# Patient Record
Sex: Male | Born: 1990 | Race: White | Hispanic: No | Marital: Married | Smoking: Former smoker
Health system: Southern US, Community
[De-identification: ages and names within clinical notes are randomized; demographics above are authoritative.]

## PROBLEM LIST (undated history)

## (undated) DIAGNOSIS — K4090 Unilateral inguinal hernia, without obstruction or gangrene, not specified as recurrent: Secondary | ICD-10-CM

## (undated) DIAGNOSIS — N400 Enlarged prostate without lower urinary tract symptoms: Secondary | ICD-10-CM

## (undated) DIAGNOSIS — T8859XA Other complications of anesthesia, initial encounter: Secondary | ICD-10-CM

## (undated) DIAGNOSIS — S43439A Superior glenoid labrum lesion of unspecified shoulder, initial encounter: Secondary | ICD-10-CM

## (undated) HISTORY — PX: HERNIA REPAIR: SHX51

---

## 2016-02-23 ENCOUNTER — Ambulatory Visit (HOSPITAL_COMMUNITY)
Admission: EM | Admit: 2016-02-23 | Discharge: 2016-02-23 | Disposition: A | Discharge status: Home / Self Care | Payer: BLUE CROSS/BLUE SHIELD | Attending: Emergency Medicine | Admitting: Emergency Medicine

## 2016-02-23 ENCOUNTER — Encounter (HOSPITAL_COMMUNITY): Payer: Self-pay | Admitting: Emergency Medicine

## 2016-02-23 DIAGNOSIS — S0181XA Laceration without foreign body of other part of head, initial encounter: Secondary | ICD-10-CM

## 2016-02-23 NOTE — Discharge Instructions (Signed)
Your wound has been closed with dermabond and steri strips. Monitor for signs and symptoms of infection. Keep the wound clean and dry, recommending leaving the bandage in place for at least the next 24 hours. The dermabond should peel off on its on in 5-7 days. Should you develop a fever, or swelling to the area, with notable redness follow up with your primary care provider or return to clinic.

## 2016-02-23 NOTE — ED Triage Notes (Signed)
Pt here for lac below the right eyebrow onset 45 min ago while playing basketball  Reports he was elbowed   Denies LOC  Bleeding controlled  A&O x4... NAD

## 2016-02-23 NOTE — ED Provider Notes (Signed)
CSN: [id number]     Arrival date & time 02/23/16  1402 History   First MD Initiated Contact with Patient 02/23/16 1515     No chief complaint on file.  (Consider location/radiation/quality/duration/timing/severity/associated sxs/prior Treatment) 26 year old male presents with chief complaint of a wound above his right eye. He reports he was playing basket ball and was struck by the elbow of another player. He had no LOC, no dizziness, no amnesia, no headache. Bleeding was controlled with direct pressure.   The history is provided by the patient.    History reviewed. No pertinent past medical history. History reviewed. No pertinent surgical history. History reviewed. No pertinent family history. Social History  Substance Use Topics  . Smoking status: Never Smoker  . Smokeless tobacco: Never Used  . Alcohol use Yes    Review of Systems  Reason unable to perform ROS: As covered in HPI.  All other systems reviewed and are negative.   Allergies  Patient has no known allergies.  Home Medications   Prior to Admission medications   Not on File   Meds Ordered and Administered this Visit  Medications - No data to display  BP 114/69 (BP Location: Left Arm)   Pulse 60   Temp 98.7 F (37.1 C) (Oral)   Resp 16   SpO2 100%  No data found.   Physical Exam  Constitutional: He is oriented to person, place, and time. He appears well-developed and well-nourished. No distress.  Neurological: He is alert and oriented to person, place, and time.  Skin: Skin is warm and dry. Capillary refill takes less than 2 seconds. Laceration noted. He is not diaphoretic.     Psychiatric: He has a normal mood and affect.  Nursing note and vitals reviewed.   Urgent Care Course     .Marland KitchenLaceration Repair Date/Time: 02/23/2016 3:44 PM Performed by: Dorena Bodo Authorized by: Charm Rings   Consent:    Consent obtained:  Verbal   Consent given by:  Patient   Risks discussed:  Infection,  need for additional repair, pain, poor cosmetic result, poor wound healing and vascular damage   Alternatives discussed:  No treatment, delayed treatment and observation Anesthesia (see MAR for exact dosages):    Anesthesia method:  None Laceration details:    Location:  Face   Face location:  R eyebrow   Length (cm):  0.8   Depth (mm):  0.2 Repair type:    Repair type:  Simple Exploration:    Hemostasis achieved with:  Direct pressure   Wound exploration: entire depth of wound probed and visualized     Wound extent: no areolar tissue violation noted, no fascia violation noted, no muscle damage noted and no vascular damage noted     Contaminated: no   Treatment:    Area cleansed with:  Saline   Amount of cleaning:  Standard   Irrigation solution:  Sterile saline   Irrigation volume:  10 ccs   Irrigation method:  Syringe   Visualized foreign bodies/material removed: no   Skin repair:    Repair method:  Tissue adhesive and Steri-Strips   Number of Steri-Strips:  2 Approximation:    Approximation:  Close   Vermilion border: well-aligned   Post-procedure details:    Dressing:  Non-adherent dressing   Patient tolerance of procedure:  Tolerated well, no immediate complications   (including critical care time)  Labs Review Labs Reviewed - No data to display  Imaging Review No results found.  Visual Acuity Review  Right Eye Distance:   Left Eye Distance:   Bilateral Distance:    Right Eye Near:   Left Eye Near:    Bilateral Near:         MDM   1. Laceration of periorbital area, initial encounter   Your wound has been closed with dermabond and steri strips. Monitor for signs and symptoms of infection. Keep the wound clean and dry, recommending leaving the bandage in place for at least the next 24 hours. The dermabond should peel off on its on in 5-7 days. Should you develop a fever, or swelling to the area, with notable redness follow up with your primary care  provider or return to clinic.     Dorena BodoLawrence Malaysia Crance, NP 02/23/16 1546

## 2017-09-21 DIAGNOSIS — M419 Scoliosis, unspecified: Secondary | ICD-10-CM | POA: Insufficient documentation

## 2018-05-29 DIAGNOSIS — N50819 Testicular pain, unspecified: Secondary | ICD-10-CM | POA: Diagnosis not present

## 2018-05-29 DIAGNOSIS — N451 Epididymitis: Secondary | ICD-10-CM | POA: Diagnosis not present

## 2018-06-06 ENCOUNTER — Ambulatory Visit (HOSPITAL_COMMUNITY)
Admission: EM | Admit: 2018-06-06 | Discharge: 2018-06-06 | Disposition: A | Discharge status: Home / Self Care | Payer: BLUE CROSS/BLUE SHIELD | Attending: Family Medicine | Admitting: Family Medicine

## 2018-06-06 ENCOUNTER — Encounter (HOSPITAL_COMMUNITY): Payer: Self-pay | Admitting: Emergency Medicine

## 2018-06-06 ENCOUNTER — Other Ambulatory Visit: Payer: Self-pay

## 2018-06-06 DIAGNOSIS — N50819 Testicular pain, unspecified: Secondary | ICD-10-CM | POA: Diagnosis not present

## 2018-06-06 DIAGNOSIS — N451 Epididymitis: Secondary | ICD-10-CM

## 2018-06-06 MED ORDER — LEVOFLOXACIN 500 MG PO TABS: 500.0000 mg | ORAL_TABLET | Freq: Every day | ORAL | 0 refills | Status: DC

## 2018-06-06 NOTE — ED Triage Notes (Signed)
Pt here for left testicle pain x 4 weeks; pt sts had urine tests that came back normal

## 2018-06-06 NOTE — Discharge Instructions (Addendum)
Stop the doxycycline Start Levaquin daily If not improved by early next week call urology If unable to see Urology, call here

## 2018-06-06 NOTE — ED Provider Notes (Signed)
[facility]    CSN: [id number] Arrival date & time: 06/06/18  1631     History   Chief Complaint Chief Complaint  Patient presents with  . Testicle Pain    HPI Seth Shields is a 28 y.o. male.   HPI  Pleasant 28 year old male.  Here for testicular pain.  He is an Pensions consultantattorney.  Married.  He states that his left testicle is been painful for about 4 weeks.  It hurts with any touching, or rubbing.  No dysuria or frequency.  No rash.  No penile discharge.  No question of STD.  He states he saw another physician 5 days ago who told him he had epididymitis and started him on doxycycline.  After 5 days he states his pain is worse.  No fever chills.  History reviewed. No pertinent past medical history.  There are no active problems to display for this patient.   History reviewed. No pertinent surgical history.     Home Medications    Prior to Admission medications   Medication Sig Start Date End Date Taking? Authorizing Provider  levofloxacin (LEVAQUIN) 500 MG tablet Take 1 tablet (500 mg total) by mouth daily. 06/06/18   Eustace MooreNelson, Marieanne Marxen Sue, MD    Family History History reviewed. No pertinent family history.  Social History Social History   Tobacco Use  . Smoking status: Never Smoker  . Smokeless tobacco: Never Used  Substance Use Topics  . Alcohol use: Yes  . Drug use: Not on file     Allergies   Patient has no known allergies.   Review of Systems Review of Systems  Constitutional: Negative for chills and fever.  HENT: Negative for ear pain and sore throat.   Eyes: Negative for pain and visual disturbance.  Respiratory: Negative for cough and shortness of breath.   Cardiovascular: Negative for chest pain and palpitations.  Gastrointestinal: Negative for abdominal pain and vomiting.  Genitourinary: Positive for testicular pain. Negative for dysuria, frequency and hematuria.  Musculoskeletal: Negative for arthralgias and back pain.  Skin: Negative for  color change and rash.  Neurological: Negative for seizures and syncope.  All other systems reviewed and are negative.    Physical Exam Triage Vital Signs ED Triage Vitals [06/06/18 1706]  Enc Vitals Group     BP 131/76     Pulse Rate 68     Resp 18     Temp 98.4 F (36.9 C)     Temp Source Oral     SpO2 99 %     Weight      Height      Head Circumference      Peak Flow      Pain Score 8     Pain Loc      Pain Edu?      Excl. in GC?    No data found.  Updated Vital Signs BP 131/76 (BP Location: Right Arm)   Pulse 68   Temp 98.4 F (36.9 C) (Oral)   Resp 18   SpO2 99%       Physical Exam Constitutional:      General: He is not in acute distress.    Appearance: He is well-developed.  HENT:     Head: Normocephalic and atraumatic.  Eyes:     Conjunctiva/sclera: Conjunctivae normal.     Pupils: Pupils are equal, round, and reactive to light.  Neck:     Musculoskeletal: Normal range of motion.  Cardiovascular:  Rate and Rhythm: Normal rate.  Pulmonary:     Effort: Pulmonary effort is normal. No respiratory distress.  Abdominal:     General: There is no distension.     Palpations: Abdomen is soft.  Genitourinary:    Penis: Normal.      Scrotum/Testes: Normal.     Comments: Very mild fullness in the upper pole of the left testicle and collecting tubules with no palpable swollen epididymis Musculoskeletal: Normal range of motion.  Skin:    General: Skin is warm and dry.  Neurological:     Mental Status: He is alert.      UC Treatments / Results  Labs (all labs ordered are listed, but only abnormal results are displayed) Labs Reviewed - No data to display  EKG None  Radiology No results found.  Procedures Procedures (including critical care time)  Medications Ordered in UC Medications - No data to display  Initial Impression / Assessment and Plan / UC Course  I have reviewed the triage vital signs and the nursing notes.  Pertinent labs  & imaging results that were available during my care of the patient were reviewed by me and considered in my medical decision making (see chart for details).     Testicle exam is without mass.  No concern for testicular cancer although this is discussed.  No hydrocele palpated.  Minimal epididymal tenderness.  Not responding to doxycycline.. Final Clinical Impressions(s) / UC Diagnoses   Final diagnoses:  Testicular pain  Epididymitis     Discharge Instructions     Stop the doxycycline Start Levaquin daily If not improved by early next week call urology If unable to see Urology, call here   ED Prescriptions    Medication Sig Dispense Auth. Provider   levofloxacin (LEVAQUIN) 500 MG tablet Take 1 tablet (500 mg total) by mouth daily. 10 tablet Eustace Moore, MD     Controlled Substance Prescriptions [place] Controlled Substance Registry consulted? No   Eustace Moore, MD 06/06/18 2042

## 2018-09-08 ENCOUNTER — Encounter: Payer: Self-pay | Admitting: Physician Assistant

## 2018-09-08 ENCOUNTER — Other Ambulatory Visit: Payer: Self-pay

## 2018-09-08 ENCOUNTER — Ambulatory Visit (INDEPENDENT_AMBULATORY_CARE_PROVIDER_SITE_OTHER): Payer: Managed Care, Other (non HMO) | Admitting: Physician Assistant

## 2018-09-08 ENCOUNTER — Other Ambulatory Visit: Payer: Managed Care, Other (non HMO)

## 2018-09-08 ENCOUNTER — Ambulatory Visit (INDEPENDENT_AMBULATORY_CARE_PROVIDER_SITE_OTHER): Payer: Managed Care, Other (non HMO)

## 2018-09-08 VITALS — BP 118/80 | HR 54 | Temp 98.1°F | Ht 72.0 in | Wt 188.0 lb

## 2018-09-08 DIAGNOSIS — R0789 Other chest pain: Secondary | ICD-10-CM | POA: Diagnosis not present

## 2018-09-08 DIAGNOSIS — R9389 Abnormal findings on diagnostic imaging of other specified body structures: Secondary | ICD-10-CM

## 2018-09-08 LAB — COMPREHENSIVE METABOLIC PANEL
ALT: 18 U/L (ref 0–53)
AST: 19 U/L (ref 0–37)
Albumin: 4.9 g/dL (ref 3.5–5.2)
Alkaline Phosphatase: 59 U/L (ref 39–117)
BUN: 17 mg/dL (ref 6–23)
CO2: 29 mEq/L (ref 19–32)
Calcium: 9.9 mg/dL (ref 8.4–10.5)
Chloride: 102 mEq/L (ref 96–112)
Creatinine, Ser: 1.15 mg/dL (ref 0.40–1.50)
GFR: 76.43 mL/min (ref >60.00)
Glucose, Bld: 95 mg/dL (ref 70–99)
Potassium: 5.1 mEq/L (ref 3.5–5.1)
Sodium: 139 mEq/L (ref 135–145)
Total Bilirubin: 0.5 mg/dL (ref 0.2–1.2)
Total Protein: 7.1 g/dL (ref 6.0–8.3)

## 2018-09-08 LAB — CBC WITH DIFFERENTIAL/PLATELET
Basophils Absolute: 0 10*3/uL (ref 0.0–0.1)
Basophils Relative: 0.9 % (ref 0.0–3.0)
Eosinophils Absolute: 0.1 10*3/uL (ref 0.0–0.7)
Eosinophils Relative: 2.4 % (ref 0.0–5.0)
HCT: 45 % (ref 39.0–52.0)
Hemoglobin: 15 g/dL (ref 13.0–17.0)
Lymphocytes Relative: 40 % (ref 12.0–46.0)
Lymphs Abs: 2 10*3/uL (ref 0.7–4.0)
MCHC: 33.3 g/dL (ref 30.0–36.0)
MCV: 89.6 fl (ref 78.0–100.0)
Monocytes Absolute: 0.5 10*3/uL (ref 0.1–1.0)
Monocytes Relative: 9 % (ref 3.0–12.0)
Neutro Abs: 2.4 10*3/uL (ref 1.4–7.7)
Neutrophils Relative %: 47.7 % (ref 43.0–77.0)
Platelets: 234 10*3/uL (ref 150.0–400.0)
RBC: 5.02 Mil/uL (ref 4.22–5.81)
RDW: 12.9 % (ref 11.5–15.5)
WBC: 5 10*3/uL (ref 4.0–10.5)

## 2018-09-08 NOTE — Patient Instructions (Signed)
It was great to meet you!  We will call you as soon as we can with when/where to get the CT scan.  We will also be in touch with your lab results.  Take care,  [name] PA-C

## 2018-09-08 NOTE — Progress Notes (Signed)
Seth BurdockRichard Sherrine Shields is a [age] y.o. male here to Establish Care.  I acted as a Neurosurgeonscribe for Energy East CorporationSamantha Cyntia Staley, PA-C [name], LPN  History of Present Illness:   Chief Complaint  Patient presents with  . Establish Care  . Discuss abnormal lung xrays    Abnormal xray -- patient was seen in the past two weeks by his chiropractor for back pain. They took some xrays and told him that he may have some nodules or other potential abnormalities in his lungs. He reports that he has some issues with feeling like some burning/discomfort when he is active, in his L lung. Denies chest pain or severe SOB. 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 He has since stopped smoking in Feb 2020. Does get night sweats x 1 year. Denies unintentional weight loss, but does feel like he's been more sedentary recently and because of that he has expected to gain weight but he hasn't.  Does have family history of liver cancer. He currently drinks around 6-8 drinks per week.  Health Maintenance: Immunizations -- UTD Colonoscopy -- N/A Mammogram -- N/A Bone Density -- N/A PSA -- N/A Weight -- Weight: 188 lb (85.3 kg)   Depression screen PHQ 2/9 09/08/2018  Decreased Interest 0  Down, Depressed, Hopeless 0  PHQ - 2 Score 0    No flowsheet data found.   Other providers/specialists: Patient Care Team: Seth Shields, [name], [place] as PCP - General (Physician Assistant)   History reviewed. No pertinent past medical history.   Social History   Socioeconomic History  . Marital status: Married    Spouse name: Not on file  . Number of children: Not on file  . Years of education: Not on file  . Highest education level: Not on file  Occupational History  . Not on file  Social Needs  . Financial resource strain: Not on file  . Food insecurity    Worry: Not on file    Inability: Not on file  . Transportation needs    Medical: Not on file    Non-medical: Not on file  Tobacco Use  .  Smoking status: Former Smoker    Packs/day: 0.25    Quit date: 03/31/2018    Years since quitting: 0.4  . Smokeless tobacco: Never Used  . Tobacco comment: x 10 months  Substance and Sexual Activity  . Alcohol use: Yes    Comment: 6/wk Beer and wine  . Drug use: Never  . Sexual activity: Yes  Lifestyle  . Physical activity    Days per week: Not on file    Minutes per session: Not on file  . Stress: Not on file  Relationships  . Social Musicianconnections    Talks on phone: Not on file    Gets together: Not on file    Attends religious service: Not on file    Active member of club or organization: Not on file    Attends meetings of clubs or organizations: Not on file    Relationship status: Not on file  . Intimate partner violence    Fear of current or ex partner: Not on file    Emotionally abused: Not on file    Physically abused: Not on file    Forced sexual activity: Not on file  Other Topics Concern  . Not on file  Social History Narrative  . Not on file    History reviewed. No pertinent surgical history.  Family History  Problem Relation Age of Onset  . Liver cancer Paternal Grandmother   . Liver cancer Paternal Grandfather   . Depression Sister        Manic Depression  . Heart disease Brother     No Known Allergies   Current Medications:   Current Outpatient Medications:  Marland Kitchen  Multiple Vitamin (MULTIVITAMIN) tablet, Take 1 tablet by mouth daily., Disp: , Rfl:    Review of Systems:   ROS  Negative unless otherwise specified per HPI.  Vitals:   Vitals:   09/08/18 0956  BP: 118/80  Pulse: (!) 54  Temp: 98.1 F (36.7 C)  TempSrc: Temporal  SpO2: 99%  Weight: 188 lb (85.3 kg)  Height: 6' (1.829 m)      Body mass index is 25.5 kg/m.  Physical Exam:   Physical Exam Vitals signs and nursing note reviewed.  Constitutional:      General: He is not in acute distress.    Appearance: He is well-developed. He is not ill-appearing or toxic-appearing.   Cardiovascular:     Rate and Rhythm: Regular rhythm. Bradycardia present.     Pulses: Normal pulses.     Heart sounds: Normal heart sounds, S1 normal and S2 normal.     Comments: No LE edema Pulmonary:     Effort: Pulmonary effort is normal.     Breath sounds: Normal breath sounds.  Skin:    General: Skin is warm and dry.  Neurological:     Mental Status: He is alert.     GCS: GCS eye subscore is 4. GCS verbal subscore is 5. GCS motor subscore is 6.  Psychiatric:        Speech: Speech normal.        Behavior: Behavior normal. Behavior is cooperative.     No results found for this or any previous visit.  Assessment and Plan:   [name] was seen today for establish care and discuss abnormal lung xrays.  Diagnoses and all orders for this visit:  Chest discomfort/Abnormal chest xray Unfortunately I am unable to see the formal CXR report from his chiropractor -- record request completed today. We did attempt CT scan of chest, but even when I attempted provider peer-to-peer, I was told by patient's insurance company that there was high call volume and return the call at a later, unspecified date. Will repeat CXR in the office, check CBC and CMP, and determine best plan after these results have returned.  . Reviewed expectations re: course of current medical issues. . Discussed self-management of symptoms. . Outlined signs and symptoms indicating need for more acute intervention. . Patient verbalized understanding and all questions were answered. . See orders for this visit as documented in the electronic medical record. . Patient received an After-Visit Summary.  CMA or LPN served as scribe during this visit. History, Physical, and Plan performed by medical provider. The above documentation has been reviewed and is accurate and complete.   [name], PA-C

## 2018-09-09 ENCOUNTER — Other Ambulatory Visit: Payer: Self-pay | Admitting: *Deleted

## 2018-09-09 ENCOUNTER — Telehealth: Payer: Self-pay | Admitting: Physician Assistant

## 2018-09-09 DIAGNOSIS — R9389 Abnormal findings on diagnostic imaging of other specified body structures: Secondary | ICD-10-CM

## 2018-09-09 DIAGNOSIS — R911 Solitary pulmonary nodule: Secondary | ICD-10-CM

## 2018-09-09 NOTE — Telephone Encounter (Signed)
JoEllen, please contact the patient to advise as you cancelled his appointment.   Copied from [place] [id number]. Topic: General - Other >> Sep 09, 2018 12:30 PM Leward Quan A wrote: Reason for CRM: Patient called to inquire as to why his appointment scheduled for 09/10/2018 was cancelled. He received a My Chart notification with no explanation. Please call patient at Ph# [phone]

## 2018-09-09 NOTE — Telephone Encounter (Signed)
See result notes. 

## 2018-09-09 NOTE — Progress Notes (Signed)
Referral put in Epic.   

## 2018-09-10 ENCOUNTER — Other Ambulatory Visit: Payer: Managed Care, Other (non HMO)

## 2018-09-10 NOTE — Telephone Encounter (Signed)
Called patient was aware spoke with Seth Shields yesterday

## 2018-10-02 ENCOUNTER — Encounter: Payer: Self-pay | Admitting: Emergency Medicine

## 2018-10-02 ENCOUNTER — Ambulatory Visit (INDEPENDENT_AMBULATORY_CARE_PROVIDER_SITE_OTHER): Payer: Managed Care, Other (non HMO) | Admitting: Emergency Medicine

## 2018-10-02 ENCOUNTER — Other Ambulatory Visit: Payer: Self-pay

## 2018-10-02 VITALS — BP 110/80 | HR 66 | Ht 72.0 in | Wt 187.0 lb

## 2018-10-02 DIAGNOSIS — R0602 Shortness of breath: Secondary | ICD-10-CM

## 2018-10-02 DIAGNOSIS — R911 Solitary pulmonary nodule: Secondary | ICD-10-CM | POA: Diagnosis not present

## 2018-10-02 DIAGNOSIS — R0600 Dyspnea, unspecified: Secondary | ICD-10-CM | POA: Insufficient documentation

## 2018-10-02 DIAGNOSIS — R918 Other nonspecific abnormal finding of lung field: Secondary | ICD-10-CM

## 2018-10-02 NOTE — Progress Notes (Signed)
Subjective:    Patient ID: Seth Shields, male    DOB: 1990/04/16, 28 y.o.   MRN: [id number]  HPI 28 year old man, minimal smoking history (<1 pk/yr), little other past medical history.  Referred today for evaluation of an abnormal CXR. 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 No wheeze, no trauma reported.  He has a hx L sided pectus deformity, also had some L sided rib fx's. He is able to exert. No cough or wheeze.   Review of Systems  Constitutional: Negative for fever and unexpected weight change.  HENT: Negative for congestion, dental problem, ear pain, nosebleeds, postnasal drip, rhinorrhea, sinus pressure, sneezing, sore throat and trouble swallowing.   Eyes: Negative for redness and itching.  Respiratory: Positive for shortness of breath. Negative for cough, chest tightness and wheezing.   Cardiovascular: Negative for palpitations and leg swelling.  Gastrointestinal: Negative for nausea and vomiting.  Genitourinary: Negative for dysuria.  Musculoskeletal: Negative for joint swelling.  Skin: Negative for rash.  Neurological: Negative for headaches.  Hematological: Does not bruise/bleed easily.  Psychiatric/Behavioral: Negative for dysphoric mood. The patient is not nervous/anxious.    No past medical history on file.   Family History  Problem Relation Age of Onset  . Liver cancer Paternal Grandmother   . Liver cancer Paternal Grandfather   . Depression Sister        Manic Depression  . Heart disease Brother      Social History   Socioeconomic History  . Marital status: Married    Spouse name: Not on file  . Number of children: Not on file  . Years of education: Not on file  . Highest education level: Not on file  Occupational History  . Not on file  Social Needs  . Financial resource strain: Not on file  . Food insecurity    Worry: Not on file    Inability: Not on file  . Transportation needs   Medical: Not on file    Non-medical: Not on file  Tobacco Use  . Smoking status: Former Smoker    Packs/day: 0.50    Years: 0.75    Pack years: 0.37    Quit date: 03/31/2018    Years since quitting: 0.5  . Smokeless tobacco: Never Used  Substance and Sexual Activity  . Alcohol use: Yes    Comment: 6/wk Beer and wine  . Drug use: Never  . Sexual activity: Yes  Lifestyle  . Physical activity    Days per week: Not on file    Minutes per session: Not on file  . Stress: Not on file  Relationships  . Social Herbalist on phone: Not on file    Gets together: Not on file    Attends religious service: Not on file    Active member of club or organization: Not on file    Attends meetings of clubs or organizations: Not on file    Relationship status: Not on file  . Intimate partner violence    Fear of current or ex partner: Not on file    Emotionally abused: Not on file    Physically abused: Not on file    Forced sexual activity: Not on file  Other Topics Concern  . Not on file  Social History Narrative  . Not on file   From [place], then to [facility].  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 No known TB exposure.   No Known Allergies   Outpatient Medications Prior to Visit  Medication Sig Dispense Refill  . Multiple Vitamin (MULTIVITAMIN) tablet Take 1 tablet by mouth daily.     No facility-administered medications prior to visit.         Objective:   Physical Exam Vitals:   10/02/18 1523  BP: 110/80  Pulse: 66  SpO2: 99%  Weight: 187 lb (84.8 kg)  Height: 6' (1.829 m)   Gen: Pleasant, thin man, in no distress,  normal affect  ENT: No lesions,  mouth clear,  oropharynx clear, no postnasal drip  Neck: No JVD, no stridor  Lungs: No use of accessory muscles, no crackles or wheezing on normal respiration, no wheeze on forced expiration  Cardiovascular: RRR, heart sounds normal, no murmur or gallops, no peripheral edema  Musculoskeletal: No  deformities, no cyanosis or clubbing  Neuro: alert, awake, non focal  Skin: Warm, no lesions or rash       Assessment & Plan:  Solitary pulmonary nodule 6 mm rounded left lower lobe nodule noted on chest x-ray with some associated hilar nodular calcification.  Suspect this is granulomatous disease.  Given its size I think it does need to be better characterized by CT.  He is a low risk patient, minimal tobacco history although he does have second and third-degree relatives who had lung cancer.  We may decide to follow with serial films if there is no clear evidence for calcification in the nodule itself.  Dyspnea He gives a good description of some left-sided restrictive physiology.  He does have a focal pectus deformity and had rib fractures on that side before.  He has never had dyspnea due to these findings.  I think he needs pulmonary function testing to better characterize, assess for possible occult obstruction.  Levy Pupaobert Dakai Braithwaite, MD, PhD 10/02/2018, 4:06 PM [facility] [phone] or if no answer [phone]

## 2018-10-02 NOTE — Assessment & Plan Note (Signed)
6 mm rounded left lower lobe nodule noted on chest x-ray with some associated hilar nodular calcification.  Suspect this is granulomatous disease.  Given its size I think it does need to be better characterized by CT.  He is a low risk patient, minimal tobacco history although he does have second and third-degree relatives who had lung cancer.  We may decide to follow with serial films if there is no clear evidence for calcification in the nodule itself.

## 2018-10-02 NOTE — Patient Instructions (Signed)
We will arrange for a CT scan of the chest with contrast to better evaluate your left lower lobe pulmonary nodule We will arrange for pulmonary function testing to further evaluate shortness of breath. Follow with Dr. Lamonte Sakai on the same day as your PFT to review the results together.

## 2018-10-02 NOTE — Assessment & Plan Note (Signed)
He gives a good description of some left-sided restrictive physiology.  He does have a focal pectus deformity and had rib fractures on that side before.  He has never had dyspnea due to these findings.  I think he needs pulmonary function testing to better characterize, assess for possible occult obstruction.

## 2018-10-24 ENCOUNTER — Other Ambulatory Visit: Payer: Managed Care, Other (non HMO)

## 2018-10-24 ENCOUNTER — Other Ambulatory Visit: Payer: Self-pay

## 2018-10-24 ENCOUNTER — Ambulatory Visit (INDEPENDENT_AMBULATORY_CARE_PROVIDER_SITE_OTHER)
Admission: RE | Admit: 2018-10-24 | Discharge: 2018-10-24 | Disposition: A | Discharge status: Home / Self Care | Payer: Managed Care, Other (non HMO) | Source: Ambulatory Visit | Attending: Emergency Medicine | Admitting: Emergency Medicine

## 2018-10-24 DIAGNOSIS — R918 Other nonspecific abnormal finding of lung field: Secondary | ICD-10-CM | POA: Diagnosis not present

## 2018-10-24 MED ORDER — IOHEXOL 300 MG/ML  SOLN
80.0000 mL | Freq: Once | INTRAMUSCULAR | Status: AC | PRN
  Administered 2018-10-24: 80 mL via INTRAVENOUS

## 2018-10-29 ENCOUNTER — Telehealth: Payer: Self-pay | Admitting: Emergency Medicine

## 2018-10-29 NOTE — Telephone Encounter (Signed)
Please let him know that his scan confirms some small scattered calcified granulomas which are benign. There are no worrisome pulmonary nodules or other findings - good news.

## 2018-10-29 NOTE — Telephone Encounter (Signed)
RB can you review CT results.

## 2018-10-29 NOTE — Telephone Encounter (Signed)
ATC pt regarding these results, line went to voicemail. I have left a detailed message per pt's DPR with these results and let pt know to give our office a call back if he has any additional questions or concerns. Nothing further needed at this time.

## 2018-11-13 ENCOUNTER — Other Ambulatory Visit: Payer: Self-pay | Admitting: *Deleted

## 2018-12-02 ENCOUNTER — Other Ambulatory Visit (HOSPITAL_COMMUNITY)
Admission: RE | Admit: 2018-12-02 | Discharge: 2018-12-02 | Disposition: A | Discharge status: Home / Self Care | Payer: Managed Care, Other (non HMO) | Source: Ambulatory Visit | Attending: Emergency Medicine | Admitting: Emergency Medicine

## 2018-12-02 DIAGNOSIS — Z01812 Encounter for preprocedural laboratory examination: Secondary | ICD-10-CM | POA: Insufficient documentation

## 2018-12-02 DIAGNOSIS — Z20828 Contact with and (suspected) exposure to other viral communicable diseases: Secondary | ICD-10-CM | POA: Insufficient documentation

## 2018-12-02 LAB — SARS CORONAVIRUS 2 (TAT 6-24 HRS): SARS Coronavirus 2: NEGATIVE

## 2018-12-03 ENCOUNTER — Telehealth: Payer: Self-pay | Admitting: Emergency Medicine

## 2018-12-03 ENCOUNTER — Other Ambulatory Visit: Payer: Self-pay

## 2018-12-03 ENCOUNTER — Ambulatory Visit (INDEPENDENT_AMBULATORY_CARE_PROVIDER_SITE_OTHER): Payer: Managed Care, Other (non HMO) | Admitting: Emergency Medicine

## 2018-12-03 DIAGNOSIS — R0600 Dyspnea, unspecified: Secondary | ICD-10-CM

## 2018-12-03 LAB — PULMONARY FUNCTION TEST
DL/VA % pred: 102 %
DL/VA: 5.11 ml/min/mmHg/L
DLCO unc % pred: 103 %
DLCO unc: 37.52 ml/min/mmHg
FEF 25-75 Post: 5.03 L/sec
FEF 25-75 Pre: 4.45 L/sec
FEF2575-%Change-Post: 12 %
FEF2575-%Pred-Post: 101 %
FEF2575-%Pred-Pre: 89 %
FEV1-%Change-Post: 5 %
FEV1-%Pred-Post: 101 %
FEV1-%Pred-Pre: 96 %
FEV1-Post: 5 L
FEV1-Pre: 4.76 L
FEV1FVC-%Change-Post: 2 %
FEV1FVC-%Pred-Pre: 97 %
FEV6-%Change-Post: 2 %
FEV6-%Pred-Post: 101 %
FEV6-%Pred-Pre: 98 %
FEV6-Post: 6.09 L
FEV6-Pre: 5.93 L
FEV6FVC-%Pred-Post: 100 %
FEV6FVC-%Pred-Pre: 100 %
FVC-%Change-Post: 2 %
FVC-%Pred-Post: 100 %
FVC-%Pred-Pre: 97 %
FVC-Post: 6.09 L
FVC-Pre: 5.93 L
Post FEV1/FVC ratio: 82 %
Post FEV6/FVC ratio: 100 %
Pre FEV1/FVC ratio: 80 %
Pre FEV6/FVC Ratio: 100 %
RV % pred: 69 %
RV: 1.18 L
TLC % pred: 89 %
TLC: 6.73 L

## 2018-12-03 NOTE — Telephone Encounter (Signed)
Spoke with patient. He stated that he has an appt for a PFT this afternoon at 4pm and wanted to know if his COVID test results came back. Advised him that his results were negative. Patient verbalized understanding and stated that he will be here for the PFT.   Nothing further needed at time of call.

## 2018-12-03 NOTE — Progress Notes (Signed)
Full PFT performed today. °

## 2019-01-12 ENCOUNTER — Encounter: Payer: Self-pay | Admitting: Physician Assistant

## 2019-01-13 ENCOUNTER — Encounter: Payer: Self-pay | Admitting: Physician Assistant

## 2019-01-13 ENCOUNTER — Ambulatory Visit (INDEPENDENT_AMBULATORY_CARE_PROVIDER_SITE_OTHER): Payer: Managed Care, Other (non HMO) | Admitting: Physician Assistant

## 2019-01-13 VITALS — Ht 72.0 in | Wt 185.0 lb

## 2019-01-13 DIAGNOSIS — R41840 Attention and concentration deficit: Secondary | ICD-10-CM

## 2019-01-13 NOTE — Progress Notes (Signed)
Virtual Visit via Video   I connected with Seth Shields on 01/13/19 at  7:40 AM EST by a video enabled telemedicine application and verified that I am speaking with the correct person using two identifiers. Location patient: Home Location provider: [facility], Office Persons participating in the virtual visit: Lin Glazier, [name] PA-C, Anselmo Pickler, LPN   I discussed the limitations of evaluation and management by telemedicine and the availability of in person appointments. The patient expressed understanding and agreed to proceed.  I acted as a Education administrator for [employer], [employer], LPN  Subjective:   HPI:  Discuss ADHD Pt would like to discuss the steps to get tested for ADHD and/or autism spectrum disorder? His wife is currently being screened for ADHD and was given a questionnaire as a part of that process. Looking at that questionnaire, he saw most of the prompts related to issues/symptoms that he is dealing with.   He feels as though he has some anxiety and inattentiveness as well as hyper focus.   ROS: See pertinent positives and negatives per HPI.  Patient Active Problem List   Diagnosis Date Noted  . Solitary pulmonary nodule 10/02/2018  . Dyspnea 10/02/2018    Social History   Tobacco Use  . Smoking status: Former Smoker    Packs/day: 0.50    Years: 0.75    Pack years: 0.37    Quit date: 03/31/2018    Years since quitting: 0.7  . Smokeless tobacco: Never Used  Substance Use Topics  . Alcohol use: Yes    Comment: 6/wk Beer and wine    Current Outpatient Medications:  Marland Kitchen  Multiple Vitamin (MULTIVITAMIN) tablet, Take 1 tablet by mouth daily., Disp: , Rfl:   No Known Allergies  Objective:   VITALS: Per patient if applicable, see vitals. GENERAL: Alert, appears well and in no acute distress. HEENT: Atraumatic, conjunctiva clear, no obvious abnormalities on inspection of external nose and ears. NECK: Normal movements of the head  and neck. CARDIOPULMONARY: No increased WOB. Speaking in clear sentences. I:E ratio WNL.  MS: Moves all visible extremities without noticeable abnormality. PSYCH: Pleasant and cooperative, well-groomed. Speech normal rate and rhythm. Affect is appropriate. Insight and judgement are appropriate. Attention is focused, linear, and appropriate.  NEURO: CN grossly intact. Oriented as arrived to appointment on time with no prompting. Moves both UE equally.  SKIN: No obvious lesions, wounds, erythema, or cyanosis noted on face or hands.  Assessment and Plan:   Udell was seen today for discuss adhd.  Diagnoses and all orders for this visit:  Attention or concentration deficit   Referral to Dr. Rachel Moulds at [facility] for further evaluation.  . Reviewed expectations re: course of current medical issues. . Discussed self-management of symptoms. . Outlined signs and symptoms indicating need for more acute intervention. . Patient verbalized understanding and all questions were answered. Marland Kitchen Health Maintenance issues including appropriate healthy diet, exercise, and smoking avoidance were discussed with patient. . See orders for this visit as documented in the electronic medical record.  I discussed the assessment and treatment plan with the patient. The patient was provided an opportunity to ask questions and all were answered. The patient agreed with the plan and demonstrated an understanding of the instructions.   The patient was advised to call back or seek an in-person evaluation if the symptoms worsen or if the condition fails to improve as anticipated.   CMA or LPN served as Education administrator during this  visit. History, Physical, and Plan performed by medical provider. The above documentation has been reviewed and is accurate and complete.  [place], [place] 01/13/2019

## 2019-01-16 ENCOUNTER — Ambulatory Visit: Payer: Managed Care, Other (non HMO) | Admitting: Physician Assistant

## 2019-01-23 ENCOUNTER — Telehealth: Payer: Managed Care, Other (non HMO) | Admitting: Physician Assistant

## 2019-01-23 DIAGNOSIS — Z20822 Contact with and (suspected) exposure to covid-19: Secondary | ICD-10-CM

## 2019-01-23 MED ORDER — ALBUTEROL SULFATE HFA 108 (90 BASE) MCG/ACT IN AERS: 2.0000 | INHALATION_SPRAY | RESPIRATORY_TRACT | 0 refills | Status: DC | PRN

## 2019-01-23 MED ORDER — BENZONATATE 100 MG PO CAPS
100.0000 mg | ORAL_CAPSULE | Freq: Three times a day (TID) | ORAL | 0 refills | Status: DC | PRN
Start: 2019-01-23 — End: 2019-06-05

## 2019-01-23 NOTE — Progress Notes (Signed)
Records reviewed.  Patient is under the care of pulmonology for dyspnea.  Does not appear that he has exertional dyspnea.  Questionnaire today does not indicate shortness of breath.  Will test for Covid and give strict return precautions.  E-Visit for Corona Virus Screening   Your current symptoms could be consistent with the coronavirus.  We will treat your symptoms.  It is imperative that you seek a face-to-face evaluation if your symptoms worsen including shortness of breath.  Many health care providers can now test patients at their office but not all are.  [place] has multiple testing sites. For information on our COVID testing locations and hours go to [email or web address]  We are enrolling you in our MyChart Home Monitoring for COVID19 . Daily you will receive a questionnaire within the MyChart website. Our COVID 19 response team will be monitoring your responses daily.  Testing Information: The COVID-19 Community Testing sites will begin testing BY APPOINTMENT ONLY.  You can schedule online at [email or web address]  If you do not have access to a smart phone or computer you may call [phone] for an appointment.  Testing Locations: Appointment schedule is 8 am to 3:30 pm at all sites  [facility] indoors at [address], [place] [address] [facility]  indoors at [facility] [address] [place], [place] [address] [place] indoors at [address] [place] [address]  Additional testing sites in the Community:  . For [facility] Testing sites in [facility]  FarmerBuys.com.au  . For Pop-up testing sites in [place]  [email or web address]-[email or web address]  . For Testing sites with regular hours [email or web address]  . For [facility] [email or web address]  . For [facility]  [email or web address].dk  . For [facility] testing in [place] and [employer] [email or web address].dk  . For [facility] testing in [facility]   [email or web address]  For  more information about community testing call [phone]   We are enrolling you in our MyChart Home Monitoring for COVID19 . Daily you will receive a questionnaire within the MyChart website. Our COVID 19 response team will be monitoring your responses daily.  Please quarantine yourself while awaiting your test results. If you develop fever/cough/breathlessness, please stay home for 10 days with improving symptoms and until you have had 24 hours of no fever (without taking a fever reducer).  You should wear a mask or cloth face covering over your nose and mouth if you must be around other people or animals, including pets (even at home). Try to stay at least 6 feet away from other people. This will protect the people around you.  Please continue good preventive care measures, including:  frequent hand-washing, avoid touching your face, cover coughs/sneezes, stay out of crowds and keep a 6 foot distance from others.  COVID-19 is a respiratory illness with symptoms that are similar to the flu. Symptoms are typically mild to moderate, but there have been cases of severe illness and death due to the virus.   The following symptoms may appear 2-14 days after exposure: . Fever . Cough . Shortness of breath or difficulty breathing . Chills . Repeated shaking with chills . Muscle pain . Headache . Sore throat . New loss of taste or smell . Fatigue . Congestion or runny nose . Nausea or vomiting . Diarrhea  Go to the nearest hospital ED for assessment if fever/cough/breathlessness are severe or illness seems like a threat to  life.  It is vitally important that if you feel  that you have an infection such as this virus or any other virus that you stay home and away from places where you may spread it to others.  You should avoid contact with people age 27 and older.   You can use medication such as A prescription cough medication called Tessalon Perles 100 mg. You may take 1-2 capsules every 8 hours as needed for cough and A prescription inhaler called Albuterol MDI 90 mcg /actuation 2 puffs every 4 hours as needed for shortness of breath, wheezing, cough  You may also take acetaminophen (Tylenol) as needed for fever.  Reduce your risk of any infection by using the same precautions used for avoiding the common cold or flu:  Marland Kitchen. Wash your hands often with soap and warm water for at least 20 seconds.  If soap and water are not readily available, use an alcohol-based hand sanitizer with at least 60% alcohol.  . If coughing or sneezing, cover your mouth and nose by coughing or sneezing into the elbow areas of your shirt or coat, into a tissue or into your sleeve (not your hands). . Avoid shaking hands with others and consider head nods or verbal greetings only. . Avoid touching your eyes, nose, or mouth with unwashed hands.  . Avoid close contact with people who are sick. . Avoid places or events with large numbers of people in one location, like concerts or sporting events. . Carefully consider travel plans you have or are making. . If you are planning any travel outside or inside the [place], visit the CDC's Travelers' Health webpage for the latest health notices. . If you have some symptoms but not all symptoms, continue to monitor at home and seek medical attention if your symptoms worsen. . If you are having a medical emergency, call 911.  HOME CARE . Only take medications as instructed by your medical team. . Drink plenty of fluids and get plenty of rest. . A steam or ultrasonic humidifier can help if you have congestion.    GET HELP RIGHT AWAY IF YOU HAVE EMERGENCY WARNING SIGNS** FOR COVID-19. If you or someone is showing any of these signs seek emergency medical care immediately. Call 911 or proceed to your closest emergency facility if: . You develop worsening high fever. . Trouble breathing . Bluish lips or face . Persistent pain or pressure in the chest . New confusion . Inability to wake or stay awake . You cough up blood. . Your symptoms become more severe  **This list is not all possible symptoms. Contact your medical provider for any symptoms that are sever or concerning to you.  MAKE SURE YOU   Understand these instructions.  Will watch your condition.  Will get help right away if you are not doing well or get worse.  Your e-visit answers were reviewed by a board certified advanced clinical practitioner to complete your personal care plan.  Depending on the condition, your plan could have included both over the counter or prescription medications.  If there is a problem please reply once you have received a response from your provider.  Your safety is important to us.  If you have drug allergies check your prescription carefully.    You can use MyChart to ask questions about today's visit, request a non-urgent call back, or ask for a work or school excuse for 24 hours related to this e-Visit. If it has been greater than 24 hours you will need to follow up with your provider, or  enter a new e-Visit to address those concerns. You will get an e-mail in the next two days asking about your experience.  I hope that your e-visit has been valuable and will speed your recovery. Thank you for using e-visits.   Greater than 5 minutes, yet less than 10 minutes of time have been spent researching, coordinating, and implementing care for this patient today

## 2019-04-23 ENCOUNTER — Ambulatory Visit: Payer: Managed Care, Other (non HMO) | Attending: Internal Medicine

## 2019-04-23 DIAGNOSIS — Z23 Encounter for immunization: Secondary | ICD-10-CM

## 2019-04-23 NOTE — Progress Notes (Signed)
   Covid-19 Vaccination Clinic  Name:  Seth Shields    MRN: [id number] DOB: Aug 03, 1990  04/23/2019  Mr. Bierlein was observed post Covid-19 immunization for 15 minutes without incident. He was provided with Vaccine Information Sheet and instruction to access the V-Safe system.   Mr. Holtmeyer was instructed to call 911 with any severe reactions post vaccine: Marland Kitchen Difficulty breathing  . Swelling of face and throat  . A fast heartbeat  . A bad rash all over body  . Dizziness and weakness   Immunizations Administered    Name Date Dose VIS Date Route   Pfizer COVID-19 Vaccine 04/23/2019 10:51 AM 0.3 mL 01/16/2019 Intramuscular   Manufacturer: [employer], [employer]   Lot: [id number]   NDC: [address]-3

## 2019-05-18 ENCOUNTER — Ambulatory Visit: Payer: Managed Care, Other (non HMO) | Attending: Internal Medicine

## 2019-05-18 DIAGNOSIS — Z23 Encounter for immunization: Secondary | ICD-10-CM

## 2019-05-18 NOTE — Progress Notes (Signed)
   Covid-19 Vaccination Clinic  Name:  Seth Shields    MRN: [id number] DOB: 20-Jun-1990  05/18/2019  Mr. [name] was observed post Covid-19 immunization for 15 minutes without incident. He was provided with Vaccine Information Sheet and instruction to access the V-Safe system.   Mr. [name] was instructed to call 911 with any severe reactions post vaccine: Marland Kitchen Difficulty breathing  . Swelling of face and throat  . A fast heartbeat  . A bad rash all over body  . Dizziness and weakness   Immunizations Administered    Name Date Dose VIS Date Route   Pfizer COVID-19 Vaccine 05/18/2019 10:38 AM 0.3 mL 01/16/2019 Intramuscular   Manufacturer: [employer], [employer]   Lot: [id number]   NDC: [address]-8

## 2019-05-27 ENCOUNTER — Encounter: Payer: Self-pay | Admitting: Physician Assistant

## 2019-06-05 ENCOUNTER — Encounter: Payer: Self-pay | Admitting: Physician Assistant

## 2019-06-05 ENCOUNTER — Ambulatory Visit (INDEPENDENT_AMBULATORY_CARE_PROVIDER_SITE_OTHER): Payer: Managed Care, Other (non HMO) | Admitting: Physician Assistant

## 2019-06-05 ENCOUNTER — Other Ambulatory Visit: Payer: Self-pay

## 2019-06-05 VITALS — BP 122/78 | HR 70 | Temp 98.7°F | Ht 72.0 in | Wt 189.0 lb

## 2019-06-05 DIAGNOSIS — R3589 Other polyuria: Secondary | ICD-10-CM

## 2019-06-05 DIAGNOSIS — M25551 Pain in right hip: Secondary | ICD-10-CM

## 2019-06-05 DIAGNOSIS — M545 Low back pain: Secondary | ICD-10-CM | POA: Diagnosis not present

## 2019-06-05 DIAGNOSIS — M25562 Pain in left knee: Secondary | ICD-10-CM

## 2019-06-05 DIAGNOSIS — R358 Other polyuria: Secondary | ICD-10-CM | POA: Diagnosis not present

## 2019-06-05 DIAGNOSIS — G8929 Other chronic pain: Secondary | ICD-10-CM

## 2019-06-05 LAB — COMPREHENSIVE METABOLIC PANEL
ALT: 19 U/L (ref 0–53)
AST: 25 U/L (ref 0–37)
Albumin: 4.9 g/dL (ref 3.5–5.2)
Alkaline Phosphatase: 55 U/L (ref 39–117)
BUN: 17 mg/dL (ref 6–23)
CO2: 28 mEq/L (ref 19–32)
Calcium: 9.7 mg/dL (ref 8.4–10.5)
Chloride: 105 mEq/L (ref 96–112)
Creatinine, Ser: 1.31 mg/dL (ref 0.40–1.50)
GFR: 65.4 mL/min (ref >60.00)
Glucose, Bld: 99 mg/dL (ref 70–99)
Potassium: 5.1 mEq/L (ref 3.5–5.1)
Sodium: 140 mEq/L (ref 135–145)
Total Bilirubin: 0.7 mg/dL (ref 0.2–1.2)
Total Protein: 7 g/dL (ref 6.0–8.3)

## 2019-06-05 LAB — CBC WITH DIFFERENTIAL/PLATELET
Basophils Absolute: 0.1 10*3/uL (ref 0.0–0.1)
Basophils Relative: 0.9 % (ref 0.0–3.0)
Eosinophils Absolute: 0 10*3/uL (ref 0.0–0.7)
Eosinophils Relative: 0.6 % (ref 0.0–5.0)
HCT: 41.5 % (ref 39.0–52.0)
Hemoglobin: 14 g/dL (ref 13.0–17.0)
Lymphocytes Relative: 32.2 % (ref 12.0–46.0)
Lymphs Abs: 2.4 10*3/uL (ref 0.7–4.0)
MCHC: 33.7 g/dL (ref 30.0–36.0)
MCV: 88.8 fl (ref 78.0–100.0)
Monocytes Absolute: 0.6 10*3/uL (ref 0.1–1.0)
Monocytes Relative: 8.5 % (ref 3.0–12.0)
Neutro Abs: 4.2 10*3/uL (ref 1.4–7.7)
Neutrophils Relative %: 57.8 % (ref 43.0–77.0)
Platelets: 289 10*3/uL (ref 150.0–400.0)
RBC: 4.67 Mil/uL (ref 4.22–5.81)
RDW: 12.8 % (ref 11.5–15.5)
WBC: 7.3 10*3/uL (ref 4.0–10.5)

## 2019-06-05 LAB — URINALYSIS, ROUTINE W REFLEX MICROSCOPIC
Bilirubin Urine: NEGATIVE
Hgb urine dipstick: NEGATIVE
Ketones, ur: NEGATIVE
Leukocytes,Ua: NEGATIVE
Nitrite: NEGATIVE
RBC / HPF: NONE SEEN (ref 0–?)
Specific Gravity, Urine: 1.02 (ref 1.000–1.030)
Total Protein, Urine: NEGATIVE
Urine Glucose: NEGATIVE
Urobilinogen, UA: 0.2 (ref 0.0–1.0)
WBC, UA: NONE SEEN (ref 0–?)
pH: 8 (ref 5.0–8.0)

## 2019-06-05 LAB — TSH: TSH: 2.53 u[IU]/mL (ref 0.35–4.50)

## 2019-06-05 NOTE — Patient Instructions (Signed)
It was great to see you!  A referral has been placed for you to see Dr. [name] or Dr. Terrilee Files with [facility]. Someone from there office will be in touch soon regarding your appointment with him. His location: Psychiatrist Medicine at [facility] [address] on the 1st floor.   Phone number [phone], Fax [phone].  This location is across the street from the entrance to [employer] and in the same complex as the [facility] and [facility] [facility]  I will be in touch via MyChart regarding your labs and urine results.  Due to recent changes in healthcare laws, you may see the results of your imaging and laboratory studies on MyChart before your provider has had a chance to review them.  We understand that in some cases there may be results that are confusing or concerning to you. Not all laboratory results come back in the same time frame and the provider may be waiting for multiple results in order to interpret others.  Please give [place] 48 hours in order for your provider to thoroughly review all the results before contacting the office for clarification of your results.   Take care,  Jarold Motto PA-C

## 2019-06-05 NOTE — Progress Notes (Signed)
Seth Shields is a 29 y.o. male here for a new problem.  I acted as a Neurosurgeon for [employer], PA-C [employer], [place]  History of Present Illness:   Chief Complaint  Patient presents with  . Back Pain  . 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 Hx of hypermobile joints. All below MSK issues improve with activity and worsen with lack of activity.  Back pain Pt c/o of mid back pain on the right side with tightness that goes up into shoulder and neck. Seen chiropractor in the past a few times for adjustments, but symptoms will return.   Knee Pain Pt has to pop left knee to relief pain -- pops 7-10 times day. No prior L knee injury. Will feel tightness/tension in his L knee if it hasn't popped or needs to be popped. Denies L lower leg numbness/tingling.  Hip pain R hip pain. Loud popping sensation. Feels like his R hip higher than L hip. Has tried chiropractor treatment for this without improvement. When squatting feels like his R hip may give out. R glute/hamstring also feel very tight. States that his R hip and knee are more flexible than the L but they also feel more unstable. Does get numbness/tingling of his R lower leg if it falls asleep (which L lower leg does not experience.)   Motrin prn.  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 Drinks a lot of water throughout the day, drank 150 oz of fluids yesterday. Has tried to increase his fluids due to stimulant rx. Feels really thirsty throughout the day despite adequate hydration. Denies family hx of DM.   Urinary frequency has been worsening in the past 3 months. Urinates in the middle of the night only because his dog wakes him up regularly around 3a. After he urinates he feels like he has to soon urinate again, as if he didn't empty his bladder all the way. Denies blood in urination. Prior hx of kidney stones -- at age 38 and then 2.  On average  drinks 1 ETOH serving per day, denies any excessive intake. Denies incontinence or unintentional weight changes.   History reviewed. No pertinent past medical history.   Social History   Socioeconomic History  . Marital status: Married    Spouse name: Not on file  . Number of children: Not on file  . Years of education: Not on file  . Highest education level: Not on file  Occupational History  . Not on file  Tobacco Use  . Smoking status: Former Smoker    Packs/day: 0.50    Years: 0.75    Pack years: 0.37    Quit date: 03/31/2018    Years since quitting: 1.1  . Smokeless tobacco: Never Used  Substance and Sexual Activity  . Alcohol use: Yes    Comment: 6/wk Beer and wine  . Drug use: Never  . Sexual activity: Yes  Other Topics Concern  . Not on file  Social History Narrative  . Not on file   Social Determinants of Health   Financial Resource Strain:   . Difficulty of Paying Living Expenses:   Food Insecurity:   . Worried About Programme researcher, broadcasting/film/video in the Last Year:   . Barista in the Last Year:   Transportation Needs:   . Freight forwarder (Medical):   Marland Kitchen Lack of Transportation (Non-Medical):  Physical Activity:   . Days of Exercise per Week:   . Minutes of Exercise per Session:   Stress:   . Feeling of Stress :   Social Connections:   . Frequency of Communication with Friends and Family:   . Frequency of Social Gatherings with Friends and Family:   . Attends Religious Services:   . Active Member of Clubs or Organizations:   . Attends Banker Meetings:   Marland Kitchen Marital Status:   Intimate Partner Violence:   . Fear of Current or Ex-Partner:   . Emotionally Abused:   Marland Kitchen Physically Abused:   . Sexually Abused:     History reviewed. No pertinent surgical history.  Family History  Problem Relation Age of Onset  . Liver cancer Paternal Grandmother   . Liver cancer Paternal Grandfather   . Depression Sister        Manic Depression  .  Heart disease Brother     No Known Allergies  Current Medications:   Current Outpatient Medications:  .  Amphetamine ER (ADZENYS XR-ODT) 18.8 MG TBED, Take by mouth daily., Disp: , Rfl:  .  Biotin 1 MG CAPS, Take by mouth., Disp: , Rfl:  .  Ferrous Gluconate (IRON 27 PO), Take by mouth., Disp: , Rfl:  .  Multiple Vitamin (MULTIVITAMIN) tablet, Take 1 tablet by mouth daily., Disp: , Rfl:    Review of Systems:   ROS  Negative unless otherwise specified per HPI.  Vitals:   Vitals:   06/05/19 0910  BP: 122/78  Pulse: 70  Temp: 98.7 F (37.1 C)  TempSrc: Temporal  SpO2: 99%  Weight: 189 lb (85.7 kg)  Height: 6' (1.829 m)     Body mass index is 25.63 kg/m.  Physical Exam:   Physical Exam Vitals and nursing note reviewed.  Constitutional:      General: He is not in acute distress.    Appearance: He is well-developed. He is not ill-appearing or toxic-appearing.  Cardiovascular:     Rate and Rhythm: Normal rate and regular rhythm.     Pulses: Normal pulses.     Heart sounds: Normal heart sounds, S1 normal and S2 normal.     Comments: No LE edema Pulmonary:     Effort: Pulmonary effort is normal.     Breath sounds: Normal breath sounds.  Musculoskeletal:     Comments: Back exam: No decreased ROM 2/2 pain with flexion/extension, lateral side bends, or rotation. Reproducible tenderness with deep palpation to bilateral paraspinal muscles in upper lumbar/lower thoracic area.   L knee exam: FROM; no obvious swelling or deformity  R hip exam: Decreased FROM with extension of R leg (compared to L)   Skin:    General: Skin is warm and dry.  Neurological:     Mental Status: He is alert.     GCS: GCS eye subscore is 4. GCS verbal subscore is 5. GCS motor subscore is 6.  Psychiatric:        Speech: Speech normal.        Behavior: Behavior normal. Behavior is cooperative.       Assessment and Plan:   Jkai was seen today for back pain and knee pain.  Diagnoses  and all orders for this visit:  Polyuria Unclear etiology. He is drinking a significant amount of water. Will check labs and urine for organic etiology of symptoms. Follow-up based upon lab results. Consider urology referral. -     CBC with Differential/Platelet -  Comprehensive metabolic panel -     TSH -     Urinalysis, Routine w reflex microscopic -     Urine Culture  Chronic bilateral low back pain without sciatica; Right hip pain; Left knee pain He has not had great success with chiropractic treatment. Will refer to sports medicine for further evaluation and management. No red flags on exam. Will defer imaging to specialist. -     Ambulatory referral to Sports Medicine  . Reviewed expectations re: course of current medical issues. . Discussed self-management of symptoms. . Outlined signs and symptoms indicating need for more acute intervention. . Patient verbalized understanding and all questions were answered. . See orders for this visit as documented in the electronic medical record. . Patient received an After-Visit Summary.  CMA or LPN served as scribe during this visit. History, Physical, and Plan performed by medical provider. The above documentation has been reviewed and is accurate and complete.  [name], PA-C

## 2019-06-06 LAB — URINE CULTURE
MICRO NUMBER:: 10425944
Result:: NO GROWTH
SPECIMEN QUALITY:: ADEQUATE

## 2019-06-11 ENCOUNTER — Encounter: Payer: Self-pay | Admitting: Family Medicine

## 2019-06-11 ENCOUNTER — Ambulatory Visit (INDEPENDENT_AMBULATORY_CARE_PROVIDER_SITE_OTHER): Payer: Managed Care, Other (non HMO) | Admitting: Family Medicine

## 2019-06-11 ENCOUNTER — Other Ambulatory Visit: Payer: Self-pay

## 2019-06-11 VITALS — BP 132/70 | HR 87 | Ht 72.0 in | Wt 189.0 lb

## 2019-06-11 DIAGNOSIS — M25562 Pain in left knee: Secondary | ICD-10-CM

## 2019-06-11 DIAGNOSIS — M7061 Trochanteric bursitis, right hip: Secondary | ICD-10-CM | POA: Diagnosis not present

## 2019-06-11 DIAGNOSIS — M546 Pain in thoracic spine: Secondary | ICD-10-CM

## 2019-06-11 DIAGNOSIS — G8929 Other chronic pain: Secondary | ICD-10-CM

## 2019-06-11 MED ORDER — PENNSAID 2 % EX SOLN: 2.0000 g | Freq: Two times a day (BID) | CUTANEOUS | 3 refills | Status: DC

## 2019-06-11 NOTE — Progress Notes (Signed)
Subjective:    I'm seeing this patient as a consultation for:  Rinaldo Cloud, [place]. Note will be routed back to referring provider/PCP.  CC: R hip, mid-back and L knee pain  I, Kana Thompson, LAT, ATC, am serving as scribe for Dr. [name].  HPI: Pt is a 29 y/o male presenting w/ multiple c/o including R hip,, right shoulder mid-back and L knee pain. Patient states these issues are chronic. States he has had no success with the chiropractor. Back and shoulder is most painful today. States he does get numbness in the right arm if the shoulder is tight. Sometimes he has numbness in the right leg as well.   R hip: Hamstring, lateral, glut  -Mechanical symptoms: yes -Aggravating factors: squats that cause feeling of instability, stretching (glut).  -Treatments tried: chiropractor  Mid-back: Pain radiating to neck and R shoulder -Aggravating factors: no stretching  -Treatments tried: chiropractor   L knee: Pt reports feeling pressure/tension in his L knee and feeling that his L knee needs to pop on consistent basis and when it pops he feels relief in terms of L knee pain.  No MOI. No pain just a lot of popping.  -Swelling: no swelling. -Mechanical symptoms: Yes and knee pain relieved when L knee pops -Aggravating factors:  -Treatments tried:  Past medical history, Surgical history, Family history, Social history, Allergies, and medications have been entered into the medical record, reviewed.   Review of Systems: No new headache, visual changes, nausea, vomiting, diarrhea, constipation, dizziness, abdominal pain, skin rash, fevers, chills, night sweats, weight loss, swollen lymph nodes, body aches, joint swelling, muscle aches, chest pain, shortness of breath, mood changes, visual or auditory hallucinations.   Objective:    Vitals:   06/11/19 0830  BP: 132/70  Pulse: 87  SpO2: 98%   General: Well Developed, well nourished, and in no acute distress.   MSK:  C-spine  normal-appearing nontender normal cervical motion. Upper extremity strength reflexes and sensation are intact. C-spine normal-appearing not particular tender palpation midline. Tender palpation right thoracic paraspinal musculature inferior to rhomboid. Normal scapular motion. L-spine nontender midline.  Right hip normal motion. Tender palpation greater trochanter. Nontender otherwise. Hip abduction strength diminished 4/5 with mild pain. Hip external rotation strength diminished 4/5 without significant pain. Internal rotation and adduction strength intact.  Left hip nontender normal motion. Hip abduction strength and external rotation strength slightly diminished 4/5-4+/5. Internal rotation adduction strength intact.  Left knee no effusion. Range of motion 0-120 degrees with minimal crepitation. Not particular tender to palpation. Stable ligamentous exam. Negative McMurray's test.  Right knee no effusion normal range of motion with tiny crepitation. Nontender. Stable ligamentous exam. Negative Murray's test.  Lab and Radiology Results X-ray images left knee ordered today and will be done at a later time and date.  Impression and Recommendations:    Assessment and Plan: 29 y.o. male with  Thoracic back pain ongoing for months or longer.  Pain focused at thoracic paraspinal musculature.  Patient reports that he had x-rays of his T-spine about a year ago at the chiropractor.  Doubtful that he has any acute findings that we will see on x-ray today.  Pain due to paraspinal muscle spasm and dysfunction.  Plan for physical therapy heating pad and TENS unit.    Recheck 6 weeks.  Right hip pain: Tender at greater trochanter with weak hip abductors external rotators.  Hip abductor tendinopathy versus trochanteric bursitis.  Plan for home exercise program today and physical  therapy referral.  Recheck 6 weeks.  Left knee pain: Less clear etiology.  X-ray ordered but not obtained by  patient.  Trial of Pennsaid and a bit of physical therapy.  Recheck 6 weeks.   PDMP not reviewed this encounter. Orders Placed This Encounter  Procedures  . DG Knee AP/LAT W/Sunrise Left    Standing Status:   Future    Standing Expiration Date:   08/10/2020    Order Specific Question:   Reason for Exam (SYMPTOM  OR DIAGNOSIS REQUIRED)    Answer:   eval left knee pain    Order Specific Question:   Preferred imaging location?    Answer:   [facility]    Order Specific Question:   Radiology Contrast Protocol - do NOT remove file path    Answer:   [email or web address]  . Ambulatory referral to Physical Therapy    Referral Priority:   Routine    Referral Type:   Physical Medicine    Referral Reason:   Specialty Services Required    Requested Specialty:   Physical Therapy   Meds ordered this encounter  Medications  . DISCONTD: Diclofenac Sodium (PENNSAID) 2 % SOLN    Sig: Place 2 g onto the skin 2 (two) times daily.    Dispense:  112 g    Refill:  3  . Diclofenac Sodium (PENNSAID) 2 % SOLN    Sig: Place 2 g onto the skin 2 (two) times daily.    Dispense:  112 g    Refill:  3    [phone]    Discussed warning signs or symptoms. Please see discharge instructions. Patient expresses understanding.   The above documentation has been reviewed and is accurate and complete Lynne Leader

## 2019-06-11 NOTE — Patient Instructions (Signed)
Thank you for coming in today. Attend PT.  Use heat and TENS unit.  Pennsaid for the knee.  Recheck 6 weeks.   TENS UNIT: This is helpful for muscle pain and spasm.   Search and Purchase a TENS 7000 2nd edition at  www.tenspros.com or www.Amazon.com It should be less than $30.     TENS unit instructions: Do not shower or bathe with the unit on Turn the unit off before removing electrodes or batteries If the electrodes lose stickiness add a drop of water to the electrodes after they are disconnected from the unit and place on plastic sheet. If you continued to have difficulty, call the TENS unit company to purchase more electrodes. Do not apply lotion on the skin area prior to use. Make sure the skin is clean and dry as this will help prolong the life of the electrodes. After use, always check skin for unusual red areas, rash or other skin difficulties. If there are any skin problems, does not apply electrodes to the same area. Never remove the electrodes from the unit by pulling the wires. Do not use the TENS unit or electrodes other than as directed. Do not change electrode placement without consultating your therapist or physician. Keep 2 fingers with between each electrode. Wear time ratio is 2:1, on to off times.    For example on for 30 minutes off for 15 minutes and then on for 30 minutes off for 15 minutes

## 2019-06-29 ENCOUNTER — Ambulatory Visit: Payer: Managed Care, Other (non HMO) | Attending: Family Medicine

## 2019-06-29 ENCOUNTER — Other Ambulatory Visit: Payer: Self-pay

## 2019-06-29 DIAGNOSIS — M25551 Pain in right hip: Secondary | ICD-10-CM | POA: Diagnosis present

## 2019-06-29 DIAGNOSIS — M545 Low back pain: Secondary | ICD-10-CM | POA: Diagnosis present

## 2019-06-29 DIAGNOSIS — M25562 Pain in left knee: Secondary | ICD-10-CM

## 2019-06-29 DIAGNOSIS — M62830 Muscle spasm of back: Secondary | ICD-10-CM | POA: Diagnosis present

## 2019-06-29 DIAGNOSIS — R293 Abnormal posture: Secondary | ICD-10-CM | POA: Diagnosis present

## 2019-06-29 DIAGNOSIS — G8929 Other chronic pain: Secondary | ICD-10-CM | POA: Diagnosis present

## 2019-06-29 NOTE — Patient Instructions (Signed)
PEC supine exercise with PPT hamstring recruitment and  LT leg lift  With hold 5 breaths 5 reps 2x/day

## 2019-06-29 NOTE — Addendum Note (Signed)
Addended by: Caprice Red on: 06/29/2019 01:26 PM   Modules accepted: Orders

## 2019-06-29 NOTE — Therapy (Signed)
[place], [place], [address] Phone: [phone]   Fax:  [phone]  Physical Therapy Evaluation  Patient Details  Name: Seth Shields MRN: [id number] Date of Birth: Oct 03, 1990 Referring Provider (PT): Lynne Leader, MD   Encounter Date: 06/29/2019  PT End of Session - 06/29/19 1230    Visit Number  1    Number of Visits  12    Date for PT Re-Evaluation  08/07/19    PT Start Time  1220    PT Stop Time  1300    PT Time Calculation (min)  40 min    Activity Tolerance  Patient tolerated treatment well    Behavior During Therapy  [facility] for tasks assessed/performed       History reviewed. No pertinent past medical history.  History reviewed. No pertinent surgical history.  There were no vitals filed for this visit.   Subjective Assessment - 06/29/19 1223    Subjective  He reports multiple areas of pain. No injury. He does band exercises issued from chiropractor    Limitations  --   working out   How long can you sit comfortably?  < 2 hours    How long can you stand comfortably?  2 hours    Patient Stated Goals  He wants to decr pain and improvem flexibility    Currently in Pain?  Yes    Pain Location  Back    Pain Orientation  Right;Mid    Pain Descriptors / Indicators  Aching;Tightness    Pain Type  Chronic pain    Pain Radiating Towards  to neck    Pain Onset  More than a month ago    Pain Frequency  Intermittent   stretching lose  but returns quickly.in hour   Aggravating Factors   static positons    Pain Relieving Factors  stretching ,  activity    Multiple Pain Sites  No    Pain Score  0    Pain Location  Hip         [facility] PT Assessment - 06/29/19 0001      Assessment   Medical Diagnosis  knee back and hip pain    Referring Provider (PT)  Lynne Leader, MD    Onset Date/Surgical Date  --   moe than a year   Next MD Visit  a couple of weeks    Prior Therapy  chiropractic care       Precautions   Precautions  None      Restrictions   Weight Bearing Restrictions  No      Balance Screen   Has the patient fallen in the past 6 months  No      Prior Function   Level of Independence  Independent      Cognition   Overall Cognitive Status  Within Functional Limits for tasks assessed      Posture/Postural Control   Posture Comments  Lt sholder slight elevated n  more posterior  sway back       ROM / Strength   AROM / PROM / Strength  AROM;PROM;Strength      AROM   AROM Assessment Site  Lumbar    Lumbar Flexion  80    Lumbar Extension  full     Lumbar - Right Side Bend  20    Lumbar - Left Side Bend  25      PROM   Overall PROM Comments  RT hip  IR 20 degrees   LT 30   LT hip ER   40 degrees   RT 80      Strength   Overall Strength Comments  4/5 RTR scAPULA RETRACTION.     Strength Assessment Site  Knee;Hip    Right/Left Hip  Right;Left    Right/Left Knee  Right;Left      Flexibility   Soft Tissue Assessment /Muscle Length  yes    Hamstrings  LT 55   RT 50 degrees  and bilateral hip ext decr    Quadriceps  Lt quad 15 degrees less in prone       ITB  LT -  RT ? +      Palpation   Spinal mobility  good mobility  with PA glides    Palpation comment  RT ribs falred                   Objective measurements completed on examination: See above findings.              PT Education - 06/29/19 1229    Education Details  POC, HEP    Person(s) Educated  Patient    Methods  Explanation;Tactile cues;Verbal cues;Handout    Comprehension  Verbalized understanding;Returned demonstration       PT Short Term Goals - 06/29/19 1318      PT SHORT TERM GOAL #1   Title  He will be independent with inital HEP    Time  3    Period  Weeks    Status  New      PT SHORT TERM GOAL #2   Title  He will report pain in back decr  25% or more    Time  3    Period  Weeks    Status  Revised        PT Long Term Goals - 06/29/19 1318      PT LONG  TERM GOAL #1   Title  He will be independent with all HEP issued    Time  6    Period  Weeks    Status  New      PT LONG TERM GOAL #2   Title  He will report pain in back as intermittant    Time  6    Period  Weeks    Status  New      PT LONG TERM GOAL #3   Title  He will report pain  in local area with out radiation to other areas of spine/neck    Time  6    Period  Weeks    Status  New      PT LONG TERM GOAL #4   Title  He will report able to stand and sit without pain    Time  6    Period  Weeks    Status  New             Plan - 06/29/19 1311    Clinical Impression Statement  Mr Seth Shields presents with chronic RT sided back pain and intermittant RT hip pain . He did not report any knee pain.    He demo some asyymetry with flared RT rib cage   Lt hip ER tightness , LT quad tightness , swayback posture , LT scapula more posterior RT sidebending decr.   He was only mildly tender today on RT  paraspinals.    He should benefit from skilled PT and a  consistent HEP.    Personal Factors and Comorbidities  Time since onset of injury/illness/exacerbation    Examination-Activity Limitations  Sit;Stand    Examination-Participation Restrictions  Community Activity    Stability/Clinical Decision Making  Stable/Uncomplicated    Clinical Decision Making  Low    Rehab Potential  Good    PT Treatment/Interventions  Dry needling;Passive range of motion;Patient/family education;Manual techniques;Therapeutic exercise;Therapeutic activities;Electrical Stimulation;Moist Heat    PT Next Visit Plan  check squat, review HEP .,  initiate stretching for HEP, modalities if needed    PT Home Exercise Plan  PEC supine exercise with LT leg lift    Consulted and Agree with Plan of Care  Patient       Patient will benefit from skilled therapeutic intervention in order to improve the following deficits and impairments:  Pain, Postural dysfunction, Increased muscle spasms, Decreased range of  motion  Visit Diagnosis: Left knee pain, unspecified chronicity  Chronic right-sided low back pain without sciatica  Pain in right hip  Abnormal posture  Muscle spasm of back     Problem List Patient Active Problem List   Diagnosis Date Noted  . Solitary pulmonary nodule 10/02/2018  . Dyspnea 10/02/2018  . Scoliosis deformity of spine 09/21/2017    Caprice Red  PT 06/29/2019, 1:21 PM  [facility] [address] [place], [place], [address] Phone: [phone]   Fax:  [phone]  Name: Seth Shields MRN: [id number] Date of Birth: [birth year]

## 2019-07-14 ENCOUNTER — Ambulatory Visit: Payer: Managed Care, Other (non HMO) | Attending: Family Medicine

## 2019-07-14 ENCOUNTER — Other Ambulatory Visit: Payer: Self-pay

## 2019-07-14 DIAGNOSIS — M25551 Pain in right hip: Secondary | ICD-10-CM | POA: Diagnosis present

## 2019-07-14 DIAGNOSIS — G8929 Other chronic pain: Secondary | ICD-10-CM | POA: Insufficient documentation

## 2019-07-14 DIAGNOSIS — M62830 Muscle spasm of back: Secondary | ICD-10-CM

## 2019-07-14 DIAGNOSIS — M545 Low back pain, unspecified: Secondary | ICD-10-CM

## 2019-07-14 DIAGNOSIS — R293 Abnormal posture: Secondary | ICD-10-CM | POA: Diagnosis present

## 2019-07-14 NOTE — Patient Instructions (Signed)
Full squat with reach and deep breathing 1-2x/day  10 sec or 5 breaths 5 reps

## 2019-07-14 NOTE — Therapy (Addendum)
[place], [place], [address] Phone: [phone]   Fax:  [phone]  Physical Therapy Treatment/Dishcharge  Patient Details  Name: Seth Shields MRN: [id number] Date of Birth: July 10, 1990 Referring Provider (PT): Lynne Leader, MD   Encounter Date: 07/14/2019  PT End of Session - 07/14/19 1219    Visit Number  2    Number of Visits  12    Date for PT Re-Evaluation  08/07/19    PT Start Time  1219    PT Stop Time  1300    PT Time Calculation (min)  41 min    Activity Tolerance  Patient tolerated treatment well    Behavior During Therapy  [facility] for tasks assessed/performed       History reviewed. No pertinent past medical history.  History reviewed. No pertinent surgical history.  There were no vitals filed for this visit.  Subjective Assessment - 07/14/19 1219    Subjective  mid back tightness no pain. Exercises done without problem.    Currently in Pain?  No/denies                        [facility] Adult PT Treatment/Exercise - 07/14/19 0001      Exercises   Exercises  Lumbar      Lumbar Exercises: Stretches   Quadruped Mid Back Stretch  2 reps;20 seconds    Quadruped Mid Back Stretch Limitations  flexed the side reach RT and LT.       Lumbar Exercises: Aerobic   Nustep  L5 5 min UE and LE      Lumbar Exercises: Standing   Other Standing Lumbar Exercises  flexion stretch with sidebend at freemotion standing  stretching with deep breathing   with full squat and reach      Lumbar Exercises: Prone   Other Prone Lumbar Exercises  scap retract with arm extensioln and over head lifts x 12 reps      Manual Therapy   Manual Therapy  Soft tissue mobilization;Joint mobilization    Joint Mobilization  Gr 3-4  PA glides thoracic and  lumbar and rotational mobs to lower thoracic spine             PT Education - 07/14/19 1306    Education Details  HEP    Person(s) Educated  Patient     Methods  Explanation;Demonstration;Tactile cues;Verbal cues;Handout    Comprehension  Returned demonstration;Verbalized understanding       PT Short Term Goals - 07/14/19 1309      PT SHORT TERM GOAL #1   Title  He will be independent with inital HEP    Status  Achieved        PT Long Term Goals - 06/29/19 1318      PT LONG TERM GOAL #1   Title  He will be independent with all HEP issued    Time  6    Period  Weeks    Status  New      PT LONG TERM GOAL #2   Title  He will report pain in back as intermittant    Time  6    Period  Weeks    Status  New      PT LONG TERM GOAL #3   Title  He will report pain  in local area with out radiation to other areas of spine/neck    Time  6    Period  Weeks    Status  New      PT LONG TERM GOAL #4   Title  He will report able to stand and sit without pain    Time  6    Period  Weeks    Status  New            Plan - 07/14/19 1307    Clinical Impression Statement  He is able to do all HEP and he reports frequently doing foam roller and  quadraped stretching .Marland Kitchen  No change so far.   Will prgress core strength maybe some bands    PT Treatment/Interventions  Dry needling;Passive range of motion;Patient/family education;Manual techniques;Therapeutic exercise;Therapeutic activities;Electrical Stimulation;Moist Heat    PT Next Visit Plan  check squat/ review HEP ., , modalities if needed, core strength    PT Home Exercise Plan  PEC supine exercise with LT leg lift, full squat with reaching    Consulted and Agree with Plan of Care  Patient       Patient will benefit from skilled therapeutic intervention in order to improve the following deficits and impairments:  Pain, Postural dysfunction, Increased muscle spasms, Decreased range of motion  Visit Diagnosis: Chronic right-sided low back pain without sciatica  Pain in right hip  Abnormal posture  Muscle spasm of back     Problem List Patient Active Problem List    Diagnosis Date Noted  . Solitary pulmonary nodule 10/02/2018  . Dyspnea 10/02/2018  . Scoliosis deformity of spine 09/21/2017    Seth Shields  PT 07/14/2019, 1:24 PM  [facility] [address] [place], [place], [address] Phone: [phone]   Fax:  [phone]  Name: Seth Shields MRN: [id number] Date of Birth: 30-Sep-1990  PHYSICAL THERAPY DISCHARGE SUMMARY  Visits from Start of Care: 2  Current functional level related to goals / functional outcomes: Mr Hymas called to cancel all appointments as work schedule limits ability to come in and he stated his back was feeling better.    Remaining deficits: Cointnined with LBP but improved   Education / Equipment: EP Plan: Patient agrees to discharge.  Patient goals were not met. Patient is being discharged due to the patient's request.  ?????    Seth Shields PT  07/20/19

## 2019-07-16 ENCOUNTER — Ambulatory Visit: Payer: Managed Care, Other (non HMO) | Admitting: Physical Therapy

## 2019-07-21 ENCOUNTER — Ambulatory Visit: Payer: Managed Care, Other (non HMO) | Admitting: Physical Therapy

## 2019-07-23 ENCOUNTER — Ambulatory Visit: Payer: Managed Care, Other (non HMO) | Admitting: Family Medicine

## 2019-07-23 ENCOUNTER — Ambulatory Visit: Payer: Managed Care, Other (non HMO)

## 2019-08-04 ENCOUNTER — Encounter: Payer: Managed Care, Other (non HMO) | Admitting: Physical Therapy

## 2019-08-06 ENCOUNTER — Encounter: Payer: Managed Care, Other (non HMO) | Admitting: Physical Therapy

## 2019-10-06 IMAGING — CT CT CHEST W/ CM
2 of 3 series · 15 of 36 positions shown, 18 images · IV contrast (omnipaque)
Comparison: Chest radiograph 09/08/2018.

CLINICAL DATA: Lung nodule.

EXAM:
CT CHEST WITH CONTRAST
TECHNIQUE: Multidetector CT imaging of the chest was performed during
intravenous contrast administration.
CONTRAST:  80mL OMNIPAQUE IOHEXOL 300 MG/ML  SOLN

[Series 2: thorax · axial · 0.76mm/px · z∈[-377,-63]mm · 12 of 185 slices shown, 15 images]
[im 14/185  mediastinal]
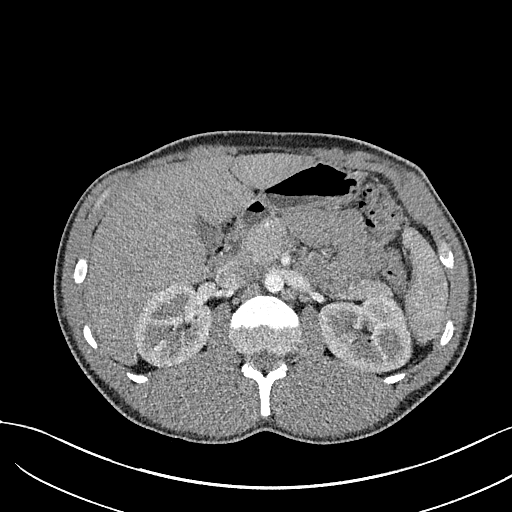
[im 14/185  lung]
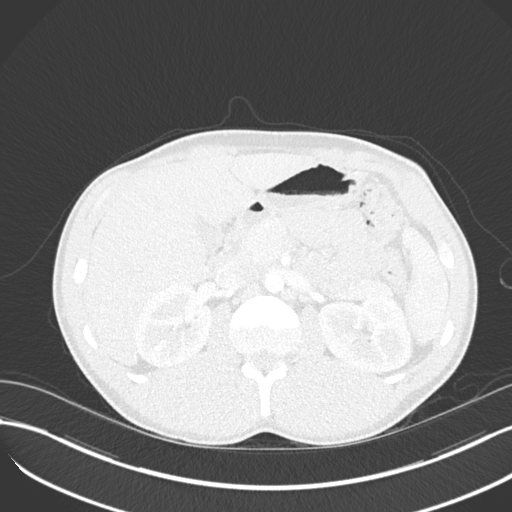
[im 28/185  lung]
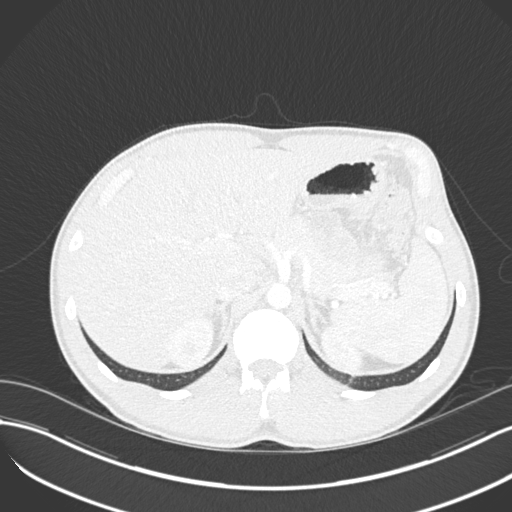
[im 41/185  lung]
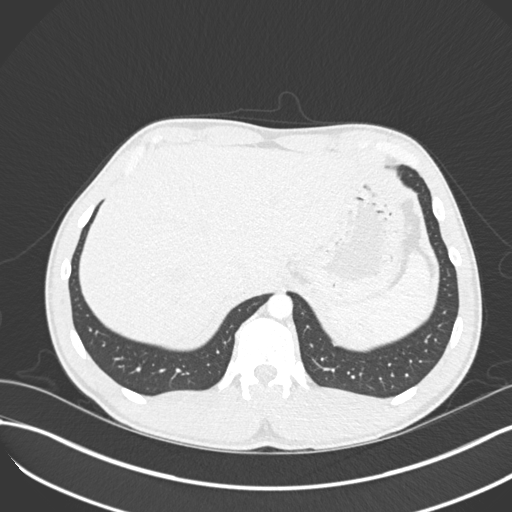
[im 55/185  lung]
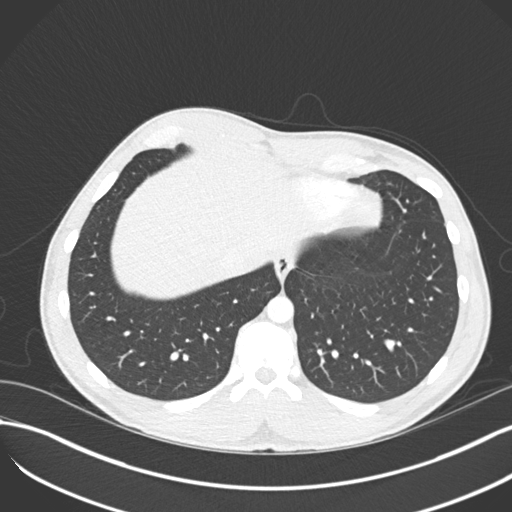
[im 69/185  mediastinal]
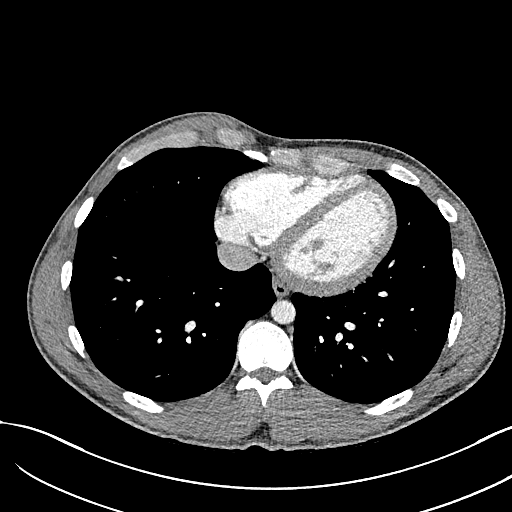
[im 69/185  lung]
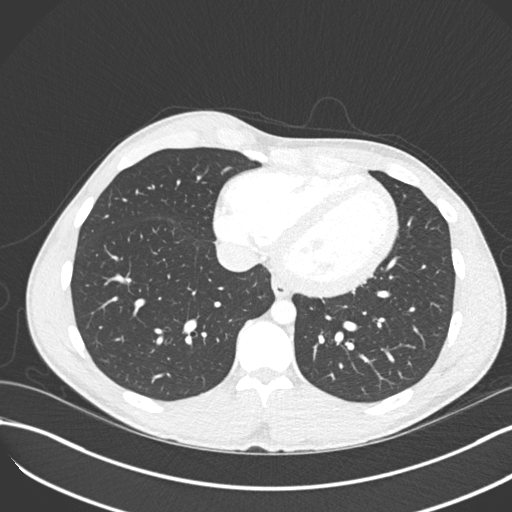
[im 82/185  lung]
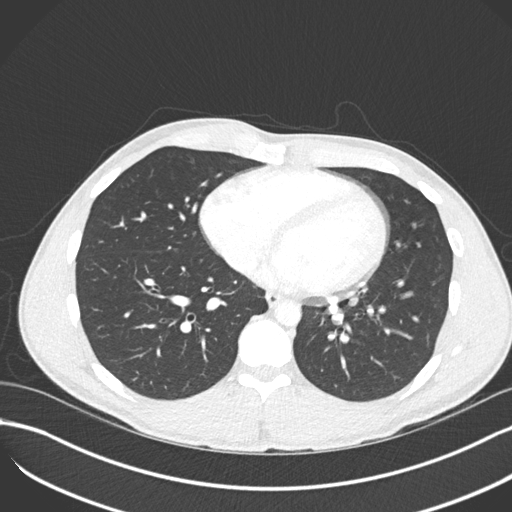
[im 103/185  lung]
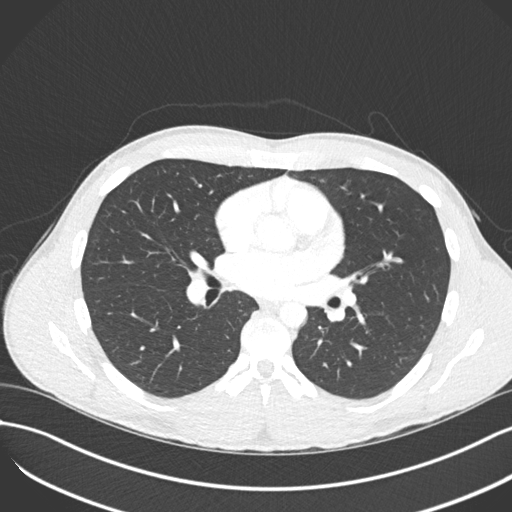
[im 116/185  lung]
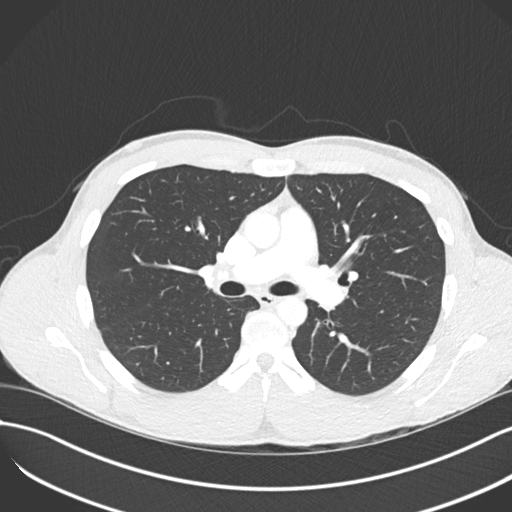
[im 130/185  mediastinal]
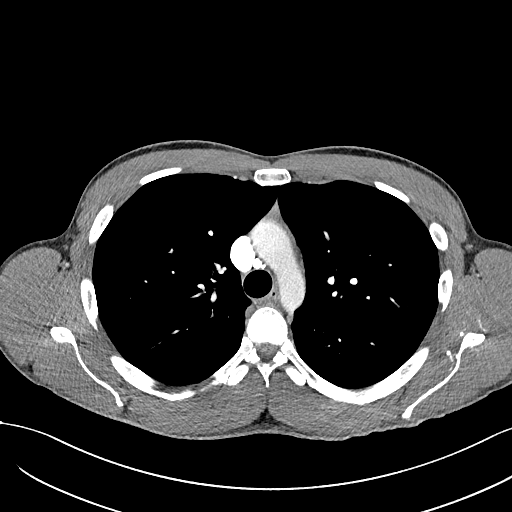
[im 130/185  lung]
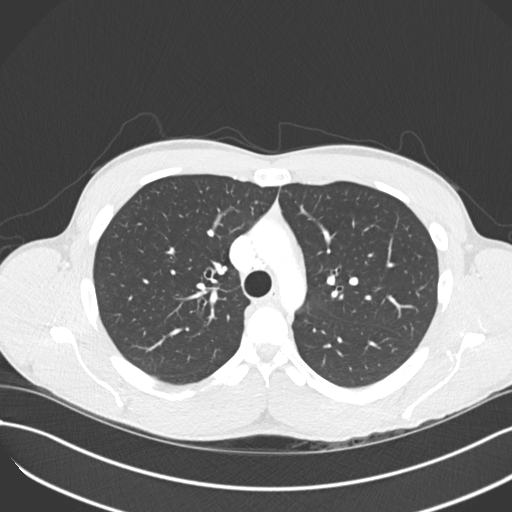
[im 144/185  lung]
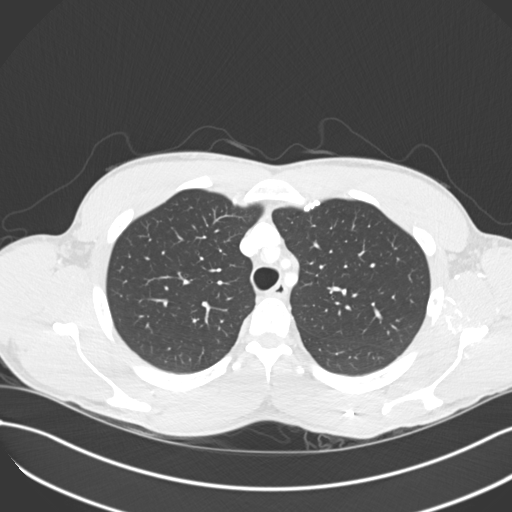
[im 157/185  lung]
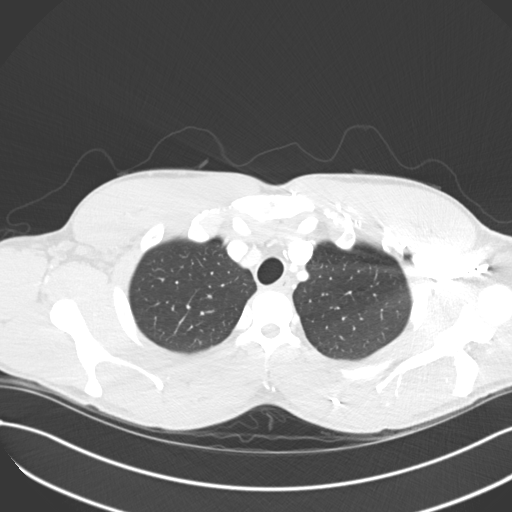
[im 171/185  lung]
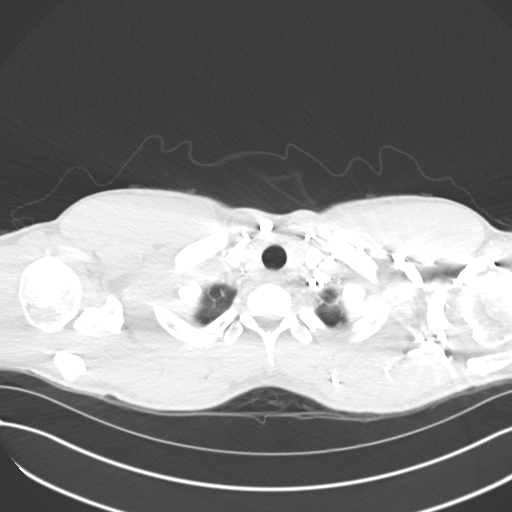

[Series 5: coronal · coronal · 0.72mm/px · 3 of 122 slices shown]
[im 25/122  lung]
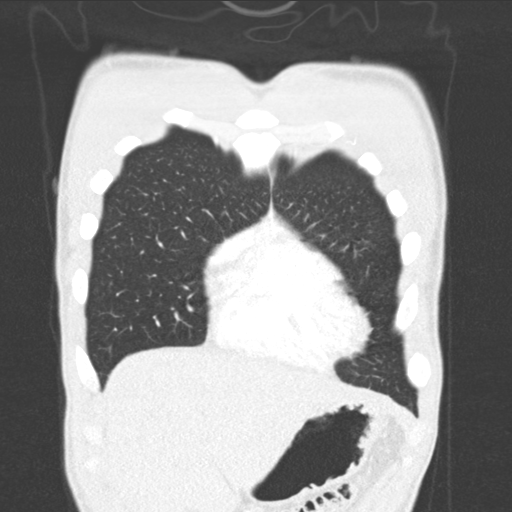
[im 49/122  lung]
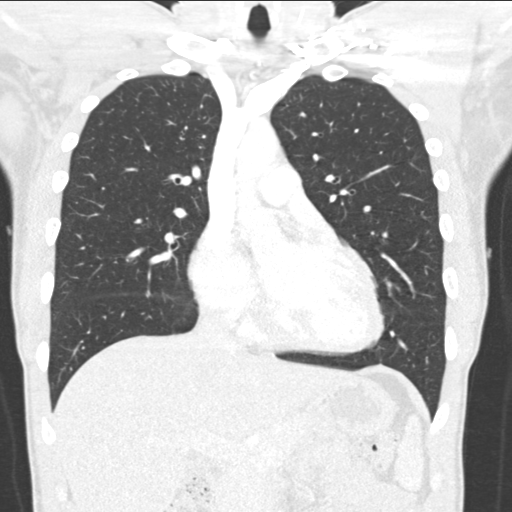
[im 73/122  lung]
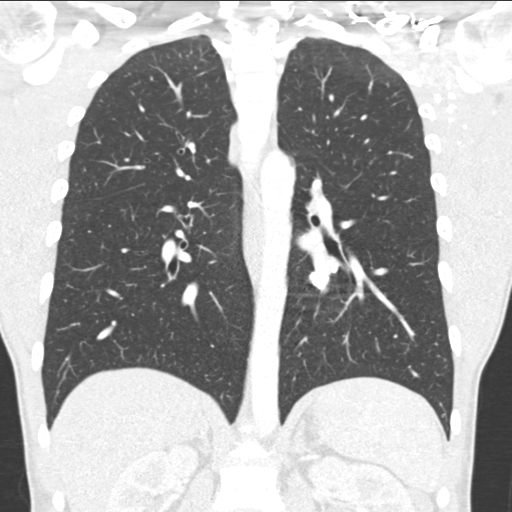

[15 of 36 positions shown; findings below may reference images not displayed]

FINDINGS: Cardiovascular: Vascular structures are unremarkable. Heart size
normal. No pericardial effusion.

Mediastinum/Nodes: Calcified mediastinal and right hilar lymph
nodes. No pathologically enlarged mediastinal, hilar or axillary
lymph nodes. Esophagus is unremarkable.

Lungs/Pleura: Scattered calcified granulomas. Lungs are otherwise
clear. No pleural fluid. Airway is unremarkable.

Upper Abdomen: Visualized portions of the liver, gallbladder,
adrenal glands, kidneys, spleen, pancreas, stomach and bowel are
grossly unremarkable.

Musculoskeletal: No worrisome lytic or sclerotic lesions.
IMPRESSION: Normal exam.  Calcified granulomas.  No worrisome pulmonary nodules.

## 2020-01-05 DIAGNOSIS — F419 Anxiety disorder, unspecified: Secondary | ICD-10-CM | POA: Diagnosis not present

## 2020-01-05 DIAGNOSIS — F902 Attention-deficit hyperactivity disorder, combined type: Secondary | ICD-10-CM | POA: Diagnosis not present

## 2020-01-05 DIAGNOSIS — Z79899 Other long term (current) drug therapy: Secondary | ICD-10-CM | POA: Diagnosis not present

## 2020-04-04 ENCOUNTER — Encounter: Payer: Self-pay | Admitting: Physician Assistant

## 2020-04-05 DIAGNOSIS — F419 Anxiety disorder, unspecified: Secondary | ICD-10-CM | POA: Diagnosis not present

## 2020-04-05 DIAGNOSIS — Z79899 Other long term (current) drug therapy: Secondary | ICD-10-CM | POA: Diagnosis not present

## 2020-04-05 DIAGNOSIS — F902 Attention-deficit hyperactivity disorder, combined type: Secondary | ICD-10-CM | POA: Diagnosis not present

## 2020-04-06 ENCOUNTER — Ambulatory Visit: Payer: BC Managed Care – PPO | Admitting: Physician Assistant

## 2020-04-06 ENCOUNTER — Encounter: Payer: Self-pay | Admitting: Physician Assistant

## 2020-04-06 ENCOUNTER — Other Ambulatory Visit: Payer: Self-pay

## 2020-04-06 ENCOUNTER — Other Ambulatory Visit (HOSPITAL_COMMUNITY)
Admission: RE | Admit: 2020-04-06 | Discharge: 2020-04-06 | Disposition: A | Discharge status: Home / Self Care | Payer: BC Managed Care – PPO | Source: Ambulatory Visit | Attending: Physician Assistant | Admitting: Physician Assistant

## 2020-04-06 VITALS — BP 112/80 | HR 74 | Temp 97.7°F | Ht 72.0 in | Wt 176.4 lb

## 2020-04-06 DIAGNOSIS — N50812 Left testicular pain: Secondary | ICD-10-CM

## 2020-04-06 DIAGNOSIS — Z113 Encounter for screening for infections with a predominantly sexual mode of transmission: Secondary | ICD-10-CM | POA: Diagnosis not present

## 2020-04-06 LAB — POC URINALSYSI DIPSTICK (AUTOMATED)
Bilirubin, UA: NEGATIVE
Blood, UA: NEGATIVE
Glucose, UA: NEGATIVE
Ketones, UA: NEGATIVE
Leukocytes, UA: NEGATIVE
Nitrite, UA: NEGATIVE
Protein, UA: NEGATIVE
Spec Grav, UA: 1.02 (ref 1.010–1.025)
Urobilinogen, UA: 0.2 E.U./dL
pH, UA: 6 (ref 5.0–8.0)

## 2020-04-06 MED ORDER — LEVOFLOXACIN 500 MG PO TABS: 500.0000 mg | ORAL_TABLET | Freq: Every day | ORAL | 0 refills | Status: DC

## 2020-04-06 NOTE — Progress Notes (Signed)
Seth Shields is a 30 y.o. male here for a new problem.  I acted as a Neurosurgeon for [employer], PA-C Corky Mull, LPN   History of Present Illness:   Chief Complaint  Patient presents with  . Abdominal Pain  . Testicle Pain    HPI   Testicle pain Pt c/o left testicle pain x 1 month. He says pain starts in left testicle and radiates into left lower abdomen. Was sharp at first but now is a dull pain. He has also noticed some clear/white penile discharge that started in September/November.  Had vasectomy in August at [facility]. Denies complications from this procedure.  He also feels like he is having some difficulty urinating where he feels like he is not emptying his bladder all the way. Denies hematuria. Had a fever 2-3 weeks ago and had flu-like symptoms. No recent trauma, does have hx of doing mixed martial arts.  Having some pain with sex this last week due to testicular discomfort. No changes to bowel movements or rectal bleeding.  Was diagnosed with epididymitis in 2020, was treated with abx and did not have imaging. Denies family hx of prostate or testicular cancer.   History reviewed. No pertinent past medical history.   Social History   Tobacco Use  . Smoking status: Former Smoker    Packs/day: 0.50    Years: 0.75    Pack years: 0.37    Quit date: 03/31/2018    Years since quitting: 2.0  . Smokeless tobacco: Never Used  Vaping Use  . Vaping Use: Never used  Substance Use Topics  . Alcohol use: Yes    Comment: 6/wk Beer and wine  . Drug use: Never    History reviewed. No pertinent surgical history.  Family History  Problem Relation Age of Onset  . Liver cancer Paternal Grandmother   . Liver cancer Paternal Grandfather   . Depression Sister        Manic Depression  . Heart disease Brother     No Known Allergies  Current Medications:   Current Outpatient Medications:  .  Amphetamine ER (ADZENYS XR-ODT) 18.8 MG TBED, Take by mouth daily.,  Disp: , Rfl:  .  Biotin 1 MG CAPS, Take by mouth., Disp: , Rfl:  .  Diclofenac Sodium (PENNSAID) 2 % SOLN, Place 2 g onto the skin 2 (two) times daily., Disp: 112 g, Rfl: 3 .  levofloxacin (LEVAQUIN) 500 MG tablet, Take 1 tablet (500 mg total) by mouth daily., Disp: 10 tablet, Rfl: 0 .  Multiple Vitamin (MULTIVITAMIN) tablet, Take 1 tablet by mouth daily., Disp: , Rfl:  .  Ferrous Gluconate (IRON 27 PO), Take by mouth. (Patient not taking: Reported on 04/06/2020), Disp: , Rfl:    Review of Systems:   ROS Negative unless otherwise specified per HPI.  Vitals:   Vitals:   04/06/20 0738  BP: 112/80  Pulse: 74  Temp: 97.7 F (36.5 C)  TempSrc: Temporal  SpO2: 99%  Weight: 176 lb 6.1 oz (80 kg)  Height: 6' (1.829 m)     Body mass index is 23.92 kg/m.  Physical Exam:   Physical Exam Vitals and nursing note reviewed. Exam conducted with a chaperone present Milinda Hirschfeld, CMA).  Constitutional:      Appearance: He is well-developed and well-nourished.  HENT:     Head: Normocephalic.  Eyes:     Extraocular Movements: EOM normal.     Conjunctiva/sclera: Conjunctivae normal.     Pupils: Pupils are equal, round,  and reactive to light.  Pulmonary:     Effort: Pulmonary effort is normal.  Genitourinary:    Penis: Normal.      Comments: Generalized tenderness and fullness to L testicle Musculoskeletal:        General: Normal range of motion.     Cervical back: Normal range of motion.  Skin:    General: Skin is warm and dry.  Neurological:     Mental Status: He is alert and oriented to person, place, and time.  Psychiatric:        Mood and Affect: Mood and affect normal.        Behavior: Behavior normal.        Thought Content: Thought content normal.        Judgment: Judgment normal.     Results for orders placed or performed in visit on 04/06/20  POCT Urinalysis Dipstick (Automated)  Result Value Ref Range   Color, UA yellow    Clarity, UA hazy    Glucose, UA Negative  Negative   Bilirubin, UA negative    Ketones, UA negative    Spec Grav, UA 1.020 1.010 - 1.025   Blood, UA negative    pH, UA 6.0 5.0 - 8.0   Protein, UA Negative Negative   Urobilinogen, UA 0.2 0.2 or 1.0 E.U./dL   Nitrite, UA negative    Leukocytes, UA Negative Negative    Assessment and Plan:   Kannon was seen today for abdominal pain and testicle pain.  Diagnoses and all orders for this visit:  Pain in left testicle UA negative. No evidence of acute testicular torsion. Urine cytology and culture pending. Given chronicity of symptoms, will order u/s for further evaluation and management. Start levaquin 500 mg daily for empiric coverage of epididymitis. Low threshold to follow-up with urology based on clinical symptoms and results of u/s. -     POCT Urinalysis Dipstick (Automated) -     Urine Culture -     US Scrotum; Future -     Cancel: [place] Art/Ven Flow Abd Pelv Doppler; Future -     Urine cytology ancillary only([place]) -     US SCROTUM DOPPLER; Future  Other orders -     levofloxacin (LEVAQUIN) 500 MG tablet; Take 1 tablet (500 mg total) by mouth daily.      CMA or LPN served as scribe during this visit. History, Physical, and Plan performed by medical provider. The above documentation has been reviewed and is accurate and complete.   Jarold Motto, PA-C

## 2020-04-06 NOTE — Patient Instructions (Signed)
It was great to see you!  Let's start oral antibiotic Levaquin, 500 mg daily x 10 days.  Ultrasound will be scheduled soon, you will be contacted about this by phone.  I will be in touch with all urine results.  Let me know if any symptoms change in the meantime.  Take care,  Jarold Motto PA-C

## 2020-04-07 LAB — URINE CYTOLOGY ANCILLARY ONLY
Chlamydia: NEGATIVE
Comment: NEGATIVE
Comment: NEGATIVE
Comment: NORMAL
Neisseria Gonorrhea: NEGATIVE
Trichomonas: NEGATIVE

## 2020-04-07 LAB — URINE CULTURE
MICRO NUMBER:: 11597849
Result:: NO GROWTH
SPECIMEN QUALITY:: ADEQUATE

## 2020-04-08 ENCOUNTER — Ambulatory Visit (HOSPITAL_COMMUNITY)
Admission: RE | Admit: 2020-04-08 | Discharge: 2020-04-08 | Disposition: A | Discharge status: Home / Self Care | Payer: BC Managed Care – PPO | Source: Ambulatory Visit | Attending: Physician Assistant | Admitting: Physician Assistant

## 2020-04-08 ENCOUNTER — Other Ambulatory Visit: Payer: Self-pay

## 2020-04-08 DIAGNOSIS — N50812 Left testicular pain: Secondary | ICD-10-CM | POA: Insufficient documentation

## 2020-05-17 ENCOUNTER — Encounter: Payer: Self-pay | Admitting: Physician Assistant

## 2020-05-17 DIAGNOSIS — N50812 Left testicular pain: Secondary | ICD-10-CM

## 2020-05-17 NOTE — Telephone Encounter (Signed)
Do you want me to send referral to Urology?

## 2020-05-30 DIAGNOSIS — N411 Chronic prostatitis: Secondary | ICD-10-CM | POA: Diagnosis not present

## 2020-05-30 DIAGNOSIS — N453 Epididymo-orchitis: Secondary | ICD-10-CM | POA: Diagnosis not present

## 2020-05-30 DIAGNOSIS — N50812 Left testicular pain: Secondary | ICD-10-CM | POA: Diagnosis not present

## 2020-07-05 DIAGNOSIS — F902 Attention-deficit hyperactivity disorder, combined type: Secondary | ICD-10-CM | POA: Diagnosis not present

## 2020-07-05 DIAGNOSIS — Z79899 Other long term (current) drug therapy: Secondary | ICD-10-CM | POA: Diagnosis not present

## 2020-07-06 DIAGNOSIS — F902 Attention-deficit hyperactivity disorder, combined type: Secondary | ICD-10-CM | POA: Diagnosis not present

## 2020-10-04 DIAGNOSIS — F902 Attention-deficit hyperactivity disorder, combined type: Secondary | ICD-10-CM | POA: Diagnosis not present

## 2020-10-04 DIAGNOSIS — Z79899 Other long term (current) drug therapy: Secondary | ICD-10-CM | POA: Diagnosis not present

## 2020-11-01 DIAGNOSIS — N411 Chronic prostatitis: Secondary | ICD-10-CM | POA: Diagnosis not present

## 2020-11-01 DIAGNOSIS — K4030 Unilateral inguinal hernia, with obstruction, without gangrene, not specified as recurrent: Secondary | ICD-10-CM | POA: Diagnosis not present

## 2020-11-10 DIAGNOSIS — K4030 Unilateral inguinal hernia, with obstruction, without gangrene, not specified as recurrent: Secondary | ICD-10-CM | POA: Diagnosis not present

## 2020-12-01 DIAGNOSIS — F4323 Adjustment disorder with mixed anxiety and depressed mood: Secondary | ICD-10-CM | POA: Diagnosis not present

## 2021-01-05 DIAGNOSIS — K4030 Unilateral inguinal hernia, with obstruction, without gangrene, not specified as recurrent: Secondary | ICD-10-CM | POA: Diagnosis not present

## 2021-02-10 DIAGNOSIS — K4090 Unilateral inguinal hernia, without obstruction or gangrene, not specified as recurrent: Secondary | ICD-10-CM | POA: Diagnosis not present

## 2021-02-10 DIAGNOSIS — N411 Chronic prostatitis: Secondary | ICD-10-CM | POA: Diagnosis not present

## 2021-04-04 DIAGNOSIS — Z79899 Other long term (current) drug therapy: Secondary | ICD-10-CM | POA: Diagnosis not present

## 2021-04-04 DIAGNOSIS — F902 Attention-deficit hyperactivity disorder, combined type: Secondary | ICD-10-CM | POA: Diagnosis not present

## 2021-04-06 DIAGNOSIS — Z79899 Other long term (current) drug therapy: Secondary | ICD-10-CM | POA: Diagnosis not present

## 2021-04-06 DIAGNOSIS — F902 Attention-deficit hyperactivity disorder, combined type: Secondary | ICD-10-CM | POA: Diagnosis not present

## 2021-07-04 DIAGNOSIS — F902 Attention-deficit hyperactivity disorder, combined type: Secondary | ICD-10-CM | POA: Diagnosis not present

## 2021-07-04 DIAGNOSIS — Z79899 Other long term (current) drug therapy: Secondary | ICD-10-CM | POA: Diagnosis not present

## 2021-09-18 DIAGNOSIS — K4090 Unilateral inguinal hernia, without obstruction or gangrene, not specified as recurrent: Secondary | ICD-10-CM | POA: Diagnosis not present

## 2021-10-03 DIAGNOSIS — F902 Attention-deficit hyperactivity disorder, combined type: Secondary | ICD-10-CM | POA: Diagnosis not present

## 2021-10-03 DIAGNOSIS — F419 Anxiety disorder, unspecified: Secondary | ICD-10-CM | POA: Diagnosis not present

## 2021-10-03 DIAGNOSIS — Z79899 Other long term (current) drug therapy: Secondary | ICD-10-CM | POA: Diagnosis not present

## 2021-10-30 ENCOUNTER — Encounter: Payer: Self-pay | Admitting: *Deleted

## 2022-01-01 ENCOUNTER — Encounter: Payer: BC Managed Care – PPO | Admitting: Physician Assistant

## 2022-01-08 ENCOUNTER — Ambulatory Visit (INDEPENDENT_AMBULATORY_CARE_PROVIDER_SITE_OTHER): Payer: BC Managed Care – PPO | Admitting: Physician Assistant

## 2022-01-08 ENCOUNTER — Encounter: Payer: Self-pay | Admitting: Physician Assistant

## 2022-01-08 VITALS — BP 120/68 | HR 67 | Temp 97.5°F | Ht 72.0 in | Wt 183.2 lb

## 2022-01-08 DIAGNOSIS — F909 Attention-deficit hyperactivity disorder, unspecified type: Secondary | ICD-10-CM | POA: Diagnosis not present

## 2022-01-08 DIAGNOSIS — Z0000 Encounter for general adult medical examination without abnormal findings: Secondary | ICD-10-CM | POA: Diagnosis not present

## 2022-01-08 DIAGNOSIS — Z136 Encounter for screening for cardiovascular disorders: Secondary | ICD-10-CM | POA: Diagnosis not present

## 2022-01-08 DIAGNOSIS — Z1159 Encounter for screening for other viral diseases: Secondary | ICD-10-CM | POA: Diagnosis not present

## 2022-01-08 DIAGNOSIS — G8929 Other chronic pain: Secondary | ICD-10-CM

## 2022-01-08 DIAGNOSIS — Z114 Encounter for screening for human immunodeficiency virus [HIV]: Secondary | ICD-10-CM | POA: Diagnosis not present

## 2022-01-08 DIAGNOSIS — Z13220 Encounter for screening for lipoid disorders: Secondary | ICD-10-CM | POA: Diagnosis not present

## 2022-01-08 DIAGNOSIS — M546 Pain in thoracic spine: Secondary | ICD-10-CM

## 2022-01-08 LAB — COMPREHENSIVE METABOLIC PANEL
ALT: 24 U/L (ref 0–53)
AST: 25 U/L (ref 0–37)
Albumin: 4.7 g/dL (ref 3.5–5.2)
Alkaline Phosphatase: 63 U/L (ref 39–117)
BUN: 15 mg/dL (ref 6–23)
CO2: 29 mEq/L (ref 19–32)
Calcium: 9.8 mg/dL (ref 8.4–10.5)
Chloride: 101 mEq/L (ref 96–112)
Creatinine, Ser: 1.43 mg/dL (ref 0.40–1.50)
GFR: 65.95 mL/min (ref >60.00)
Glucose, Bld: 72 mg/dL (ref 70–99)
Potassium: 4.6 mEq/L (ref 3.5–5.1)
Sodium: 137 mEq/L (ref 135–145)
Total Bilirubin: 0.5 mg/dL (ref 0.2–1.2)
Total Protein: 6.8 g/dL (ref 6.0–8.3)

## 2022-01-08 LAB — LIPID PANEL
Cholesterol: 122 mg/dL (ref 0–200)
HDL: 58 mg/dL (ref >39.00)
LDL Cholesterol: 53 mg/dL (ref 0–99)
NonHDL: 63.85
Total CHOL/HDL Ratio: 2
Triglycerides: 54 mg/dL (ref 0.0–149.0)
VLDL: 10.8 mg/dL (ref 0.0–40.0)

## 2022-01-08 LAB — CBC WITH DIFFERENTIAL/PLATELET
Basophils Absolute: 0.1 10*3/uL (ref 0.0–0.1)
Basophils Relative: 1 % (ref 0.0–3.0)
Eosinophils Absolute: 0 10*3/uL (ref 0.0–0.7)
Eosinophils Relative: 0.5 % (ref 0.0–5.0)
HCT: 42.4 % (ref 39.0–52.0)
Hemoglobin: 14.2 g/dL (ref 13.0–17.0)
Lymphocytes Relative: 32 % (ref 12.0–46.0)
Lymphs Abs: 1.9 10*3/uL (ref 0.7–4.0)
MCHC: 33.6 g/dL (ref 30.0–36.0)
MCV: 88.2 fl (ref 78.0–100.0)
Monocytes Absolute: 0.5 10*3/uL (ref 0.1–1.0)
Monocytes Relative: 9.2 % (ref 3.0–12.0)
Neutro Abs: 3.3 10*3/uL (ref 1.4–7.7)
Neutrophils Relative %: 57.3 % (ref 43.0–77.0)
Platelets: 310 10*3/uL (ref 150.0–400.0)
RBC: 4.81 Mil/uL (ref 4.22–5.81)
RDW: 12 % (ref 11.5–15.5)
WBC: 5.8 10*3/uL (ref 4.0–10.5)

## 2022-01-08 NOTE — Progress Notes (Signed)
Subjective:    Seth Shields is a 31 y.o. male and is here for a comprehensive physical exam.  HPI  Health Maintenance Due  Topic Date Due   Hepatitis C Screening  Never done    Acute Concerns: None.  Chronic Issues: Chronic right-sided thoracic back pain Pt c/o of mid back pain on the right side with tightness that goes up into right shoulder and neck. He has seen chiropractor in the past a few times for adjustments, but symptoms does return.  He would like a referral for physical therapy -- has done this in the past and dry needling helped. He saw a PT in [place] for this but wants something closer to his work. Denies any new sx.  ADHD Well managed with medications. Continues to see Dr. Marisue Brooklyn with [facility] every few months  Denies any further complications   Health Maintenance: Immunizations --UTD Colonoscopy -- N/A PSA -- N/A Diet -- Well balanced diet  Sleep habits -- Poor Exercise -- Works out every morning   Weight --  Recent weight history Wt Readings from Last 10 Encounters:  01/08/22 183 lb 4 oz (83.1 kg)  04/06/20 176 lb 6.1 oz (80 kg)  06/11/19 189 lb (85.7 kg)  06/05/19 189 lb (85.7 kg)  01/13/19 185 lb (83.9 kg)  10/02/18 187 lb (84.8 kg)  09/08/18 188 lb (85.3 kg)   Body mass index is 24.85 kg/m.  Mood -- Good Alcohol use --  reports current alcohol use.  Tobacco use --  Tobacco Use: Medium Risk (01/08/2022)   Patient History    Smoking Tobacco Use: Former    Smokeless Tobacco Use: Never    Passive Exposure: Not on file    Eligible for Low Dose CT? no  UTD with eye doctor? No UTD with dentist? No     01/08/2022    1:26 PM  Depression screen PHQ 2/9  Decreased Interest 0  Down, Depressed, Hopeless 0  PHQ - 2 Score 0    Other providers/specialists: Patient Care Team: Jarold Motto, [place] as PCP - General (Physician Assistant)    PMHx, SurgHx, SocialHx, Medications, and Allergies were reviewed in  the Visit Navigator and updated as appropriate.   History reviewed. No pertinent past medical history.  History reviewed. No pertinent surgical history.   Family History  Problem Relation Age of Onset   Depression Sister        Manic Depression   Heart disease Brother    Liver cancer Paternal Grandmother        ?lung cancer   Liver cancer Paternal Grandfather     Social History   Tobacco Use   Smoking status: Former    Packs/day: 0.50    Years: 0.75    Total pack years: 0.38    Types: Cigarettes    Quit date: 03/31/2018    Years since quitting: 3.7   Smokeless tobacco: Never  Vaping Use   Vaping Use: Never used  Substance Use Topics   Alcohol use: Yes    Comment: 6/wk Beer and wine   Drug use: Never    Review of Systems:   Review of Systems  Constitutional:  Negative for chills, fever, malaise/fatigue and weight loss.  HENT:  Negative for hearing loss, sinus pain and sore throat.   Respiratory:  Negative for cough and hemoptysis.   Cardiovascular:  Negative for chest pain, palpitations, leg swelling and PND.  Gastrointestinal:  Negative for abdominal pain, constipation, diarrhea, heartburn, nausea  and vomiting.  Genitourinary:  Negative for dysuria, frequency and urgency.  Musculoskeletal:  Negative for back pain, myalgias and neck pain.  Skin:  Negative for itching and rash.  Neurological:  Negative for dizziness, tingling, seizures and headaches.  Endo/Heme/Allergies:  Negative for polydipsia.  Psychiatric/Behavioral:  Negative for depression. The patient is not nervous/anxious.     Objective:    Vitals:   01/08/22 1327  BP: 120/68  Pulse: 67  Temp: (!) 97.5 F (36.4 C)  SpO2: 99%    Body mass index is 24.85 kg/m.  General  Alert, cooperative, no distress, appears stated age  Head:  Normocephalic, without obvious abnormality, atraumatic  Eyes:  PERRL, conjunctiva/corneas clear, EOM's intact, fundi benign, both eyes       Ears:  Normal TM's and  external ear canals, both ears  Nose: Nares normal, septum midline, mucosa normal, no drainage or sinus tenderness  Throat: Lips, mucosa, and tongue normal; teeth and gums normal  Neck: Supple, symmetrical, trachea midline, no adenopathy;     thyroid:  No enlargement/tenderness/nodules; no carotid bruit or JVD  Back:   Symmetric, no curvature, ROM normal, no CVA tenderness  Lungs:   Clear to auscultation bilaterally, respirations unlabored  Chest wall:  No tenderness or deformity  Heart:  Regular rate and rhythm, S1 and S2 normal, no murmur, rub or gallop  Abdomen:   Soft, non-tender, bowel sounds active all four quadrants, no masses, no organomegaly  Extremities: Extremities normal, atraumatic, no cyanosis or edema  Prostate : Deferred   Skin: Skin color, texture, turgor normal, no rashes or lesions  Lymph nodes: Cervical, supraclavicular, and axillary nodes normal  Neurologic: CNII-XII grossly intact. Normal strength, sensation and reflexes throughout   AssessmentPlan:   Routine physical examination Today patient counseled on age appropriate routine health concerns for screening and prevention, each reviewed and up to date or declined. Immunizations reviewed and up to date or declined. Labs ordered and reviewed. Risk factors for depression reviewed and negative. Hearing function and visual acuity are intact. ADLs screened and addressed as needed. Functional ability and level of safety reviewed and appropriate. Education, counseling and referrals performed based on assessed risks today. Patient provided with a copy of personalized plan for preventive services.  Attention deficit hyperactivity disorder (ADHD), unspecified ADHD type Well controlled Mgmt per specialists  Chronic right-sided thoracic back pain Referral to PT per patient request  Encounter for screening for other viral diseases Update Hep C  Screening for HIV (human immunodeficiency virus) Update HIV  Encounter for  lipid screening for cardiovascular disease Update lipid panel  I,Moesha Myer,acting as a scribe for Jarold Motto, PA.,have documented all relevant documentation on the behalf of Jarold Motto, PA,as directed by  Jarold Motto, PA while in the presence of Jarold Motto, [place].  I, Jarold Motto, [place], have reviewed all documentation for this visit. The documentation on 01/08/22 for the exam, diagnosis, procedures, and orders are all accurate and complete.  Jarold Motto PA-C

## 2022-01-08 NOTE — Patient Instructions (Addendum)
It was great to see you!  Please try to see a dentist and eye doctor. Keep up the good work with exercise and healthy eating.  I will place referral for physical therapy at [facility] on [facility].  Please go to the lab for blood work.   Our office will call you with your results unless you have chosen to receive results via MyChart.  If your blood work is normal we will follow-up each year for physicals and as scheduled for chronic medical problems.  If anything is abnormal we will treat accordingly and get you in for a follow-up.  Take care,  Lelon Mast

## 2022-01-09 LAB — HEPATITIS C ANTIBODY: Hepatitis C Ab: NONREACTIVE

## 2022-01-09 LAB — HIV ANTIBODY (ROUTINE TESTING W REFLEX): HIV 1&2 Ab, 4th Generation: NONREACTIVE

## 2022-02-21 ENCOUNTER — Other Ambulatory Visit: Payer: Self-pay

## 2022-02-21 ENCOUNTER — Encounter (HOSPITAL_BASED_OUTPATIENT_CLINIC_OR_DEPARTMENT_OTHER): Payer: Self-pay | Admitting: Physical Therapy

## 2022-02-21 ENCOUNTER — Ambulatory Visit (HOSPITAL_BASED_OUTPATIENT_CLINIC_OR_DEPARTMENT_OTHER): Payer: BC Managed Care – PPO | Attending: Physician Assistant | Admitting: Physical Therapy

## 2022-02-21 DIAGNOSIS — M546 Pain in thoracic spine: Secondary | ICD-10-CM | POA: Insufficient documentation

## 2022-02-21 DIAGNOSIS — M6281 Muscle weakness (generalized): Secondary | ICD-10-CM | POA: Insufficient documentation

## 2022-02-21 DIAGNOSIS — G8929 Other chronic pain: Secondary | ICD-10-CM | POA: Insufficient documentation

## 2022-02-21 NOTE — Therapy (Signed)
OUTPATIENT PHYSICAL THERAPY THORACOLUMBAR EVALUATION   Patient Name: Seth Shields MRN: [id number] DOB:1990/09/15, 32 y.o., male Today's Date: 02/21/2022  END OF SESSION:  PT End of Session - 02/21/22 1227     Visit Number 1    Number of Visits 16    Date for PT Re-Evaluation 05/22/22    Authorization Type BCBS    PT Start Time [id number]   arrives late   PT Stop Time 0845    PT Time Calculation (min) 35 min    Activity Tolerance Patient tolerated treatment well    Behavior During Therapy [facility] for tasks assessed/performed             History reviewed. No pertinent past medical history. History reviewed. No pertinent surgical history. Patient Active Problem List   Diagnosis Date Noted   Solitary pulmonary nodule 10/02/2018   Dyspnea 10/02/2018   Scoliosis deformity of spine 09/21/2017    PCP: [name], PA   REFERRING PROVIDER: [name], PA   REFERRING DIAG:  Diagnosis  M54.6,G89.29 (ICD-10-CM) - Chronic right-sided thoracic back pain    Rationale for Evaluation and Treatment: Rehabilitation  THERAPY DIAG:  Pain in thoracic spine - Plan: PT plan of care cert/re-cert  Muscle weakness (generalized) - Plan: PT plan of care cert/re-cert  ONSET DATE: 8811  SUBJECTIVE:                                                                                                                                                                                           SUBJECTIVE STATEMENT: Pt states the pain is in the mid-back on the R side in the paraspinal muscle and R side.  Pt has had DN in the past before for it that has helped. The pain will go from his shoulder down his R hip. Pt does a lot of stretching from PT before. 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 Does also do grappling. Starting when he was 77.  Sitting all day and not working out will tight. Pushing/pressing motions also hurt the day after. Pt has decreased sensation down into  the lateral 3 fingers but sensation will come back after he pops his back. Denies cancer red flags.  PERTINENT HISTORY:  L and R clavicle fractures; partially torn RC (unknown laterality)  PAIN:  Are you having pain? Yes: NPRS scale: 2-3/10; 5-6/10 Pain location: R mid back and into shoulder Pain description: dull, numbing, tingling pain Aggravating factors: R cross/jab, pressing/pushing workouts  Relieving factors: meds, stretching, rest  PRECAUTIONS: None  WEIGHT BEARING RESTRICTIONS: No  FALLS:  Has patient fallen in last 6  months? No  LIVING ENVIRONMENT: Lives with: lives with their spouse Lives in: House/apartment Stairs: Yes Has following equipment at home: None  OCCUPATION: attorney, standing desk   PLOF: Independent  PATIENT GOALS: reduce pain reducing R side tightness.   OBJECTIVE:   DIAGNOSTIC FINDINGS:  N/A  PATIENT SURVEYS:  FOTO will give at next, limited by time at eval   COGNITION: Overall cognitive status: Within functional limits for tasks assessed     SENSATION: Light touch: Impaired    POSTURE: No Significant postural limitations  PALPATION: TTP of periscapular region, R T/S and L/S paraspinals, R UT  LUMBAR ROM: WNL at T/S and L/S  UE ROM: WNL without pain; strong scapular winging bilat R>L with OH and WB positions  MMT Right   Left   Shoulder flexion 4+/5 4+/5  Shoulder protraction 4/5 4+/5  Shoulder abduction 4+/5 4+/5  Shoulder adduction    Shoulder internal rotation 4+/5 4+/5  Shoulder external rotation 4/5 4+/5  (Blank rows = not tested)   TODAY'S TREATMENT:                                                                                                                              DATE:   Program Notes Bird dog row 2x12 15lbs   Exercises - Full Plank with Scapular Protraction Retraction AROM  - 1 x daily - 4-5 x weekly - 2 sets - 10 reps - 5 hold - Plank Walk Outs  - 1 x daily - 4-5 x weekly - 2 sets - 10 reps     PATIENT EDUCATION:  Education details: MOI, diagnosis, prognosis, anatomy, exercise progression, DOMS expectations, muscle firing,  envelope of function, HEP, POC  Person educated: Patient Education method: Explanation, Demonstration, Tactile cues, Verbal cues, and Handouts Education comprehension: verbalized understanding, returned demonstration, verbal cues required, tactile cues required, and needs further education  HOME EXERCISE PROGRAM:  Access Code: [email or web address] URL: [email or web address] Date: 02/21/2022 Prepared by: Daleen Bo  ASSESSMENT:  CLINICAL IMPRESSION: Patient is a 32 y.o. male who was seen today for physical therapy evaluation and treatment for c/c of R  thoracic and neck pain. Pt s/s consistent with scapular weakness and lack of motor control of the R UE. Pt has repetitive motion tasks with MMA that require strong protraction and mid back stability likely contributing to pain. Pt has good ROM and mobility with strength and soft tissue hypertonicity likely contributing to pain. Session limited by time but will continue to test for CKC motor control and stability at future sessions. Plan to give FOTO at next. Pt would benefit from continued skilled therapy in order to reach goals and maximize functional thoracic strength and ROM for full return to PLOF.   OBJECTIVE IMPAIRMENTS: decreased knowledge of condition, decreased strength, increased muscle spasms, impaired UE functional use, improper body mechanics, postural dysfunction, and pain.   ACTIVITY LIMITATIONS: carrying, lifting, and pushing  PARTICIPATION LIMITATIONS: community activity and exercise  PERSONAL  FACTORS: Time since onset of injury/illness/exacerbation are also affecting patient's functional outcome.   REHAB POTENTIAL: Good  CLINICAL DECISION MAKING: Stable/uncomplicated  EVALUATION COMPLEXITY: Low   GOALS:   SHORT TERM GOALS: Target date: 04/04/2022   Pt will become independent  with HEP in order to demonstrate synthesis of PT education.  Goal status: INITIAL  2.  Pt will be able to demonstrate ability to perform CKC and OH ROM without scapular winging  in order to demonstrate functional improvement in UE/LE function for self-care and house hold duties.   Goal status: INITIAL     LONG TERM GOALS: Target date: 05/16/2022   Pt  will become independent with final HEP in order to demonstrate synthesis of PT education.   Goal status: INITIAL  2.  Pt will be able to demonstrate ability to return to full sport and training without midback pain in order to demonstrate functional improvement in midback function for pain free exercise/ recreation.   Goal status: INITIAL  3.  Pt will be able to demonstrate ability to catch/land/push without pain in order to demonstrate functional improvement and tolerance to low level plyometric loading.   Goal status: INITIAL    PLAN:  PT FREQUENCY: 1-2x/week  PT DURATION: 12 weeks (likely to D/C in 6-8 wks)  PLANNED INTERVENTIONS: Therapeutic exercises, Therapeutic activity, Neuromuscular re-education, Balance training, Gait training, Patient/Family education, Self Care, Joint mobilization, Joint manipulation, Orthotic/Fit training, Aquatic Therapy, Dry Needling, Electrical stimulation, Spinal manipulation, Spinal mobilization, Cryotherapy, Moist heat, scar mobilization, Splintting, Taping, Vasopneumatic device, Traction, Ultrasound, Biofeedback, Ionotophoresis 4mg /ml Dexamethasone, Manual therapy, and Re-evaluation.  PLAN FOR NEXT SESSION: scapular stability, protraction strength, bear walks, push up plus; STM and TPDN PRN for mid thoracic region   Daleen Bo, PT 02/21/2022, 12:45 PM

## 2022-02-28 ENCOUNTER — Ambulatory Visit (HOSPITAL_BASED_OUTPATIENT_CLINIC_OR_DEPARTMENT_OTHER): Payer: BC Managed Care – PPO | Admitting: Physical Therapy

## 2022-02-28 ENCOUNTER — Encounter (HOSPITAL_BASED_OUTPATIENT_CLINIC_OR_DEPARTMENT_OTHER): Payer: Self-pay | Admitting: Physical Therapy

## 2022-02-28 DIAGNOSIS — G8929 Other chronic pain: Secondary | ICD-10-CM | POA: Diagnosis not present

## 2022-02-28 DIAGNOSIS — M6281 Muscle weakness (generalized): Secondary | ICD-10-CM | POA: Diagnosis not present

## 2022-02-28 DIAGNOSIS — M546 Pain in thoracic spine: Secondary | ICD-10-CM | POA: Diagnosis not present

## 2022-02-28 NOTE — Therapy (Signed)
OUTPATIENT PHYSICAL THERAPY TREATMENT   Patient Name: Seth Shields MRN: [id number] DOB:1990/07/01, 32 y.o., male Today's Date: 02/28/2022  END OF SESSION:  PT End of Session - 02/28/22 0803     Visit Number 2    Number of Visits 16    Date for PT Re-Evaluation 05/22/22    Authorization Type BCBS    PT Start Time 0803    PT Stop Time 0845    PT Time Calculation (min) 42 min    Activity Tolerance Patient tolerated treatment well    Behavior During Therapy [facility] for tasks assessed/performed              History reviewed. No pertinent past medical history. History reviewed. No pertinent surgical history. Patient Active Problem List   Diagnosis Date Noted   Solitary pulmonary nodule 10/02/2018   Dyspnea 10/02/2018   Scoliosis deformity of spine 09/21/2017    PCP: [name], PA   REFERRING PROVIDER: [name], PA   REFERRING DIAG:  Diagnosis  M54.6,G89.29 (ICD-10-CM) - Chronic right-sided thoracic back pain    Rationale for Evaluation and Treatment: Rehabilitation  THERAPY DIAG:  Pain in thoracic spine  Muscle weakness (generalized)  ONSET DATE: 2020  SUBJECTIVE:                                                                                                                                                                                           SUBJECTIVE STATEMENT: No problems with the exercises. A little bit of tightness and pain this morning.   PERTINENT HISTORY:  L and R clavicle fractures; partially torn RC (unknown laterality)  From eval: Pt states the pain is in the mid-back on the R side in the paraspinal muscle and R side.  Pt has had DN in the past before for it that has helped. The pain will go from his shoulder down his R hip. Pt does a lot of stretching from PT before. 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 Does also do grappling. Starting when he was 30.  Sitting all day and not working out will tight.  Pushing/pressing motions also hurt the day after. Pt has decreased sensation down into the lateral 3 fingers but sensation will come back after he pops his back. Denies cancer red flags.  PAIN:  Are you having pain? Yes: NPRS scale: 2-3/10; 5-6/10 Pain location: R mid back and into shoulder Pain description: dull, numbing, tingling pain Aggravating factors: R cross/jab, pressing/pushing workouts  Relieving factors: meds, stretching, rest  PRECAUTIONS: None  WEIGHT BEARING RESTRICTIONS: No  FALLS:  Has patient fallen in last  6 months? No   OCCUPATION: attorney, standing desk    PATIENT GOALS: reduce pain reducing R side tightness.   OBJECTIVE:   PATIENT SURVEYS:  FOTO 23; predicted 70  PALPATION: TTP of periscapular region, R T/S and L/S paraspinals, R UT  UE ROM: WNL without pain; strong scapular winging bilat R>L with OH and WB positions  MMT Right   Left   Shoulder flexion 4+/5 4+/5  Shoulder protraction 4/5 4+/5  Shoulder abduction 4+/5 4+/5  Shoulder adduction    Shoulder internal rotation 4+/5 4+/5  Shoulder external rotation 4/5 4+/5  (Blank rows = not tested)   TODAY'S TREATMENT:                                                                                                                              DATE:  02/28/22 THEREX Elliptical warm up x 6 min Bird dog with 15# KB row 2x12 Full plank with scap retraction/protraction 2x10x5 sec (could feel pulling in posterior shoulder on R with protraction, improved after lat stretching) Lat stretch against wall x30 sec Doorway pec stretch 65 deg and 90 deg x 30 sec each Plank walk outs with red TB for scapular clock at end range 2x10 Prone "W" 10x5 sec holds (PT assist to obtain correct scapular position) Prone "Y" 10x5 sec holds (PT assist to obtain correct scapular position)   MANUAL THERAPY Skilled assessment and palpation for TPDN Trigger Point Dry-Needling  Treatment instructions: Expect mild to moderate  muscle soreness. S/S of pneumothorax if dry needled over a lung field, and to seek immediate medical attention should they occur. Patient verbalized understanding of these instructions and education.  Patient Consent Given: Yes Education handout provided: Previously provided Muscles treated: R thoracic paraspinal, R lat Electrical stimulation performed: Yes Parameters:  2 milliamps, frequency 2, intensity to pt tolerance to obtain muscle twitch Treatment response/outcome: Twitch response, decreased muscle hypertonicity   From eval:  Program Notes Bird dog row 2x12 15lbs   Exercises - Full Plank with Scapular Protraction Retraction AROM  - 1 x daily - 4-5 x weekly - 2 sets - 10 reps - 5 hold - Plank Walk Outs  - 1 x daily - 4-5 x weekly - 2 sets - 10 reps    PATIENT EDUCATION:  Education details: MOI, diagnosis, prognosis, anatomy, exercise progression, DOMS expectations, muscle firing,  envelope of function, HEP, POC  Person educated: Patient Education method: Explanation, Demonstration, Tactile cues, Verbal cues, and Handouts Education comprehension: verbalized understanding, returned demonstration, verbal cues required, tactile cues required, and needs further education  HOME EXERCISE PROGRAM: Access Code: [email or web address] URL: [email or web address] Date: 02/28/2022 Prepared by: Vernon Prey April Kirstie Peri  Program Notes Bird dog row 2x12 15lbs   Exercises - Full Plank with Scapular Protraction Retraction AROM  - 1 x daily - 4-5 x weekly - 2 sets - 10 reps - 5 hold - Plank Walk Outs  - 1 x daily - 4-5 x  weekly - 2 sets - 10 reps (added scapular clock with red TB at end range) - Latissimus Dorsi Stretch at Wall  - 1 x daily - 7 x weekly - 2 sets - 30 sec hold - Prone W Scapular Retraction  - 1 x daily - 7 x weekly - 2 sets - 10 reps - 3 sec hold - Prone Single Arm Shoulder Y  - 1 x daily - 7 x weekly - 2 sets - 10 reps  ASSESSMENT:  CLINICAL IMPRESSION: Performed  FOTO this session. Trial of TPDN with e-stim initiated on thoracic paraspinal and regular TPDN on lats. Reviewed HEP and modified accordingly to continue to work on scapular stability. Pt appears to compensate for serratus anterior weakness with lat overactivity during exercises -- will need to monitor.    GOALS:   SHORT TERM GOALS: Target date: 04/04/2022   Pt will become independent with HEP in order to demonstrate synthesis of PT education.  Goal status: INITIAL  2.  Pt will be able to demonstrate ability to perform CKC and OH ROM without scapular winging  in order to demonstrate functional improvement in UE/LE function for self-care and house hold duties.   Goal status: INITIAL     LONG TERM GOALS: Target date: 05/16/2022   Pt  will become independent with final HEP in order to demonstrate synthesis of PT education.   Goal status: INITIAL  2.  Pt will be able to demonstrate ability to return to full sport and training without midback pain in order to demonstrate functional improvement in midback function for pain free exercise/ recreation.   Goal status: INITIAL  3.  Pt will be able to demonstrate ability to catch/land/push without pain in order to demonstrate functional improvement and tolerance to low level plyometric loading.   Goal status: INITIAL    PLAN:  PT FREQUENCY: 1-2x/week  PT DURATION: 12 weeks (likely to D/C in 6-8 wks)  PLANNED INTERVENTIONS: Therapeutic exercises, Therapeutic activity, Neuromuscular re-education, Balance training, Gait training, Patient/Family education, Self Care, Joint mobilization, Joint manipulation, Orthotic/Fit training, Aquatic Therapy, Dry Needling, Electrical stimulation, Spinal manipulation, Spinal mobilization, Cryotherapy, Moist heat, scar mobilization, Splintting, Taping, Vasopneumatic device, Traction, Ultrasound, Biofeedback, Ionotophoresis 4mg /ml Dexamethasone, Manual therapy, and Re-evaluation.  PLAN FOR NEXT SESSION:  scapular stability, protraction strength, bear walks, push up plus; STM and TPDN PRN for mid thoracic region (consider lats)   Aldrick Derrig April Ma L Krizia Flight, PT 02/28/2022, 8:04 AM

## 2022-03-07 ENCOUNTER — Ambulatory Visit (HOSPITAL_BASED_OUTPATIENT_CLINIC_OR_DEPARTMENT_OTHER): Payer: BC Managed Care – PPO | Admitting: Physical Therapy

## 2022-03-13 ENCOUNTER — Encounter (HOSPITAL_BASED_OUTPATIENT_CLINIC_OR_DEPARTMENT_OTHER): Payer: Self-pay

## 2022-03-13 ENCOUNTER — Ambulatory Visit (HOSPITAL_BASED_OUTPATIENT_CLINIC_OR_DEPARTMENT_OTHER): Payer: BC Managed Care – PPO | Attending: Physician Assistant

## 2022-03-13 DIAGNOSIS — M6281 Muscle weakness (generalized): Secondary | ICD-10-CM | POA: Diagnosis not present

## 2022-03-13 DIAGNOSIS — M546 Pain in thoracic spine: Secondary | ICD-10-CM | POA: Diagnosis not present

## 2022-03-13 NOTE — Therapy (Deleted)
OUTPATIENT PHYSICAL THERAPY THORACOLUMBAR EVALUATION   Patient Name: Sankalp Ferrell MRN: [id number] DOB:1990/09/15, 32 y.o., male Today's Date: 02/21/2022  END OF SESSION:  PT End of Session - 02/21/22 1227     Visit Number 1    Number of Visits 16    Date for PT Re-Evaluation 05/22/22    Authorization Type BCBS    PT Start Time [id number]   arrives late   PT Stop Time 0845    PT Time Calculation (min) 35 min    Activity Tolerance Patient tolerated treatment well    Behavior During Therapy [facility] for tasks assessed/performed             History reviewed. No pertinent past medical history. History reviewed. No pertinent surgical history. Patient Active Problem List   Diagnosis Date Noted   Solitary pulmonary nodule 10/02/2018   Dyspnea 10/02/2018   Scoliosis deformity of spine 09/21/2017    PCP: [name], PA   REFERRING PROVIDER: [name], PA   REFERRING DIAG:  Diagnosis  M54.6,G89.29 (ICD-10-CM) - Chronic right-sided thoracic back pain    Rationale for Evaluation and Treatment: Rehabilitation  THERAPY DIAG:  Pain in thoracic spine - Plan: PT plan of care cert/re-cert  Muscle weakness (generalized) - Plan: PT plan of care cert/re-cert  ONSET DATE: 8811  SUBJECTIVE:                                                                                                                                                                                           SUBJECTIVE STATEMENT: Pt states the pain is in the mid-back on the R side in the paraspinal muscle and R side.  Pt has had DN in the past before for it that has helped. The pain will go from his shoulder down his R hip. Pt does a lot of stretching from PT before. 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 Does also do grappling. Starting when he was 77.  Sitting all day and not working out will tight. Pushing/pressing motions also hurt the day after. Pt has decreased sensation down into  the lateral 3 fingers but sensation will come back after he pops his back. Denies cancer red flags.  PERTINENT HISTORY:  L and R clavicle fractures; partially torn RC (unknown laterality)  PAIN:  Are you having pain? Yes: NPRS scale: 2-3/10; 5-6/10 Pain location: R mid back and into shoulder Pain description: dull, numbing, tingling pain Aggravating factors: R cross/jab, pressing/pushing workouts  Relieving factors: meds, stretching, rest  PRECAUTIONS: None  WEIGHT BEARING RESTRICTIONS: No  FALLS:  Has patient fallen in last 6  months? No  LIVING ENVIRONMENT: Lives with: lives with their spouse Lives in: House/apartment Stairs: Yes Has following equipment at home: None  OCCUPATION: attorney, standing desk   PLOF: Independent  PATIENT GOALS: reduce pain reducing R side tightness.   OBJECTIVE:   DIAGNOSTIC FINDINGS:  N/A  PATIENT SURVEYS:  FOTO will give at next, limited by time at eval   COGNITION: Overall cognitive status: Within functional limits for tasks assessed     SENSATION: Light touch: Impaired    POSTURE: No Significant postural limitations  PALPATION: TTP of periscapular region, R T/S and L/S paraspinals, R UT  LUMBAR ROM: WNL at T/S and L/S  UE ROM: WNL without pain; strong scapular winging bilat R>L with OH and WB positions  MMT Right   Left   Shoulder flexion 4+/5 4+/5  Shoulder protraction 4/5 4+/5  Shoulder abduction 4+/5 4+/5  Shoulder adduction    Shoulder internal rotation 4+/5 4+/5  Shoulder external rotation 4/5 4+/5  (Blank rows = not tested)   TODAY'S TREATMENT:                                                                                                                              DATE:   Program Notes Bird dog row 2x12 15lbs   Exercises - Full Plank with Scapular Protraction Retraction AROM  - 1 x daily - 4-5 x weekly - 2 sets - 10 reps - 5 hold - Plank Walk Outs  - 1 x daily - 4-5 x weekly - 2 sets - 10 reps     PATIENT EDUCATION:  Education details: MOI, diagnosis, prognosis, anatomy, exercise progression, DOMS expectations, muscle firing,  envelope of function, HEP, POC  Person educated: Patient Education method: Explanation, Demonstration, Tactile cues, Verbal cues, and Handouts Education comprehension: verbalized understanding, returned demonstration, verbal cues required, tactile cues required, and needs further education  HOME EXERCISE PROGRAM:  Access Code: [email or web address] URL: [email or web address] Date: 02/21/2022 Prepared by: Daleen Bo  ASSESSMENT:  CLINICAL IMPRESSION: Patient is a 32 y.o. male who was seen today for physical therapy evaluation and treatment for c/c of R  thoracic and neck pain. Pt s/s consistent with scapular weakness and lack of motor control of the R UE. Pt has repetitive motion tasks with MMA that require strong protraction and mid back stability likely contributing to pain. Pt has good ROM and mobility with strength and soft tissue hypertonicity likely contributing to pain. Session limited by time but will continue to test for CKC motor control and stability at future sessions. Plan to give FOTO at next. Pt would benefit from continued skilled therapy in order to reach goals and maximize functional thoracic strength and ROM for full return to PLOF.   OBJECTIVE IMPAIRMENTS: decreased knowledge of condition, decreased strength, increased muscle spasms, impaired UE functional use, improper body mechanics, postural dysfunction, and pain.   ACTIVITY LIMITATIONS: carrying, lifting, and pushing  PARTICIPATION LIMITATIONS: community activity and exercise  PERSONAL  FACTORS: Time since onset of injury/illness/exacerbation are also affecting patient's functional outcome.   REHAB POTENTIAL: Good  CLINICAL DECISION MAKING: Stable/uncomplicated  EVALUATION COMPLEXITY: Low   GOALS:   SHORT TERM GOALS: Target date: 04/04/2022   Pt will become independent  with HEP in order to demonstrate synthesis of PT education.  Goal status: INITIAL  2.  Pt will be able to demonstrate ability to perform CKC and OH ROM without scapular winging  in order to demonstrate functional improvement in UE/LE function for self-care and house hold duties.   Goal status: INITIAL     LONG TERM GOALS: Target date: 05/16/2022   Pt  will become independent with final HEP in order to demonstrate synthesis of PT education.   Goal status: INITIAL  2.  Pt will be able to demonstrate ability to return to full sport and training without midback pain in order to demonstrate functional improvement in midback function for pain free exercise/ recreation.   Goal status: INITIAL  3.  Pt will be able to demonstrate ability to catch/land/push without pain in order to demonstrate functional improvement and tolerance to low level plyometric loading.   Goal status: INITIAL    PLAN:  PT FREQUENCY: 1-2x/week  PT DURATION: 12 weeks (likely to D/C in 6-8 wks)  PLANNED INTERVENTIONS: Therapeutic exercises, Therapeutic activity, Neuromuscular re-education, Balance training, Gait training, Patient/Family education, Self Care, Joint mobilization, Joint manipulation, Orthotic/Fit training, Aquatic Therapy, Dry Needling, Electrical stimulation, Spinal manipulation, Spinal mobilization, Cryotherapy, Moist heat, scar mobilization, Splintting, Taping, Vasopneumatic device, Traction, Ultrasound, Biofeedback, Ionotophoresis 4mg /ml Dexamethasone, Manual therapy, and Re-evaluation.  PLAN FOR NEXT SESSION: scapular stability, protraction strength, bear walks, push up plus; STM and TPDN PRN for mid thoracic region   Daleen Bo, PT 02/21/2022, 12:45 PM  OUTPATIENT PHYSICAL THERAPY TREATMENT   Patient Name: Cleamon Germana MRN: [id number] DOB:1990/12/18, 32 y.o., male Today's Date: 02/28/2022  END OF SESSION:  PT End of Session - 02/28/22 0803     Visit Number 2    Number of Visits 16     Date for PT Re-Evaluation 05/22/22    Authorization Type BCBS    PT Start Time 0803    PT Stop Time 0845    PT Time Calculation (min) 42 min    Activity Tolerance Patient tolerated treatment well    Behavior During Therapy [facility] for tasks assessed/performed              History reviewed. No pertinent past medical history. History reviewed. No pertinent surgical history. Patient Active Problem List   Diagnosis Date Noted   Solitary pulmonary nodule 10/02/2018   Dyspnea 10/02/2018   Scoliosis deformity of spine 09/21/2017    PCP: [name], PA   REFERRING PROVIDER: [name], PA   REFERRING DIAG:  Diagnosis  M54.6,G89.29 (ICD-10-CM) - Chronic right-sided thoracic back pain    Rationale for Evaluation and Treatment: Rehabilitation  THERAPY DIAG:  Pain in thoracic spine  Muscle weakness (generalized)  ONSET DATE: 2020  SUBJECTIVE:  SUBJECTIVE STATEMENT: No problems with the exercises. A little bit of tightness and pain this morning.   PERTINENT HISTORY:  L and R clavicle fractures; partially torn RC (unknown laterality)  From eval: Pt states the pain is in the mid-back on the R side in the paraspinal muscle and R side.  Pt has had DN in the past before for it that has helped. The pain will go from his shoulder down his R hip. Pt does a lot of stretching from PT before. 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 Does also do grappling. Starting when he was 16.  Sitting all day and not working out will tight. Pushing/pressing motions also hurt the day after. Pt has decreased sensation down into the lateral 3 fingers but sensation will come back after he pops his back. Denies cancer red flags.  PAIN:  Are you having pain? Yes: NPRS scale: 2-3/10;  5-6/10 Pain location: R mid back and into shoulder Pain description: dull, numbing, tingling pain Aggravating factors: R cross/jab, pressing/pushing workouts  Relieving factors: meds, stretching, rest  PRECAUTIONS: None  WEIGHT BEARING RESTRICTIONS: No  FALLS:  Has patient fallen in last 6 months? No   OCCUPATION: attorney, standing desk    PATIENT GOALS: reduce pain reducing R side tightness.   OBJECTIVE:   PATIENT SURVEYS:  FOTO 72; predicted 70  PALPATION: TTP of periscapular region, R T/S and L/S paraspinals, R UT  UE ROM: WNL without pain; strong scapular winging bilat R>L with OH and WB positions  MMT Right   Left   Shoulder flexion 4+/5 4+/5  Shoulder protraction 4/5 4+/5  Shoulder abduction 4+/5 4+/5  Shoulder adduction    Shoulder internal rotation 4+/5 4+/5  Shoulder external rotation 4/5 4+/5  (Blank rows = not tested)   TODAY'S TREATMENT:                                                                                                                              DATE:   03/13/22 THEREX Elliptical warm up x 6 min Bird dog with 15# KB row 2x12 Full plank with scap retraction/protraction 2x10x5 sec (could feel pulling in posterior shoulder on R with protraction, improved after lat stretching) Lat stretch against wall x30 sec Doorway pec stretch 65 deg and 90 deg x 30 sec each Plank walk outs with red TB for scapular clock at end range 2x10 Prone "W" 10x5 sec holds (PT assist to obtain correct scapular position) Prone "Y" 10x5 sec holds (PT assist to obtain correct scapular position) Side plank Plank shoulder taps BOSU plank circles Plank walks Cable column protraction PNF D2 flexion  Palloff press Resisted trunk rotation    02/28/22 THEREX Elliptical warm up x 6 min Bird dog with 15# KB row 2x12 Full plank with scap retraction/protraction 2x10x5 sec (could feel pulling in posterior shoulder on R with protraction, improved after lat  stretching) Lat stretch against wall  x30 sec Doorway pec stretch 65 deg and 90 deg x 30 sec each Plank walk outs with red TB for scapular clock at end range 2x10 Prone "W" 10x5 sec holds (PT assist to obtain correct scapular position) Prone "Y" 10x5 sec holds (PT assist to obtain correct scapular position)   MANUAL THERAPY Skilled assessment and palpation for TPDN Trigger Point Dry-Needling  Treatment instructions: Expect mild to moderate muscle soreness. S/S of pneumothorax if dry needled over a lung field, and to seek immediate medical attention should they occur. Patient verbalized understanding of these instructions and education.  Patient Consent Given: Yes Education handout provided: Previously provided Muscles treated: R thoracic paraspinal, R lat Electrical stimulation performed: Yes Parameters:  2 milliamps, frequency 2, intensity to pt tolerance to obtain muscle twitch Treatment response/outcome: Twitch response, decreased muscle hypertonicity   From eval:  Program Notes Bird dog row 2x12 15lbs   Exercises - Full Plank with Scapular Protraction Retraction AROM  - 1 x daily - 4-5 x weekly - 2 sets - 10 reps - 5 hold - Plank Walk Outs  - 1 x daily - 4-5 x weekly - 2 sets - 10 reps    PATIENT EDUCATION:  Education details: MOI, diagnosis, prognosis, anatomy, exercise progression, DOMS expectations, muscle firing,  envelope of function, HEP, POC  Person educated: Patient Education method: Explanation, Demonstration, Tactile cues, Verbal cues, and Handouts Education comprehension: verbalized understanding, returned demonstration, verbal cues required, tactile cues required, and needs further education  HOME EXERCISE PROGRAM: Access Code: [email or web address] URL: [email or web address] Date: 02/28/2022 Prepared by: Estill Bamberg April Thurnell Garbe  Program Notes Bird dog row 2x12 15lbs   Exercises - Full Plank with Scapular Protraction Retraction AROM  - 1 x daily - 4-5  x weekly - 2 sets - 10 reps - 5 hold - Plank Walk Outs  - 1 x daily - 4-5 x weekly - 2 sets - 10 reps (added scapular clock with red TB at end range) - Latissimus Dorsi Stretch at Wall  - 1 x daily - 7 x weekly - 2 sets - 30 sec hold - Prone W Scapular Retraction  - 1 x daily - 7 x weekly - 2 sets - 10 reps - 3 sec hold - Prone Single Arm Shoulder Y  - 1 x daily - 7 x weekly - 2 sets - 10 reps  ASSESSMENT:  CLINICAL IMPRESSION: Performed FOTO this session. Trial of TPDN with e-stim initiated on thoracic paraspinal and regular TPDN on lats. Reviewed HEP and modified accordingly to continue to work on scapular stability. Pt appears to compensate for serratus anterior weakness with lat overactivity during exercises -- will need to monitor.    GOALS:   SHORT TERM GOALS: Target date: 04/04/2022   Pt will become independent with HEP in order to demonstrate synthesis of PT education.  Goal status: INITIAL  2.  Pt will be able to demonstrate ability to perform CKC and OH ROM without scapular winging  in order to demonstrate functional improvement in UE/LE function for self-care and house hold duties.   Goal status: INITIAL     LONG TERM GOALS: Target date: 05/16/2022   Pt  will become independent with final HEP in order to demonstrate synthesis of PT education.   Goal status: INITIAL  2.  Pt will be able to demonstrate ability to return to full sport and training without midback pain in order to demonstrate functional improvement in midback function for pain free exercise/ recreation.  Goal status: INITIAL  3.  Pt will be able to demonstrate ability to catch/land/push without pain in order to demonstrate functional improvement and tolerance to low level plyometric loading.   Goal status: INITIAL    PLAN:  PT FREQUENCY: 1-2x/week  PT DURATION: 12 weeks (likely to D/C in 6-8 wks)  PLANNED INTERVENTIONS: Therapeutic exercises, Therapeutic activity, Neuromuscular re-education,  Balance training, Gait training, Patient/Family education, Self Care, Joint mobilization, Joint manipulation, Orthotic/Fit training, Aquatic Therapy, Dry Needling, Electrical stimulation, Spinal manipulation, Spinal mobilization, Cryotherapy, Moist heat, scar mobilization, Splintting, Taping, Vasopneumatic device, Traction, Ultrasound, Biofeedback, Ionotophoresis 4mg /ml Dexamethasone, Manual therapy, and Re-evaluation.  PLAN FOR NEXT SESSION: scapular stability, protraction strength, bear walks, push up plus; STM and TPDN PRN for mid thoracic region (consider lats)   Graceann Congress Naisha Wisdom, PTA 03/13/2022, 8:04 AM

## 2022-03-13 NOTE — Therapy (Signed)
OUTPATIENT PHYSICAL THERAPY TREATMENT   Patient Name: Deshaun Weisinger MRN: [id number] DOB:1990-10-25, 32 y.o., male Today's Date: 03/13/2022  END OF SESSION:  PT End of Session - 03/13/22 0805     Visit Number 3    Number of Visits 16    Date for PT Re-Evaluation 05/22/22    Authorization Type BCBS    PT Start Time 0803    PT Stop Time 0843    PT Time Calculation (min) 40 min    Activity Tolerance Patient tolerated treatment well    Behavior During Therapy [facility] for tasks assessed/performed              History reviewed. No pertinent past medical history. History reviewed. No pertinent surgical history. Patient Active Problem List   Diagnosis Date Noted   Solitary pulmonary nodule 10/02/2018   Dyspnea 10/02/2018   Scoliosis deformity of spine 09/21/2017    PCP: [name], PA   REFERRING PROVIDER: [name], PA   REFERRING DIAG:  Diagnosis  M54.6,G89.29 (ICD-10-CM) - Chronic right-sided thoracic back pain    Rationale for Evaluation and Treatment: Rehabilitation  THERAPY DIAG:  Pain in thoracic spine  Muscle weakness (generalized)  ONSET DATE: 2020  SUBJECTIVE:                                                                                                                                                                                           SUBJECTIVE STATEMENT: Feels benefit from exercises so far. No pain at entry.  Has concerns about his hernia surgery and sees MD regarding this next week.   PERTINENT HISTORY:  L and R clavicle fractures; partially torn RC (unknown laterality)  From eval: Pt states the pain is in the mid-back on the R side in the paraspinal muscle and R side.  Pt has had DN in the past before for it that has helped. The pain will go from his shoulder down his R hip. Pt does a lot of stretching from PT before. 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 Does also do grappling. Starting when he was 12.   Sitting all day and not working out will tight. Pushing/pressing motions also hurt the day after. Pt has decreased sensation down into the lateral 3 fingers but sensation will come back after he pops his back. Denies cancer red flags.  PAIN:  Are you having pain? Yes: NPRS scale: 2-3/10; 5-6/10 Pain location: R mid back and into shoulder Pain description: dull, numbing, tingling pain Aggravating factors: R cross/jab, pressing/pushing workouts  Relieving factors: meds, stretching, rest  PRECAUTIONS: None  WEIGHT BEARING  RESTRICTIONS: No  FALLS:  Has patient fallen in last 6 months? No   OCCUPATION: attorney, standing desk    PATIENT GOALS: reduce pain reducing R side tightness.   OBJECTIVE:   PATIENT SURVEYS:  FOTO 49; predicted 70  PALPATION: TTP of periscapular region, R T/S and L/S paraspinals, R UT  UE ROM: WNL without pain; strong scapular winging bilat R>L with OH and WB positions  MMT Right   Left   Shoulder flexion 4+/5 4+/5  Shoulder protraction 4/5 4+/5  Shoulder abduction 4+/5 4+/5  Shoulder adduction    Shoulder internal rotation 4+/5 4+/5  Shoulder external rotation 4/5 4+/5  (Blank rows = not tested)   TODAY'S TREATMENT:                                                                                                                              DATE:   03/13/22 THEREX Elliptical warm up x 6 min Lat stretch against wall 3x30 sec R Doorway pec stretch 65 deg and 90 deg x 30 sec each Plank walk outs with red TB for scapular clock at end range x10 Prone "W" 10x5 sec holds, tactile cues.  Prone "Y" 10x5 sec holds, tactile cues  Side plank 20sec x3 R (on elbow)  Cable column protraction 10lbs x10 Cable column row 15lbs 2x10 Palloff press- 15lbs x10ea Farmer carry unilateral- 15# KB x129ft Reverse wall push up 2x10  02/28/22 THEREX Elliptical warm up x 6 min Bird dog with 15# KB row 2x12 Full plank with scap retraction/protraction 2x10x5 sec (could  feel pulling in posterior shoulder on R with protraction, improved after lat stretching) Lat stretch against wall x30 sec Doorway pec stretch 65 deg and 90 deg x 30 sec each Plank walk outs with red TB for scapular clock at end range 2x10 Prone "W" 10x5 sec holds (PT assist to obtain correct scapular position) Prone "Y" 10x5 sec holds (PT assist to obtain correct scapular position)   MANUAL THERAPY Skilled assessment and palpation for TPDN Trigger Point Dry-Needling  Treatment instructions: Expect mild to moderate muscle soreness. S/S of pneumothorax if dry needled over a lung field, and to seek immediate medical attention should they occur. Patient verbalized understanding of these instructions and education.  Patient Consent Given: Yes Education handout provided: Previously provided Muscles treated: R thoracic paraspinal, R lat Electrical stimulation performed: Yes Parameters:  2 milliamps, frequency 2, intensity to pt tolerance to obtain muscle twitch Treatment response/outcome: Twitch response, decreased muscle hypertonicity   From eval:  Program Notes Bird dog row 2x12 15lbs   Exercises - Full Plank with Scapular Protraction Retraction AROM  - 1 x daily - 4-5 x weekly - 2 sets - 10 reps - 5 hold - Plank Walk Outs  - 1 x daily - 4-5 x weekly - 2 sets - 10 reps    PATIENT EDUCATION:  Education details: MOI, diagnosis, prognosis, anatomy, exercise progression, DOMS expectations, muscle firing,  envelope of function, HEP, POC  Person educated: Patient Education method: Explanation, Demonstration, Tactile cues, Verbal cues, and Handouts Education comprehension: verbalized understanding, returned demonstration, verbal cues required, tactile cues required, and needs further education  HOME EXERCISE PROGRAM: Access Code: [email or web address] URL: [email or web address] Date: 02/28/2022 Prepared by: Estill Bamberg April Thurnell Garbe  Program Notes Bird dog row 2x12 15lbs    Exercises - Full Plank with Scapular Protraction Retraction AROM  - 1 x daily - 4-5 x weekly - 2 sets - 10 reps - 5 hold - Plank Walk Outs  - 1 x daily - 4-5 x weekly - 2 sets - 10 reps (added scapular clock with red TB at end range) - Latissimus Dorsi Stretch at Wall  - 1 x daily - 7 x weekly - 2 sets - 30 sec hold - Prone W Scapular Retraction  - 1 x daily - 7 x weekly - 2 sets - 10 reps - 3 sec hold - Prone Single Arm Shoulder Y  - 1 x daily - 7 x weekly - 2 sets - 10 reps  ASSESSMENT:  CLINICAL IMPRESSION: Continued to work on strengthening and stability for R shoulder girdle. Core weakness noted, especially evident with side planks. No pain with exercises. Some lat disocmfort with lank walkouts with RTB.  Updated HEP to include side planks and reverse wall push ups. Will continue to progress as tolerated.   GOALS:   SHORT TERM GOALS: Target date: 04/04/2022   Pt will become independent with HEP in order to demonstrate synthesis of PT education.  Goal status: INITIAL  2.  Pt will be able to demonstrate ability to perform CKC and OH ROM without scapular winging  in order to demonstrate functional improvement in UE/LE function for self-care and house hold duties.   Goal status: INITIAL     LONG TERM GOALS: Target date: 05/16/2022   Pt  will become independent with final HEP in order to demonstrate synthesis of PT education.   Goal status: MET 03/13/22  2.  Pt will be able to demonstrate ability to return to full sport and training without midback pain in order to demonstrate functional improvement in midback function for pain free exercise/ recreation.   Goal status: INITIAL  3.  Pt will be able to demonstrate ability to catch/land/push without pain in order to demonstrate functional improvement and tolerance to low level plyometric loading.   Goal status: INITIAL    PLAN:  PT FREQUENCY: 1-2x/week  PT DURATION: 12 weeks (likely to D/C in 6-8 wks)  PLANNED  INTERVENTIONS: Therapeutic exercises, Therapeutic activity, Neuromuscular re-education, Balance training, Gait training, Patient/Family education, Self Care, Joint mobilization, Joint manipulation, Orthotic/Fit training, Aquatic Therapy, Dry Needling, Electrical stimulation, Spinal manipulation, Spinal mobilization, Cryotherapy, Moist heat, scar mobilization, Splintting, Taping, Vasopneumatic device, Traction, Ultrasound, Biofeedback, Ionotophoresis 4mg /ml Dexamethasone, Manual therapy, and Re-evaluation.  PLAN FOR NEXT SESSION: scapular stability, protraction strength, bear walks, push up plus; STM and TPDN PRN for mid thoracic region (consider lats)   Graceann Congress Micco Bourbeau, PTA 03/13/2022, 9:37 AM

## 2022-03-15 ENCOUNTER — Encounter: Payer: Self-pay | Admitting: Physician Assistant

## 2022-03-19 DIAGNOSIS — Z8719 Personal history of other diseases of the digestive system: Secondary | ICD-10-CM | POA: Diagnosis not present

## 2022-03-19 DIAGNOSIS — N50812 Left testicular pain: Secondary | ICD-10-CM | POA: Diagnosis not present

## 2022-03-19 DIAGNOSIS — Z9889 Other specified postprocedural states: Secondary | ICD-10-CM | POA: Diagnosis not present

## 2022-03-20 ENCOUNTER — Ambulatory Visit (HOSPITAL_BASED_OUTPATIENT_CLINIC_OR_DEPARTMENT_OTHER): Payer: BC Managed Care – PPO

## 2022-03-22 ENCOUNTER — Other Ambulatory Visit: Payer: Self-pay | Admitting: General Surgery

## 2022-03-22 DIAGNOSIS — N50812 Left testicular pain: Secondary | ICD-10-CM

## 2022-03-22 DIAGNOSIS — R1032 Left lower quadrant pain: Secondary | ICD-10-CM

## 2022-03-29 ENCOUNTER — Encounter (HOSPITAL_BASED_OUTPATIENT_CLINIC_OR_DEPARTMENT_OTHER): Payer: BC Managed Care – PPO | Admitting: Physical Therapy

## 2022-04-03 DIAGNOSIS — Z79899 Other long term (current) drug therapy: Secondary | ICD-10-CM | POA: Diagnosis not present

## 2022-04-03 DIAGNOSIS — F902 Attention-deficit hyperactivity disorder, combined type: Secondary | ICD-10-CM | POA: Diagnosis not present

## 2022-04-04 ENCOUNTER — Encounter (HOSPITAL_BASED_OUTPATIENT_CLINIC_OR_DEPARTMENT_OTHER): Payer: BC Managed Care – PPO | Admitting: Physical Therapy

## 2022-04-05 ENCOUNTER — Encounter (HOSPITAL_BASED_OUTPATIENT_CLINIC_OR_DEPARTMENT_OTHER): Payer: BC Managed Care – PPO | Admitting: Physical Therapy

## 2022-04-19 ENCOUNTER — Ambulatory Visit
Admission: RE | Admit: 2022-04-19 | Discharge: 2022-04-19 | Disposition: A | Discharge status: Home / Self Care | Payer: BC Managed Care – PPO | Source: Ambulatory Visit | Attending: General Surgery | Admitting: General Surgery

## 2022-04-19 ENCOUNTER — Encounter: Payer: Self-pay | Admitting: Physician Assistant

## 2022-04-19 DIAGNOSIS — R1030 Lower abdominal pain, unspecified: Secondary | ICD-10-CM | POA: Diagnosis not present

## 2022-04-19 DIAGNOSIS — N50812 Left testicular pain: Secondary | ICD-10-CM | POA: Diagnosis not present

## 2022-04-19 DIAGNOSIS — R1032 Left lower quadrant pain: Secondary | ICD-10-CM

## 2022-05-10 ENCOUNTER — Encounter: Payer: Self-pay | Admitting: Physician Assistant

## 2022-05-11 ENCOUNTER — Ambulatory Visit (INDEPENDENT_AMBULATORY_CARE_PROVIDER_SITE_OTHER): Payer: BC Managed Care – PPO | Admitting: Physician Assistant

## 2022-05-11 ENCOUNTER — Encounter: Payer: Self-pay | Admitting: Physician Assistant

## 2022-05-11 VITALS — BP 110/70 | HR 96 | Temp 98.2°F | Ht 72.0 in | Wt 181.0 lb

## 2022-05-11 DIAGNOSIS — S99922A Unspecified injury of left foot, initial encounter: Secondary | ICD-10-CM | POA: Diagnosis not present

## 2022-05-11 DIAGNOSIS — R52 Pain, unspecified: Secondary | ICD-10-CM

## 2022-05-11 DIAGNOSIS — J111 Influenza due to unidentified influenza virus with other respiratory manifestations: Secondary | ICD-10-CM | POA: Diagnosis not present

## 2022-05-11 LAB — POC INFLUENZA A&B (BINAX/QUICKVUE)
Influenza A, POC: POSITIVE — AB
Influenza B, POC: NEGATIVE

## 2022-05-11 LAB — POCT RAPID STREP A (OFFICE): Rapid Strep A Screen: NEGATIVE

## 2022-05-11 MED ORDER — AMOXICILLIN-POT CLAVULANATE 875-125 MG PO TABS: 1.0000 | ORAL_TABLET | Freq: Two times a day (BID) | ORAL | 0 refills | Status: DC
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
## 2022-05-11 NOTE — Patient Instructions (Signed)
It was great to see you!  Start Nasal saline spray (i.e., Simply Saline) or nasal saline lavage (i.e., NeilMed) is recommended as needed and prior to medicated nasal sprays. Consider Flonase (generic is fine) 1 spray twice a day. Trial 12-hour delsym over the counter cough syrup (generic is fine!)  Add plain mucinex to allow you to get more of your congestion out Push fluids  If cough continues to worsen, start antibiotic  Keep me posted on your toe -- we can always refer you to our sports medicine doctor to further eval  Take care,  Jarold Motto PA-C

## 2022-05-22 DIAGNOSIS — N5089 Other specified disorders of the male genital organs: Secondary | ICD-10-CM | POA: Diagnosis not present

## 2022-05-22 DIAGNOSIS — N50812 Left testicular pain: Secondary | ICD-10-CM | POA: Diagnosis not present

## 2022-07-05 DIAGNOSIS — N50812 Left testicular pain: Secondary | ICD-10-CM | POA: Diagnosis not present

## 2022-09-13 DIAGNOSIS — N50812 Left testicular pain: Secondary | ICD-10-CM | POA: Diagnosis not present

## 2022-10-16 DIAGNOSIS — F902 Attention-deficit hyperactivity disorder, combined type: Secondary | ICD-10-CM | POA: Diagnosis not present

## 2022-10-16 DIAGNOSIS — Z79899 Other long term (current) drug therapy: Secondary | ICD-10-CM | POA: Diagnosis not present

## 2022-10-17 DIAGNOSIS — F902 Attention-deficit hyperactivity disorder, combined type: Secondary | ICD-10-CM | POA: Diagnosis not present

## 2022-10-31 DIAGNOSIS — N50812 Left testicular pain: Secondary | ICD-10-CM | POA: Diagnosis not present

## 2022-12-19 DIAGNOSIS — M62838 Other muscle spasm: Secondary | ICD-10-CM | POA: Diagnosis not present

## 2022-12-19 DIAGNOSIS — M6289 Other specified disorders of muscle: Secondary | ICD-10-CM | POA: Diagnosis not present

## 2022-12-19 DIAGNOSIS — N3943 Post-void dribbling: Secondary | ICD-10-CM | POA: Diagnosis not present

## 2022-12-19 DIAGNOSIS — N50812 Left testicular pain: Secondary | ICD-10-CM | POA: Diagnosis not present

## 2023-01-08 DIAGNOSIS — R350 Frequency of micturition: Secondary | ICD-10-CM | POA: Diagnosis not present

## 2023-01-08 DIAGNOSIS — N50812 Left testicular pain: Secondary | ICD-10-CM | POA: Diagnosis not present

## 2023-01-08 DIAGNOSIS — M62838 Other muscle spasm: Secondary | ICD-10-CM | POA: Diagnosis not present

## 2023-01-08 DIAGNOSIS — M6289 Other specified disorders of muscle: Secondary | ICD-10-CM | POA: Diagnosis not present

## 2023-02-14 DIAGNOSIS — R350 Frequency of micturition: Secondary | ICD-10-CM | POA: Diagnosis not present

## 2023-02-14 DIAGNOSIS — M62838 Other muscle spasm: Secondary | ICD-10-CM | POA: Diagnosis not present

## 2023-02-14 DIAGNOSIS — N50812 Left testicular pain: Secondary | ICD-10-CM | POA: Diagnosis not present

## 2023-02-14 DIAGNOSIS — M6289 Other specified disorders of muscle: Secondary | ICD-10-CM | POA: Diagnosis not present

## 2023-03-14 DIAGNOSIS — M6289 Other specified disorders of muscle: Secondary | ICD-10-CM | POA: Diagnosis not present

## 2023-03-14 DIAGNOSIS — R350 Frequency of micturition: Secondary | ICD-10-CM | POA: Diagnosis not present

## 2023-03-14 DIAGNOSIS — N3943 Post-void dribbling: Secondary | ICD-10-CM | POA: Diagnosis not present

## 2023-03-14 DIAGNOSIS — M62838 Other muscle spasm: Secondary | ICD-10-CM | POA: Diagnosis not present

## 2023-03-18 ENCOUNTER — Telehealth: Payer: Self-pay | Admitting: *Deleted

## 2023-03-18 NOTE — Telephone Encounter (Signed)
 Please see message and advise

## 2023-03-18 NOTE — Telephone Encounter (Signed)
 Copied from CRM [id number]. Topic: Clinical - Medical Advice >> Mar 18, 2023 11:06 AM Allyne Areola wrote: Reason for CRM: Patient is calling to see if Seth Shields recommends seeing ortho Gilberto Labella due to a possible rotator cuff dislocation on the left shoulder. Patient is scheduled to see Seth Shields on 03/19/2023 but would prefer to see the specialist.

## 2023-03-18 NOTE — Telephone Encounter (Signed)
 Spoke to pt told him Seth Shields said she recommends you see a Specialist. Asked him do you need me to place a referral? Pt verbalized understanding and said he is already scheduled tomorrow with Seth Shields. He said someone call him earlier.

## 2023-03-19 ENCOUNTER — Other Ambulatory Visit: Payer: Self-pay

## 2023-03-19 ENCOUNTER — Ambulatory Visit (INDEPENDENT_AMBULATORY_CARE_PROVIDER_SITE_OTHER): Payer: BC Managed Care – PPO

## 2023-03-19 ENCOUNTER — Ambulatory Visit: Payer: BC Managed Care – PPO | Admitting: Physician Assistant

## 2023-03-19 ENCOUNTER — Ambulatory Visit: Payer: BC Managed Care – PPO | Admitting: Family Medicine

## 2023-03-19 ENCOUNTER — Encounter: Payer: Self-pay | Admitting: Family Medicine

## 2023-03-19 VITALS — BP 142/84 | HR 87 | Ht 72.0 in | Wt 184.0 lb

## 2023-03-19 DIAGNOSIS — S4990XA Unspecified injury of shoulder and upper arm, unspecified arm, initial encounter: Secondary | ICD-10-CM | POA: Diagnosis not present

## 2023-03-19 DIAGNOSIS — M542 Cervicalgia: Secondary | ICD-10-CM

## 2023-03-19 DIAGNOSIS — M25512 Pain in left shoulder: Secondary | ICD-10-CM

## 2023-03-19 DIAGNOSIS — M419 Scoliosis, unspecified: Secondary | ICD-10-CM | POA: Diagnosis not present

## 2023-03-19 MED ORDER — MELOXICAM 15 MG PO TABS: 15.0000 mg | ORAL_TABLET | Freq: Every day | ORAL | 0 refills | Status: DC

## 2023-03-19 MED ORDER — TIZANIDINE HCL 4 MG PO TABS: 4.0000 mg | ORAL_TABLET | Freq: Four times a day (QID) | ORAL | 1 refills | Status: DC | PRN

## 2023-03-19 NOTE — Patient Instructions (Addendum)
Thank you for coming in today.   Please get an Xray today before you leave   I've sent a prescription for meloxicam & tizanidine to your pharmacy.   You should hear from MRI scheduling within 1 week. If you do not hear please let me know.
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
## 2023-03-25 MED ORDER — GABAPENTIN 300 MG PO CAPS: 300.0000 mg | ORAL_CAPSULE | Freq: Three times a day (TID) | ORAL | 3 refills | Status: DC | PRN

## 2023-03-25 NOTE — Telephone Encounter (Signed)
 Forwarding to Dr. Denyse Amass to review and advise.

## 2023-03-26 ENCOUNTER — Ambulatory Visit: Payer: BC Managed Care – PPO

## 2023-03-26 ENCOUNTER — Ambulatory Visit: Payer: Self-pay

## 2023-03-26 ENCOUNTER — Ambulatory Visit (INDEPENDENT_AMBULATORY_CARE_PROVIDER_SITE_OTHER): Payer: BC Managed Care – PPO | Admitting: Sports Medicine

## 2023-03-26 DIAGNOSIS — G8929 Other chronic pain: Secondary | ICD-10-CM | POA: Diagnosis not present

## 2023-03-26 DIAGNOSIS — M25512 Pain in left shoulder: Secondary | ICD-10-CM | POA: Diagnosis not present

## 2023-03-26 DIAGNOSIS — S43432A Superior glenoid labrum lesion of left shoulder, initial encounter: Secondary | ICD-10-CM | POA: Diagnosis not present

## 2023-03-26 NOTE — Assessment & Plan Note (Signed)
 Shoulder subluxation with mixed martial arts, today I performed the injection for the MR arthrography, further management per primary treating provider.

## 2023-03-26 NOTE — Progress Notes (Signed)
    Procedures performed today:    Procedure: Real-time Ultrasound Guided gadolinium contrast injection of left glenohumeral joint Device: Samsung [id number]  Verbal informed consent obtained.  Time-out conducted.  Noted no overlying erythema, induration, or other signs of local infection.  Skin prepped in a sterile fashion.  Local anesthesia: Topical Ethyl chloride.  With sterile technique and under real time ultrasound guidance: Noted mild synovitis, 22-gauge spinal needle advanced into the glenohumeral joint from a posterior approach, I injected 1 cc kenalog 40, 2 cc lidocaine, 2 cc bupivacaine, syringe switched and 0.1 cc gadolinium injected, syringe again switched and 10 cc sterile saline used to fully distend the joint. Joint visualized and capsule seen distending confirming intra-articular placement of contrast material and medication. Completed without difficulty  Advised to call if fevers/chills, erythema, induration, drainage, or persistent bleeding.  Images permanently stored in PACS Impression: Technically successful ultrasound guided gadolinium contrast injection for MR arthrography.  Please see separate MR arthrogram report.   Independent interpretation of notes and tests performed by another provider:   None.  Brief History, Exam, Impression, and Recommendations:    Chronic left shoulder pain Shoulder subluxation with mixed martial arts, today I performed the injection for the MR arthrography, further management per primary treating provider.    ____________________________________________ Ihor Austin. Benjamin Stain, M.D., ABFM., CAQSM., AME. Primary Care and Sports Medicine [facility]  Adjunct Professor of Family Medicine  [employer]  Restaurant manager, fast food

## 2023-03-29 MED ORDER — TRIAMCINOLONE ACETONIDE 40 MG/ML IJ SUSP: 40.0000 mg | Freq: Once | INTRAMUSCULAR | Status: DC

## 2023-03-29 NOTE — Addendum Note (Signed)
 Addended by: Ernest Mallick A on: 03/29/2023 09:13 AM   Modules accepted: Orders

## 2023-04-01 ENCOUNTER — Other Ambulatory Visit: Payer: Self-pay | Admitting: Family Medicine

## 2023-04-01 ENCOUNTER — Encounter: Payer: Self-pay | Admitting: Family Medicine

## 2023-04-01 DIAGNOSIS — M25512 Pain in left shoulder: Secondary | ICD-10-CM

## 2023-04-01 DIAGNOSIS — M542 Cervicalgia: Secondary | ICD-10-CM

## 2023-04-01 DIAGNOSIS — M5010 Cervical disc disorder with radiculopathy, unspecified cervical region: Secondary | ICD-10-CM

## 2023-04-01 NOTE — Progress Notes (Signed)
Left shoulder x-ray looks normal to radiology

## 2023-04-01 NOTE — Telephone Encounter (Signed)
 Last OV 03/19/23 Next OV not scheduled  Last refill 03/19/23 Qty #30/1  Forwarding to Dr. Denyse Amass.

## 2023-04-01 NOTE — Telephone Encounter (Signed)
 Forwarding to Dr. Denyse Amass as Lorain Childes.

## 2023-04-01 NOTE — Progress Notes (Signed)
 Cervical spine x-ray shows a little bit of early or mild arthritis changes.  T

## 2023-04-02 ENCOUNTER — Other Ambulatory Visit: Payer: Self-pay

## 2023-04-02 DIAGNOSIS — M542 Cervicalgia: Secondary | ICD-10-CM

## 2023-04-03 ENCOUNTER — Telehealth: Payer: Self-pay | Admitting: Family Medicine

## 2023-04-03 NOTE — Telephone Encounter (Signed)
 Patient called about MRI that was ordered. [facility] told him it would be mid march before they could do his MRI. He is asking if we could send over medical records to [facility] to get an MRI with them.

## 2023-04-04 ENCOUNTER — Other Ambulatory Visit: Payer: Self-pay

## 2023-04-04 DIAGNOSIS — M542 Cervicalgia: Secondary | ICD-10-CM

## 2023-04-04 NOTE — Telephone Encounter (Signed)
 Checked w/ the [facility] location for sooner availability. She confirmed appointments for this weekend and Mon/Tues. Placed a new order for the [facility] location and communicated w the referral coordinator and pt. Pt was appreciative.

## 2023-04-04 NOTE — Telephone Encounter (Signed)
 Called [facility], MRI is open to the public. Fax order to [phone].

## 2023-04-05 ENCOUNTER — Telehealth: Payer: Self-pay | Admitting: Family Medicine

## 2023-04-05 ENCOUNTER — Encounter: Payer: Self-pay | Admitting: Family Medicine

## 2023-04-05 DIAGNOSIS — M25512 Pain in left shoulder: Secondary | ICD-10-CM

## 2023-04-05 NOTE — Progress Notes (Signed)
 MRI arthrogram of the shoulder does show a labrum tear that likely will require surgery.  I am worried about the cervical spine as well and that will be done shortly.  I am going to refer you to orthopedic surgery.  You should hear soon from Dr.Bokshan's office.

## 2023-04-05 NOTE — Telephone Encounter (Signed)
 Referred ortho

## 2023-04-06 ENCOUNTER — Ambulatory Visit: Payer: BC Managed Care – PPO

## 2023-04-06 DIAGNOSIS — M79603 Pain in arm, unspecified: Secondary | ICD-10-CM | POA: Diagnosis not present

## 2023-04-06 DIAGNOSIS — M542 Cervicalgia: Secondary | ICD-10-CM

## 2023-04-06 DIAGNOSIS — R200 Anesthesia of skin: Secondary | ICD-10-CM | POA: Diagnosis not present

## 2023-04-06 DIAGNOSIS — M50222 Other cervical disc displacement at C5-C6 level: Secondary | ICD-10-CM | POA: Diagnosis not present

## 2023-04-06 DIAGNOSIS — M4802 Spinal stenosis, cervical region: Secondary | ICD-10-CM

## 2023-04-15 ENCOUNTER — Other Ambulatory Visit: Payer: Self-pay | Admitting: Family Medicine

## 2023-04-15 ENCOUNTER — Other Ambulatory Visit (HOSPITAL_BASED_OUTPATIENT_CLINIC_OR_DEPARTMENT_OTHER): Payer: Self-pay

## 2023-04-15 ENCOUNTER — Ambulatory Visit (HOSPITAL_BASED_OUTPATIENT_CLINIC_OR_DEPARTMENT_OTHER): Payer: Self-pay | Admitting: Orthopaedic Surgery

## 2023-04-15 ENCOUNTER — Ambulatory Visit (HOSPITAL_BASED_OUTPATIENT_CLINIC_OR_DEPARTMENT_OTHER): Admitting: Orthopaedic Surgery

## 2023-04-15 DIAGNOSIS — M25312 Other instability, left shoulder: Secondary | ICD-10-CM

## 2023-04-15 DIAGNOSIS — M25512 Pain in left shoulder: Secondary | ICD-10-CM

## 2023-04-15 MED ORDER — ACETAMINOPHEN 500 MG PO TABS
500.0000 mg | ORAL_TABLET | Freq: Four times a day (QID) | ORAL | 0 refills | Status: AC | PRN
  Filled 2023-04-15: qty 30, 8d supply, fill #0

## 2023-04-15 MED ORDER — OXYCODONE HCL 5 MG PO TABS
5.0000 mg | ORAL_TABLET | ORAL | 0 refills | Status: DC | PRN
  Filled 2023-04-15: qty 20, 4d supply, fill #0

## 2023-04-15 MED ORDER — IBUPROFEN 800 MG PO TABS
800.0000 mg | ORAL_TABLET | Freq: Three times a day (TID) | ORAL | 0 refills | Status: AC | PRN
  Filled 2023-04-15: qty 30, 10d supply, fill #0

## 2023-04-15 MED ORDER — ASPIRIN 325 MG PO TBEC
325.0000 mg | DELAYED_RELEASE_TABLET | Freq: Every day | ORAL | 0 refills | Status: AC
  Filled 2023-04-15: qty 14, 14d supply, fill #0

## 2023-04-15 NOTE — Telephone Encounter (Signed)
 MRI read 04/15/23

## 2023-04-15 NOTE — Telephone Encounter (Signed)
 Can we reach out to radiology about getting this scan read please?  Thank you!

## 2023-04-15 NOTE — Telephone Encounter (Signed)
 Last OV 03/19/23 Next OV not scheduled  Last refill 03/19/23 Qty #30/0

## 2023-04-15 NOTE — Progress Notes (Signed)
 Chief Complaint: Left shoulder pain     History of Present Illness:    Seth Shields is a 33 y.o. male presents today with ongoing left shoulder pain and instability.  He has a history of 2 subluxation incidents on the right with a subsequent labral tear which has been treated conservatively in [place].  He states that he dislocated his shoulder on February 9 on the left side during MMA and felt a very large clunk as it reduced back.  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  He has been trialing Mobic as well as anti-inflammatories without any relief.  This has been very difficult and he has been in a sling and swath since this time.  He is having a very hard time getting back to work or normal activity.  He is here today as a referral from Dr. Denyse Amass for MRI discussion.    PMH/PSH/Family History/Social History/Meds/Allergies:   No past medical history on file. No past surgical history on file. Social History   Socioeconomic History   Marital status: Married    Spouse name: Not on file   Number of children: Not on file   Years of education: Not on file   Highest education level: Not on file  Occupational History   Not on file  Tobacco Use   Smoking status: Former    Current packs/day: 0.00    Average packs/day: 0.5 packs/day for 0.7 years (0.4 ttl pk-yrs)    Types: Cigarettes    Start date: 06/30/2017    Quit date: 03/31/2018    Years since quitting: 5.0   Smokeless tobacco: Never  Vaping Use   Vaping status: Never Used  Substance and Sexual Activity   Alcohol use: Yes    Comment: 6/wk Beer and wine   Drug use: Never   Sexual activity: Yes  Other Topics Concern   Not on file  Social History Narrative   Not on file   Social Drivers of Health   Financial Resource Strain: Not on file  Food Insecurity: Not on file  Transportation Needs: Not on file  Physical Activity: Not on file  Stress: Not on file  Social Connections: Not on file   Family History   Problem Relation Age of Onset   Depression Sister        Manic Depression   Heart disease Brother    Liver cancer Paternal Grandmother        ?lung cancer   Liver cancer Paternal Grandfather    No Known Allergies Current Outpatient Medications  Medication Sig Dispense Refill   acetaminophen (TYLENOL) 500 MG tablet Take 1 tablet (500 mg total) by mouth every 6 (six) hours as needed for up to 14 days. 30 tablet 0   aspirin EC 325 MG tablet Take 1 tablet (325 mg total) by mouth daily for 14 days. 14 tablet 0   ibuprofen (ADVIL) 800 MG tablet Take 1 tablet (800 mg total) by mouth every 8 (eight) hours as needed for up to 14 days. 30 tablet 0   oxyCODONE (ROXICODONE) 5 MG immediate release tablet Take 1 tablet (5 mg total) by mouth every 4 (four) hours as needed for severe pain (pain score 7-10) or breakthrough pain. 20 tablet 0   Amphetamine ER (ADZENYS XR-ODT) 18.8 MG TBED Take by mouth daily.     Biotin 1 MG CAPS Take by mouth.     gabapentin (NEURONTIN) 300 MG capsule Take 1 capsule (300 mg total)  by mouth 3 (three) times daily as needed. 90 capsule 3   Glucosamine-Chondroitin (GLUCOSAMINE CHONDR COMPLEX PO) Take 1 tablet by mouth daily in the afternoon.     meloxicam (MOBIC) 15 MG tablet TAKE 1 TABLET(15 MG) BY MOUTH DAILY 30 tablet 0   Multiple Vitamin (MULTIVITAMIN) tablet Take 1 tablet by mouth daily.     tiZANidine (ZANAFLEX) 4 MG tablet TAKE 1 TABLET(4 MG) BY MOUTH EVERY 6 HOURS AS NEEDED FOR MUSCLE SPASMS 30 tablet 1   No current facility-administered medications for this visit.   No results found.  Review of Systems:   A ROS was performed including pertinent positives and negatives as documented in the HPI.  Physical Exam :   Constitutional: NAD and appears stated age Neurological: Alert and oriented Psych: Appropriate affect and cooperative There were no vitals taken for this visit.   Comprehensive Musculoskeletal Exam:    Musculoskeletal Exam    Inspection Right Left   Skin No atrophy or winging No atrophy or winging  Palpation    Tenderness None Glenohumeral  Range of Motion    Flexion (passive) 170 170  Flexion (active) 170 170  Abduction 170 170  ER at the side 70 70  Can reach behind back to T12 T12  Strength     Full Full  Special Tests    Pseudoparalytic No No  Neurologic    Fires PIN, radial, median, ulnar, musculocutaneous, axillary, suprascapular, long thoracic, and spinal accessory innervated muscles. No abnormal sensibility  Vascular/Lymphatic    Radial Pulse 2+ 2+  Cervical Exam    Patient has symmetric cervical range of motion with negative Spurling's test.  Special Test: 2+ anterior apprehension with positive relocation maneuver      Imaging:   Xray (3 views left shoulder): Normal  MRI (left shoulder): Anterior inferior as well as posterior inferior labral tear   I personally reviewed and interpreted the radiographs.   Assessment and Plan:   33 y.o. male right dominant male with a left shoulder labral tear in the setting of instability.  At today's visit I did discuss that ultimately I do believe he would be a candidate for labral repair as he has quite high demand and at this time his had multiple instances of instability in the shoulder.  He is hopeful to get back to a high level activity including mixed martial arts.  He is very adamantly hoping to get back into full activity and sports and is still dealing with repercussions from previous right shoulder instability as well.  Given this I did discuss risks and limitations.  After discussion he has elected for left shoulder arthroscopy with labral repair  -Plan for left shoulder arthroscopy with labral repair   After a lengthy discussion of treatment options, including risks, benefits, alternatives, complications of surgical and nonsurgical conservative options, the patient elected surgical repair.   The patient  is aware of the material risks  and complications  including, but not limited to injury to adjacent structures, neurovascular injury, infection, numbness, bleeding, implant failure, thermal burns, stiffness, persistent pain, failure to heal, disease transmission from allograft, need for further surgery, dislocation, anesthetic risks, blood clots, risks of death,and others. The probabilities of surgical success and failure discussed with patient given their particular co-morbidities.The time and nature of expected rehabilitation and recovery was discussed.The patient's questions were all answered preoperatively.  No barriers to understanding were noted. I explained the natural history of the disease process and Rx rationale.  I explained to  the patient what I considered to be reasonable expectations given their personal situation.  The final treatment plan was arrived at through a shared patient decision making process model.   I personally saw and evaluated the patient, and participated in the management and treatment plan.  Huel Cote, MD Attending Physician, Orthopedic Surgery  This document was dictated using Dragon voice recognition software. A reasonable attempt at proof reading has been made to minimize errors.

## 2023-04-15 NOTE — H&P (View-Only) (Signed)
 Chief Complaint: Left shoulder pain     History of Present Illness:    Seth Shields is a 33 y.o. male presents today with ongoing left shoulder pain and instability.  He has a history of 2 subluxation incidents on the right with a subsequent labral tear which has been treated conservatively in [place].  He states that he dislocated his shoulder on February 9 on the left side during MMA and felt a very large clunk as it reduced back.  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  He has been trialing Mobic as well as anti-inflammatories without any relief.  This has been very difficult and he has been in a sling and swath since this time.  He is having a very hard time getting back to work or normal activity.  He is here today as a referral from Dr. Denyse Amass for MRI discussion.    PMH/PSH/Family History/Social History/Meds/Allergies:   No past medical history on file. No past surgical history on file. Social History   Socioeconomic History   Marital status: Married    Spouse name: Not on file   Number of children: Not on file   Years of education: Not on file   Highest education level: Not on file  Occupational History   Not on file  Tobacco Use   Smoking status: Former    Current packs/day: 0.00    Average packs/day: 0.5 packs/day for 0.7 years (0.4 ttl pk-yrs)    Types: Cigarettes    Start date: 06/30/2017    Quit date: 03/31/2018    Years since quitting: 5.0   Smokeless tobacco: Never  Vaping Use   Vaping status: Never Used  Substance and Sexual Activity   Alcohol use: Yes    Comment: 6/wk Beer and wine   Drug use: Never   Sexual activity: Yes  Other Topics Concern   Not on file  Social History Narrative   Not on file   Social Drivers of Health   Financial Resource Strain: Not on file  Food Insecurity: Not on file  Transportation Needs: Not on file  Physical Activity: Not on file  Stress: Not on file  Social Connections: Not on file   Family History   Problem Relation Age of Onset   Depression Sister        Manic Depression   Heart disease Brother    Liver cancer Paternal Grandmother        ?lung cancer   Liver cancer Paternal Grandfather    No Known Allergies Current Outpatient Medications  Medication Sig Dispense Refill   acetaminophen (TYLENOL) 500 MG tablet Take 1 tablet (500 mg total) by mouth every 6 (six) hours as needed for up to 14 days. 30 tablet 0   aspirin EC 325 MG tablet Take 1 tablet (325 mg total) by mouth daily for 14 days. 14 tablet 0   ibuprofen (ADVIL) 800 MG tablet Take 1 tablet (800 mg total) by mouth every 8 (eight) hours as needed for up to 14 days. 30 tablet 0   oxyCODONE (ROXICODONE) 5 MG immediate release tablet Take 1 tablet (5 mg total) by mouth every 4 (four) hours as needed for severe pain (pain score 7-10) or breakthrough pain. 20 tablet 0   Amphetamine ER (ADZENYS XR-ODT) 18.8 MG TBED Take by mouth daily.     Biotin 1 MG CAPS Take by mouth.     gabapentin (NEURONTIN) 300 MG capsule Take 1 capsule (300 mg total)  by mouth 3 (three) times daily as needed. 90 capsule 3   Glucosamine-Chondroitin (GLUCOSAMINE CHONDR COMPLEX PO) Take 1 tablet by mouth daily in the afternoon.     meloxicam (MOBIC) 15 MG tablet TAKE 1 TABLET(15 MG) BY MOUTH DAILY 30 tablet 0   Multiple Vitamin (MULTIVITAMIN) tablet Take 1 tablet by mouth daily.     tiZANidine (ZANAFLEX) 4 MG tablet TAKE 1 TABLET(4 MG) BY MOUTH EVERY 6 HOURS AS NEEDED FOR MUSCLE SPASMS 30 tablet 1   No current facility-administered medications for this visit.   No results found.  Review of Systems:   A ROS was performed including pertinent positives and negatives as documented in the HPI.  Physical Exam :   Constitutional: NAD and appears stated age Neurological: Alert and oriented Psych: Appropriate affect and cooperative There were no vitals taken for this visit.   Comprehensive Musculoskeletal Exam:    Musculoskeletal Exam    Inspection Right Left   Skin No atrophy or winging No atrophy or winging  Palpation    Tenderness None Glenohumeral  Range of Motion    Flexion (passive) 170 170  Flexion (active) 170 170  Abduction 170 170  ER at the side 70 70  Can reach behind back to T12 T12  Strength     Full Full  Special Tests    Pseudoparalytic No No  Neurologic    Fires PIN, radial, median, ulnar, musculocutaneous, axillary, suprascapular, long thoracic, and spinal accessory innervated muscles. No abnormal sensibility  Vascular/Lymphatic    Radial Pulse 2+ 2+  Cervical Exam    Patient has symmetric cervical range of motion with negative Spurling's test.  Special Test: 2+ anterior apprehension with positive relocation maneuver      Imaging:   Xray (3 views left shoulder): Normal  MRI (left shoulder): Anterior inferior as well as posterior inferior labral tear   I personally reviewed and interpreted the radiographs.   Assessment and Plan:   33 y.o. male right dominant male with a left shoulder labral tear in the setting of instability.  At today's visit I did discuss that ultimately I do believe he would be a candidate for labral repair as he has quite high demand and at this time his had multiple instances of instability in the shoulder.  He is hopeful to get back to a high level activity including mixed martial arts.  He is very adamantly hoping to get back into full activity and sports and is still dealing with repercussions from previous right shoulder instability as well.  Given this I did discuss risks and limitations.  After discussion he has elected for left shoulder arthroscopy with labral repair  -Plan for left shoulder arthroscopy with labral repair   After a lengthy discussion of treatment options, including risks, benefits, alternatives, complications of surgical and nonsurgical conservative options, the patient elected surgical repair.   The patient  is aware of the material risks  and complications  including, but not limited to injury to adjacent structures, neurovascular injury, infection, numbness, bleeding, implant failure, thermal burns, stiffness, persistent pain, failure to heal, disease transmission from allograft, need for further surgery, dislocation, anesthetic risks, blood clots, risks of death,and others. The probabilities of surgical success and failure discussed with patient given their particular co-morbidities.The time and nature of expected rehabilitation and recovery was discussed.The patient's questions were all answered preoperatively.  No barriers to understanding were noted. I explained the natural history of the disease process and Rx rationale.  I explained to  the patient what I considered to be reasonable expectations given their personal situation.  The final treatment plan was arrived at through a shared patient decision making process model.   I personally saw and evaluated the patient, and participated in the management and treatment plan.  Huel Cote, MD Attending Physician, Orthopedic Surgery  This document was dictated using Dragon voice recognition software. A reasonable attempt at proof reading has been made to minimize errors.

## 2023-04-16 ENCOUNTER — Other Ambulatory Visit: Payer: BC Managed Care – PPO

## 2023-04-16 ENCOUNTER — Encounter: Payer: Self-pay | Admitting: Family Medicine

## 2023-04-16 ENCOUNTER — Telehealth: Payer: Self-pay | Admitting: Family Medicine

## 2023-04-16 DIAGNOSIS — M5010 Cervical disc disorder with radiculopathy, unspecified cervical region: Secondary | ICD-10-CM

## 2023-04-16 DIAGNOSIS — M542 Cervicalgia: Secondary | ICD-10-CM

## 2023-04-16 NOTE — Progress Notes (Signed)
 Cervical spine MRI shows narrowing with the spinal cord itself being pinched a bit.  A injection in the neck could help but I am going to refer you to a neurosurgeon first.  You should hear soon from the surgeon.

## 2023-04-16 NOTE — Telephone Encounter (Signed)
 Referral placed today.

## 2023-04-29 ENCOUNTER — Other Ambulatory Visit: Payer: Self-pay

## 2023-04-29 ENCOUNTER — Encounter (HOSPITAL_BASED_OUTPATIENT_CLINIC_OR_DEPARTMENT_OTHER): Payer: Self-pay | Admitting: Orthopaedic Surgery

## 2023-04-30 DIAGNOSIS — M4802 Spinal stenosis, cervical region: Secondary | ICD-10-CM | POA: Diagnosis not present

## 2023-04-30 DIAGNOSIS — M542 Cervicalgia: Secondary | ICD-10-CM | POA: Diagnosis not present

## 2023-04-30 DIAGNOSIS — M25512 Pain in left shoulder: Secondary | ICD-10-CM | POA: Diagnosis not present

## 2023-04-30 NOTE — Progress Notes (Signed)

## 2023-05-03 ENCOUNTER — Encounter (HOSPITAL_BASED_OUTPATIENT_CLINIC_OR_DEPARTMENT_OTHER): Payer: Self-pay | Admitting: Orthopaedic Surgery

## 2023-05-07 ENCOUNTER — Encounter (HOSPITAL_BASED_OUTPATIENT_CLINIC_OR_DEPARTMENT_OTHER): Payer: Self-pay | Admitting: Orthopaedic Surgery

## 2023-05-07 ENCOUNTER — Other Ambulatory Visit: Payer: Self-pay

## 2023-05-07 ENCOUNTER — Ambulatory Visit (HOSPITAL_BASED_OUTPATIENT_CLINIC_OR_DEPARTMENT_OTHER): Admitting: Anesthesiology

## 2023-05-07 ENCOUNTER — Encounter (HOSPITAL_BASED_OUTPATIENT_CLINIC_OR_DEPARTMENT_OTHER)
Admission: RE | Disposition: A | Discharge status: Home / Self Care | Payer: Self-pay | Source: Home / Self Care | Attending: Orthopaedic Surgery

## 2023-05-07 ENCOUNTER — Ambulatory Visit (HOSPITAL_BASED_OUTPATIENT_CLINIC_OR_DEPARTMENT_OTHER)
Admission: RE | Admit: 2023-05-07 | Discharge: 2023-05-07 | Disposition: A | Discharge status: Home / Self Care | Attending: Orthopaedic Surgery | Admitting: Orthopaedic Surgery

## 2023-05-07 DIAGNOSIS — Z7982 Long term (current) use of aspirin: Secondary | ICD-10-CM | POA: Insufficient documentation

## 2023-05-07 DIAGNOSIS — Z791 Long term (current) use of non-steroidal anti-inflammatories (NSAID): Secondary | ICD-10-CM | POA: Insufficient documentation

## 2023-05-07 DIAGNOSIS — Z87891 Personal history of nicotine dependence: Secondary | ICD-10-CM | POA: Diagnosis not present

## 2023-05-07 DIAGNOSIS — Z79899 Other long term (current) drug therapy: Secondary | ICD-10-CM | POA: Insufficient documentation

## 2023-05-07 DIAGNOSIS — S43492A Other sprain of left shoulder joint, initial encounter: Secondary | ICD-10-CM | POA: Diagnosis not present

## 2023-05-07 DIAGNOSIS — X58XXXA Exposure to other specified factors, initial encounter: Secondary | ICD-10-CM | POA: Insufficient documentation

## 2023-05-07 DIAGNOSIS — S43432A Superior glenoid labrum lesion of left shoulder, initial encounter: Secondary | ICD-10-CM | POA: Diagnosis not present

## 2023-05-07 DIAGNOSIS — M25312 Other instability, left shoulder: Secondary | ICD-10-CM | POA: Diagnosis not present

## 2023-05-07 DIAGNOSIS — G8918 Other acute postprocedural pain: Secondary | ICD-10-CM | POA: Diagnosis not present

## 2023-05-07 HISTORY — PX: SHOULDER ARTHROSCOPY WITH LABRAL REPAIR: SHX5691

## 2023-05-07 HISTORY — DX: Other complications of anesthesia, initial encounter: T88.59XA

## 2023-05-07 HISTORY — DX: Benign prostatic hyperplasia without lower urinary tract symptoms: N40.0

## 2023-05-07 HISTORY — DX: Unilateral inguinal hernia, without obstruction or gangrene, not specified as recurrent: K40.90

## 2023-05-07 HISTORY — DX: Superior glenoid labrum lesion of unspecified shoulder, initial encounter: S43.439A

## 2023-05-07 SURGERY — ARTHROSCOPY, SHOULDER, WITH GLENOID LABRUM REPAIR
Anesthesia: Regional | Site: Shoulder | Laterality: Left

## 2023-05-07 MED ORDER — FENTANYL CITRATE (PF) 100 MCG/2ML IJ SOLN
INTRAMUSCULAR | Status: AC
Start: 2023-05-07 — End: ?
  Filled 2023-05-07: qty 2

## 2023-05-07 MED ORDER — ACETAMINOPHEN 500 MG PO TABS
ORAL_TABLET | ORAL | Status: AC
  Filled 2023-05-07: qty 2

## 2023-05-07 MED ORDER — LACTATED RINGERS IV SOLN: INTRAVENOUS | Status: DC

## 2023-05-07 MED ORDER — PROPOFOL 10 MG/ML IV BOLUS
INTRAVENOUS | Status: DC | PRN
  Administered 2023-05-07: 200 mg via INTRAVENOUS

## 2023-05-07 MED ORDER — SODIUM CHLORIDE 0.9 % IR SOLN
Status: DC | PRN
  Administered 2023-05-07 (×3): 3000 mL

## 2023-05-07 MED ORDER — OXYCODONE HCL 5 MG PO TABS
5.0000 mg | ORAL_TABLET | Freq: Once | ORAL | Status: AC | PRN
  Administered 2023-05-07: 5 mg via ORAL

## 2023-05-07 MED ORDER — FENTANYL CITRATE (PF) 100 MCG/2ML IJ SOLN: 25.0000 ug | INTRAMUSCULAR | Status: DC | PRN

## 2023-05-07 MED ORDER — BUPIVACAINE HCL (PF) 0.5 % IJ SOLN
INTRAMUSCULAR | Status: DC | PRN
  Administered 2023-05-07: 10 mL via PERINEURAL

## 2023-05-07 MED ORDER — OXYCODONE HCL 5 MG PO TABS
ORAL_TABLET | ORAL | Status: AC
  Filled 2023-05-07: qty 1

## 2023-05-07 MED ORDER — TRANEXAMIC ACID-NACL 1000-0.7 MG/100ML-% IV SOLN
INTRAVENOUS | Status: AC
  Filled 2023-05-07: qty 100

## 2023-05-07 MED ORDER — AMISULPRIDE (ANTIEMETIC) 5 MG/2ML IV SOLN: 10.0000 mg | Freq: Once | INTRAVENOUS | Status: DC | PRN

## 2023-05-07 MED ORDER — KETOROLAC TROMETHAMINE 30 MG/ML IJ SOLN: 30.0000 mg | Freq: Once | INTRAMUSCULAR | Status: DC | PRN

## 2023-05-07 MED ORDER — GABAPENTIN 300 MG PO CAPS
300.0000 mg | ORAL_CAPSULE | Freq: Once | ORAL | Status: AC
  Administered 2023-05-07: 300 mg via ORAL

## 2023-05-07 MED ORDER — FENTANYL CITRATE (PF) 100 MCG/2ML IJ SOLN
100.0000 ug | Freq: Once | INTRAMUSCULAR | Status: AC
  Administered 2023-05-07: 50 ug via INTRAVENOUS

## 2023-05-07 MED ORDER — GABAPENTIN 300 MG PO CAPS
ORAL_CAPSULE | ORAL | Status: AC
  Filled 2023-05-07: qty 1

## 2023-05-07 MED ORDER — FENTANYL CITRATE (PF) 100 MCG/2ML IJ SOLN
INTRAMUSCULAR | Status: AC
  Filled 2023-05-07: qty 2

## 2023-05-07 MED ORDER — ACETAMINOPHEN 500 MG PO TABS
1000.0000 mg | ORAL_TABLET | Freq: Once | ORAL | Status: AC
Start: 2023-05-07 — End: 2023-05-07
  Administered 2023-05-07: 1000 mg via ORAL

## 2023-05-07 MED ORDER — OXYCODONE HCL 5 MG/5ML PO SOLN: 5.0000 mg | Freq: Once | ORAL | Status: AC | PRN

## 2023-05-07 MED ORDER — LIDOCAINE HCL (CARDIAC) PF 100 MG/5ML IV SOSY
PREFILLED_SYRINGE | INTRAVENOUS | Status: DC | PRN
  Administered 2023-05-07: 80 mg via INTRAVENOUS

## 2023-05-07 MED ORDER — CEFAZOLIN SODIUM-DEXTROSE 2-4 GM/100ML-% IV SOLN
INTRAVENOUS | Status: AC
  Filled 2023-05-07: qty 100

## 2023-05-07 MED ORDER — FENTANYL CITRATE (PF) 100 MCG/2ML IJ SOLN
INTRAMUSCULAR | Status: DC | PRN
  Administered 2023-05-07: 25 ug via INTRAVENOUS
  Administered 2023-05-07: 50 ug via INTRAVENOUS
  Administered 2023-05-07: 25 ug via INTRAVENOUS

## 2023-05-07 MED ORDER — SUGAMMADEX SODIUM 200 MG/2ML IV SOLN
INTRAVENOUS | Status: DC | PRN
  Administered 2023-05-07: 200 mg via INTRAVENOUS

## 2023-05-07 MED ORDER — MIDAZOLAM HCL 2 MG/2ML IJ SOLN
2.0000 mg | Freq: Once | INTRAMUSCULAR | Status: AC
  Administered 2023-05-07: 2 mg via INTRAVENOUS

## 2023-05-07 MED ORDER — TRANEXAMIC ACID-NACL 1000-0.7 MG/100ML-% IV SOLN
1000.0000 mg | INTRAVENOUS | Status: AC
  Administered 2023-05-07: 1000 mg via INTRAVENOUS

## 2023-05-07 MED ORDER — CEFAZOLIN SODIUM-DEXTROSE 2-4 GM/100ML-% IV SOLN
2.0000 g | INTRAVENOUS | Status: AC
  Administered 2023-05-07: 2 g via INTRAVENOUS

## 2023-05-07 MED ORDER — ROCURONIUM BROMIDE 100 MG/10ML IV SOLN
INTRAVENOUS | Status: DC | PRN
  Administered 2023-05-07: 50 mg via INTRAVENOUS

## 2023-05-07 MED ORDER — BUPIVACAINE LIPOSOME 1.3 % IJ SUSP
INTRAMUSCULAR | Status: DC | PRN
  Administered 2023-05-07: 10 mL via PERINEURAL

## 2023-05-07 MED ORDER — MIDAZOLAM HCL 2 MG/2ML IJ SOLN
INTRAMUSCULAR | Status: AC
  Filled 2023-05-07: qty 2

## 2023-05-07 SURGICAL SUPPLY — 54 items
ANCHOR SUT 1.8 FIBERTAK SB KL (Anchor) IMPLANT
BLADE EXCALIBUR 4.0X13 (MISCELLANEOUS) IMPLANT
BLADE SHAVER TORPEDO 4X13 (MISCELLANEOUS) ×1 IMPLANT
BUR SURG 4D 13L RD FLUTE (BUR) IMPLANT
BURR OVAL 8 FLU 4.0X13 (MISCELLANEOUS) IMPLANT
BURR SURG 4D 13L RD FLUTE (BUR) IMPLANT
CANNULA 5.75X71 LONG (CANNULA) IMPLANT
CANNULA 8.25X9 (CANNULA) IMPLANT
CANNULA PASSPORT 5 (CANNULA) IMPLANT
CANNULA PASSPORT BUTTON 10-40 (CANNULA) IMPLANT
CANNULA TWIST IN 8.25X7CM (CANNULA) IMPLANT
CHLORAPREP W/TINT 26 (MISCELLANEOUS) ×1 IMPLANT
COOLER ICEMAN CLASSIC (MISCELLANEOUS) ×1 IMPLANT
DRAPE IMP U-DRAPE 54X76 (DRAPES) ×1 IMPLANT
DRAPE INCISE IOBAN 66X45 STRL (DRAPES) ×1 IMPLANT
DRAPE SHOULDER BEACH CHAIR (DRAPES) ×1 IMPLANT
DRAPE U-SHAPE 47X51 STRL (DRAPES) ×2 IMPLANT
DRSG TEGADERM 4X4.75 (GAUZE/BANDAGES/DRESSINGS) IMPLANT
DW OUTFLOW CASSETTE/TUBE SET (MISCELLANEOUS) ×1 IMPLANT
GAUZE PAD ABD 8X10 STRL (GAUZE/BANDAGES/DRESSINGS) ×1 IMPLANT
GAUZE SPONGE 4X4 12PLY STRL (GAUZE/BANDAGES/DRESSINGS) ×1 IMPLANT
GAUZE XEROFORM 1X8 LF (GAUZE/BANDAGES/DRESSINGS) ×1 IMPLANT
GLOVE BIO SURGEON STRL SZ 6 (GLOVE) ×2 IMPLANT
GLOVE BIO SURGEON STRL SZ7.5 (GLOVE) ×1 IMPLANT
GLOVE BIOGEL PI IND STRL 6.5 (GLOVE) ×1 IMPLANT
GLOVE BIOGEL PI IND STRL 8 (GLOVE) ×1 IMPLANT
GLOVE ECLIPSE 8.0 STRL XLNG CF (GLOVE) ×1 IMPLANT
GOWN STRL REUS W/ TWL LRG LVL3 (GOWN DISPOSABLE) ×2 IMPLANT
GOWN STRL REUS W/TWL XL LVL3 (GOWN DISPOSABLE) ×1 IMPLANT
KIT CVD SPEAR FBRTK 1.8 DRILL (KITS) IMPLANT
KIT PERC INSERT 3.0 KNTLS (KITS) IMPLANT
KIT PUSHLOCK 2.9 HIP (KITS) IMPLANT
KIT STR SPEAR 1.8 FBRTK DISP (KITS) IMPLANT
LASSO 90 CVE QUICKPAS (DISPOSABLE) IMPLANT
LASSO CRESCENT QUICKPASS (SUTURE) IMPLANT
MANIFOLD NEPTUNE II (INSTRUMENTS) ×1 IMPLANT
NDL SAFETY ECLIPSE 18X1.5 (NEEDLE) ×1 IMPLANT
PACK ARTHROSCOPY DSU (CUSTOM PROCEDURE TRAY) ×1 IMPLANT
PACK BASIN DAY SURGERY FS (CUSTOM PROCEDURE TRAY) ×1 IMPLANT
PAD COLD SHLDR WRAP-ON (PAD) ×1 IMPLANT
SHEET MEDIUM DRAPE 40X70 STRL (DRAPES) ×1 IMPLANT
SLEEVE ARM SUSPENSION SYSTEM (MISCELLANEOUS) ×1 IMPLANT
SLEEVE SCD COMPRESS KNEE MED (STOCKING) ×1 IMPLANT
SLING S3 LATERAL DISP (MISCELLANEOUS) ×1 IMPLANT
SUT ETHILON 3 0 PS 1 (SUTURE) ×1 IMPLANT
SUT FIBERWIRE #2 38 T-5 BLUE (SUTURE) IMPLANT
SUTURE FIBERWR #2 38 T-5 BLUE (SUTURE) IMPLANT
SUTURE TAPE TIGERLINK 1.3MM BL (SUTURE) IMPLANT
SUTURETAPE TIGERLINK 1.3MM BL (SUTURE) IMPLANT
SYR 5ML LL (SYRINGE) ×1 IMPLANT
TOWEL GREEN STERILE FF (TOWEL DISPOSABLE) ×2 IMPLANT
TUBE CONNECTING 20X1/4 (TUBING) ×1 IMPLANT
TUBING ARTHROSCOPY IRRIG 16FT (MISCELLANEOUS) ×1 IMPLANT
WAND ABLATOR APOLLO I90 (BUR) ×1 IMPLANT

## 2023-05-07 NOTE — Op Note (Signed)
 Date of Surgery: 05/07/2023  INDICATIONS: Seth Shields is a 33 y.o.-year-old male with left shoulder labral tear.  The risk and benefits of the procedure were discussed in detail and documented in the pre-operative evaluation.   PREOPERATIVE DIAGNOSIS: 1. Left shoulder inferior labral tear  POSTOPERATIVE DIAGNOSIS: Same.  PROCEDURE: 1. Left shoulder inferior labral repair  SURGEON: Benancio Deeds MD  ASSISTANT: Ardeen Fillers, ATC  ANESTHESIA:  general plus interscalene nerve block  IV FLUIDS AND URINE: See anesthesia record.  ANTIBIOTICS: Ancef  ESTIMATED BLOOD LOSS: 5 mL.  IMPLANTS:  Implant Name Type Inv. Item Serial No. Manufacturer Lot No. LRB No. Used Action  ANCHOR SUT 1.8 FIBERTAK SB KL - [id number] Anchor ANCHOR SUT 1.8 FIBERTAK SB KL  [employer] [id number] Left 1 Implanted  ANCHOR SUT 1.8 FIBERTAK SB KL - [id number] Anchor ANCHOR SUT 1.8 FIBERTAK SB KL  [employer] [id number] Left 1 Implanted  ANCHOR SUT 1.8 FIBERTAK SB KL - [id number] Anchor ANCHOR SUT 1.8 FIBERTAK SB KL  [employer] [id number] Left 1 Implanted  ANCHOR SUT 1.8 FIBERTAK SB KL - [id number] Anchor ANCHOR SUT 1.8 FIBERTAK SB KL  [employer] [id number] Left 1 Implanted    DRAINS: None  CULTURES: None  COMPLICATIONS: none  DESCRIPTION OF PROCEDURE:  Examination under anesthesia revealed forward elevation of 165 degrees.  In abduction, there was 90 degrees of external rotation and 70 degrees of internal rotation.  With the arm at the side, there was 70 degrees of external rotation.  There is a 2+ anterior load shift and a 2+ posterior load shift with palpable click as the humerus moved over the glenoid.  Arthroscopic findings demonstrated:  Glenoid cartilage: Normal Humeral head: Normal Labrum:  labral tear from 8 o'clock to 3 o'clock Biceps insertion: Intact Biceps tendon: Intact Subscapularis insertion: Normal Rotator cuff: Normal  The patient was identified in the preoperative holding area.  The  correct site was marked according to universal protocol.  Anesthesia performed an interscalene nerve block.  Ancef was given 1 hour prior to skin incision.  The patient was subsequently taken back to the operating room.  The patient was prepped and draped and positioned in the lateral position.  All bony prominences were padded.  Final timeout was performed.  Standard posterior, anterior and anterosuperolateral portals were utilized. The posterior portal was created with an 11-blade and the arthroscope introduced into the glenohumeral joint.  A full diagnostic arthroscopy was performed as described above.  A low anterior portal just above the rolled border of the subscapularis was identified with a spinal needle, and then instrumenting cannula was placed.  An anterosuperolateral viewing portal was localized with a spinal needle just posterior to the biceps tendon, and the arthroscope was transferred to this portal.  A posterior portal was also placed for instrumentation.  First, I directed my attention to preparation of the glenoid, labrum and capsule for repair.  The elevator was used to elevate off the injured labrum from the glenoid rim.  A shaver was subsequently introduced and used on forward in order to create bleeding bony bed for the labrum to heal back to.  Debridement was performed of the posterior and inferior labrum with combination of electrocautery and shaver.  Next, I sequentially repaired the capsule and labrum from the 8:00 position to the 3:00 position with a total of 4 anchors.  These were all suture knotless anchors as noted above.  Anchors were placed at the 7:30, 3:30, 5:00, 6:00 positions.  At  each location, a pilot hole was drilled, the anchor inserted and deployed.  Then, one limb of suture was shuttled around the labrum and capsule using a suture lasso, taking care to provide both medial to lateral and inferior to superior shift of the tissues.  This was subsequently fed into the  knotless mechanism and tensioned.  This was done sequentially for all additional anchors.  Once completed, the labrum was restored to an anatomic position, and tension was restored to both bands of the IGHL.  The inferior capsular volume was normalized and the humeral head was centered on the glenoid.   All instruments were removed, fluid was evacuated, and the arthroscopy portals were closed with 3-0 nylon.  A sterile dressing was applied with Xeroform, gauze, ABD and Medipore tape followed by a Iceman device and a sling with an abduction pillow.    The patient awoke from anesthesia without difficulty and was transferred to PACU in stable condition.      POSTOPERATIVE PLAN: He will follow the labral repair protocol. He will be seen in PT week 1.  Benancio Deeds, MD 3:43 PM

## 2023-05-07 NOTE — Anesthesia Preprocedure Evaluation (Addendum)
 Anesthesia Evaluation  Patient identified by MRN, date of birth, ID band Patient awake    Reviewed: Allergy & Precautions, NPO status , Patient's Chart, lab work & pertinent test results  Airway Mallampati: II  TM Distance: >3 FB Neck ROM: Full    Dental no notable dental hx.    Pulmonary former smoker   Pulmonary exam normal        Cardiovascular negative cardio ROS Normal cardiovascular exam     Neuro/Psych negative neurological ROS  negative psych ROS   GI/Hepatic negative GI ROS, Neg liver ROS,,,  Endo/Other  negative endocrine ROS    Renal/GU negative Renal ROS     Musculoskeletal negative musculoskeletal ROS (+)    Abdominal   Peds  Hematology negative hematology ROS (+)   Anesthesia Other Findings LEFT SHOULDER LABRAL TEAR  Reproductive/Obstetrics                             Anesthesia Physical Anesthesia Plan  ASA: 1  Anesthesia Plan: General and Regional   Post-op Pain Management: Regional block* and Precedex   Induction: Intravenous  PONV Risk Score and Plan: 2 and Ondansetron, Dexamethasone, Midazolam and Treatment may vary due to age or medical condition  Airway Management Planned: Oral ETT  Additional Equipment:   Intra-op Plan:   Post-operative Plan: Extubation in OR  Informed Consent: I have reviewed the patients History and Physical, chart, labs and discussed the procedure including the risks, benefits and alternatives for the proposed anesthesia with the patient or authorized representative who has indicated his/her understanding and acceptance.     Dental advisory given  Plan Discussed with: CRNA  Anesthesia Plan Comments: (Risks of peripheral nerve block explained at length. This includes, but is not limited to, bleeding, infection, reactions to the medications, seizures, damage to surrounding structures, damage to nerves, permanent weakness, numbness,  tingling and pain. All patient questions were answered and patient wishes to proceed with nerve block. )       Anesthesia Quick Evaluation

## 2023-05-07 NOTE — Brief Op Note (Signed)
   Brief Op Note  Date of Surgery: 05/07/2023  Preoperative Diagnosis: LEFT SHOULDER LABRAL TEAR  Postoperative Diagnosis: same  Procedure: Procedure(s): ARTHROSCOPY, SHOULDER, WITH GLENOID LABRUM REPAIR  Implants: Implant Name Type Inv. Item Serial No. Manufacturer Lot No. LRB No. Used Action  ANCHOR SUT 1.8 FIBERTAK SB KL - [id number] Anchor ANCHOR SUT 1.8 FIBERTAK SB KL  [employer] [id number] Left 1 Implanted  ANCHOR SUT 1.8 FIBERTAK SB KL - [id number] Anchor ANCHOR SUT 1.8 FIBERTAK SB KL  [employer] [id number] Left 1 Implanted  ANCHOR SUT 1.8 FIBERTAK SB KL - [id number] Anchor ANCHOR SUT 1.8 FIBERTAK SB KL  [employer] [id number] Left 1 Implanted  ANCHOR SUT 1.8 FIBERTAK SB KL - [id number] Anchor ANCHOR SUT 1.8 [employer] [id number] Left 1 Implanted    Surgeons: Surgeon(s): Huel Cote, MD  Anesthesia: General    Estimated Blood Loss: See anesthesia record  Complications: None  Condition to PACU: Stable  Benancio Deeds, MD 05/07/2023 3:43 PM

## 2023-05-07 NOTE — Transfer of Care (Signed)
 Immediate Anesthesia Transfer of Care Note  Patient: Seth Shields  Procedure(s) Performed: ARTHROSCOPY, SHOULDER, WITH GLENOID LABRUM REPAIR (Left: Shoulder)  Patient Location: PACU  Anesthesia Type:General  Level of Consciousness: awake, alert , and patient cooperative  Airway & Oxygen Therapy: Patient Spontanous Breathing and Patient connected to nasal cannula oxygen  Post-op Assessment: Report given to RN and Post -op Vital signs reviewed and stable  Post vital signs: Reviewed and stable  Last Vitals:  Vitals Value Taken Time  BP 134/92 05/07/23 1551  Temp    Pulse 86 05/07/23 1555  Resp 21 05/07/23 1555  SpO2 93 % 05/07/23 1555  Vitals shown include unfiled device data.  Last Pain:  Vitals:   05/07/23 1045  TempSrc: Temporal  PainSc: 5          Complications: No notable events documented.

## 2023-05-07 NOTE — Interval H&P Note (Signed)
 History and Physical Interval Note:  05/07/2023 10:41 AM  Seth Shields  has presented today for surgery, with the diagnosis of LEFT SHOULDER LABRAL TEAR.  The various methods of treatment have been discussed with the patient and family. After consideration of risks, benefits and other options for treatment, the patient has consented to  Procedure(s): ARTHROSCOPY, SHOULDER, WITH GLENOID LABRUM REPAIR (Left) as a surgical intervention.  The patient's history has been reviewed, patient examined, no change in status, stable for surgery.  I have reviewed the patient's chart and labs.  Questions were answered to the patient's satisfaction.     Huel Cote

## 2023-05-07 NOTE — Anesthesia Procedure Notes (Addendum)
 Procedure Name: Intubation Date/Time: 05/07/2023 2:54 PM  Performed by: Earmon Phoenix, CRNAPre-anesthesia Checklist: Patient identified, Patient being monitored, Timeout performed, Emergency Drugs available and Suction available Patient Re-evaluated:Patient Re-evaluated prior to induction Oxygen Delivery Method: Circle System Utilized Preoxygenation: Pre-oxygenation with 100% oxygen Induction Type: IV induction Ventilation: Mask ventilation without difficulty Laryngoscope Size: Mac and 4 Grade View: Grade I Tube type: Oral Tube size: 7.0 mm Number of attempts: 1 Airway Equipment and Method: stylet Placement Confirmation: ETT inserted through vocal cords under direct vision, positive ETCO2, breath sounds checked- equal and bilateral and CO2 detector Secured at: 24 cm Tube secured with: Tape Dental Injury: Teeth and Oropharynx as per pre-operative assessment

## 2023-05-07 NOTE — Anesthesia Procedure Notes (Signed)
 Anesthesia Regional Block: Interscalene brachial plexus block   Pre-Anesthetic Checklist: , timeout performed,  Correct Patient, Correct Site, Correct Laterality,  Correct Procedure, Correct Position, site marked,  Risks and benefits discussed,  Surgical consent,  Pre-op evaluation,  At surgeon's request and post-op pain management  Laterality: Upper and Left  Prep: chloraprep       Needles:  Injection technique: Single-shot  Needle Type: Stimulator Needle - 40     Needle Length: 4cm  Needle Gauge: 22     Additional Needles:   Procedures:,,,, ultrasound used (permanent image in chart),,    Narrative:  Start time: 05/07/2023 1:38 PM End time: 05/07/2023 1:58 PM Injection made incrementally with aspirations every 5 mL.  Performed by: Personally  Anesthesiologist: Lewie Loron, MD  Additional Notes: BP cuff, SpO2 and EKG monitors applied. Sedation begun. Nerve location verified with ultrasound. Anesthetic injected incrementally, slowly, and after neg aspirations under direct u/s guidance. Good perineural spread. Tolerated well.

## 2023-05-07 NOTE — Discharge Instructions (Addendum)
 Discharge Instructions    Attending Surgeon: Huel Cote, MD Office Phone Number: [phone]   Diagnosis and Procedures:    Surgeries Performed: Left shoulder labral repair  Discharge Plan:    Diet: Resume usual diet. Begin with light or bland foods.  Drink plenty of fluids.  Activity:  Keep sling and dressing in place until your follow up visit in Physical Therapy You are advised to go home directly from the hospital or surgical center. Restrict your activities.  GENERAL INSTRUCTIONS: 1.  Keep your surgical site elevated above your heart for at least 5-7 days or longer to prevent swelling. This will improve your comfort and your overall recovery following surgery.     2. Please call Dr. Serena Croissant office at [phone] with questions Monday-Friday during business hours. If no one answers, please leave a message and someone should get back to the patient within 24 hours. For emergencies please call 911 or proceed to the emergency room.   3. Patient to notify surgical team if experiences any of the following: Bowel/Bladder dysfunction, uncontrolled pain, nerve/muscle weakness, incision with increased drainage or redness, nausea/vomiting and Fever greater than 101.0 F.  Be alert for signs of infection including redness, streaking, odor, fever or chills. Be alert for excessive pain or bleeding and notify your surgeon immediately.  WOUND INSTRUCTIONS:   Leave your dressing/cast/splint in place until your post operative visit.  Keep it clean and dry.  Always keep the incision clean and dry until the staples/sutures are removed. If there is no drainage from the incision you should keep it open to air. If there is drainage from the incision you must keep it covered at all times until the drainage stops  Do not soak in a bath tub, hot tub, pool, lake or other body of water until 21 days after your surgery and your incision is completely dry and healed.  If you have removable  sutures (or staples) they must be removed 10-14 days (unless otherwise instructed) from the day of your surgery.     1)  Elevate the extremity as much as possible.  2)  Keep the dressing clean and dry.  3)  Please call us if the dressing becomes wet or dirty.  4)  If you are experiencing worsening pain or worsening swelling, please call.     MEDICATIONS: Resume all previous home medications at the previous prescribed dose and frequency unless otherwise noted Start taking the  pain medications on an as-needed basis as prescribed  Please taper down pain medication over the next week following surgery.  Ideally you should not require a refill of any narcotic pain medication.  Take pain medication with food to minimize nausea. In addition to the prescribed pain medication, you may take over-the-counter pain relievers such as Tylenol.  Do NOT take additional tylenol if your pain medication already has tylenol in it.  Aspirin 325mg  daily per bottle instructions. Narcotic Policy: Per [facility] policy, our goal is ensure optimal postoperative pain control with a multimodal pain management strategy. For all [facility] patients, our goal is to wean post-operative narcotic medications by 6 weeks post-operatively, and many times sooner. If this is not possible due to utilization of pain medication prior to surgery, your [facility] doctor will support your acute post-operative pain control for the first 6 weeks postoperatively, with a plan to transition you back to your primary pain team following that. Cyndia Skeeters will work to ensure a Therapist, occupational.  FOLLOWUP INSTRUCTIONS: 1. Follow up at the Physical Therapy Clinic 3-4 days following surgery. This appointment should be scheduled unless other arrangements have been made.The Physical Therapy scheduling number is [phone] if an appointment has not already been arranged.  2. Contact Dr. Serena Croissant office during office hours at [phone] or  the practice after hours line at [phone] for non-emergencies. For medical emergencies call 911.   Discharge Location: Home    Post Anesthesia Home Care Instructions  Activity:Next tylenol dose 4:48pm Get plenty of rest for the remainder of the day. A responsible individual must stay with you for 24 hours following the procedure.  For the next 24 hours, DO NOT: -Drive a car -Advertising copywriter -Drink alcoholic beverages -Take any medication unless instructed by your physician -Make any legal decisions or sign important papers.  Meals: Start with liquid foods such as gelatin or soup. Progress to regular foods as tolerated. Avoid greasy, spicy, heavy foods. If nausea and/or vomiting occur, drink only clear liquids until the nausea and/or vomiting subsides. Call your physician if vomiting continues.  Special Instructions/Symptoms: Your throat may feel dry or sore from the anesthesia or the breathing tube placed in your throat during surgery. If this causes discomfort, gargle with warm salt water. The discomfort should disappear within 24 hours.  If you had a scopolamine patch placed behind your ear for the management of post- operative nausea and/or vomiting:  1. The medication in the patch is effective for 72 hours, after which it should be removed.  Wrap patch in a tissue and discard in the trash. Wash hands thoroughly with soap and water. 2. You may remove the patch earlier than 72 hours if you experience unpleasant side effects which may include dry mouth, dizziness or visual disturbances. 3. Avoid touching the patch. Wash your hands with soap and water after contact with the patch.    Post Anesthesia Home Care Instructions  Activity: Get plenty of rest for the remainder of the day. A responsible individual must stay with you for 24 hours following the procedure.  For the next 24 hours, DO NOT: -Drive a car -Advertising copywriter -Drink alcoholic beverages -Take any medication  unless instructed by your physician -Make any legal decisions or sign important papers.  Meals: Start with liquid foods such as gelatin or soup. Progress to regular foods as tolerated. Avoid greasy, spicy, heavy foods. If nausea and/or vomiting occur, drink only clear liquids until the nausea and/or vomiting subsides. Call your physician if vomiting continues.  Special Instructions/Symptoms: Your throat may feel dry or sore from the anesthesia or the breathing tube placed in your throat during surgery. If this causes discomfort, gargle with warm salt water. The discomfort should disappear within 24 hours.  If you had a scopolamine patch placed behind your ear for the management of post- operative nausea and/or vomiting:  1. The medication in the patch is effective for 72 hours, after which it should be removed.  Wrap patch in a tissue and discard in the trash. Wash hands thoroughly with soap and water. 2. You may remove the patch earlier than 72 hours if you experience unpleasant side effects which may include dry mouth, dizziness or visual disturbances. 3. Avoid touching the patch. Wash your hands with soap and water after contact with the patch.  No Tylenol until 4:48 pm, if needed     Regional Anesthesia Blocks  1. You may not be able to move or feel the "blocked" extremity after a regional anesthetic block.  This may last may last from 3-48 hours after placement, but it will go away. The length of time depends on the medication injected and your individual response to the medication. As the nerves start to wake up, you may experience tingling as the movement and feeling returns to your extremity. If the numbness and inability to move your extremity has not gone away after 48 hours, please call your surgeon.   2. The extremity that is blocked will need to be protected until the numbness is gone and the strength has returned. Because you cannot feel it, you will need to take extra care to  avoid injury. Because it may be weak, you may have difficulty moving it or using it. You may not know what position it is in without looking at it while the block is in effect.  3. For blocks in the legs and feet, returning to weight bearing and walking needs to be done carefully. You will need to wait until the numbness is entirely gone and the strength has returned. You should be able to move your leg and foot normally before you try and bear weight or walk. You will need someone to be with you when you first try to ensure you do not fall and possibly risk injury.  4. Bruising and tenderness at the needle site are common side effects and will resolve in a few days.  5. Persistent numbness or new problems with movement should be communicated to the surgeon or the [facility] [phone] [facility] [phone]).Information for Discharge Teaching: EXPAREL (bupivacaine liposome injectable suspension)   Pain relief is important to your recovery. The goal is to control your pain so you can move easier and return to your normal activities as soon as possible after your procedure. Your physician may use several types of medicines to manage pain, swelling, and more.  Your surgeon or anesthesiologist gave you EXPAREL(bupivacaine) to help control your pain after surgery.  EXPAREL is a local anesthetic designed to release slowly over an extended period of time to provide pain relief by numbing the tissue around the surgical site. EXPAREL is designed to release pain medication over time and can control pain for up to 72 hours. Depending on how you respond to EXPAREL, you may require less pain medication during your recovery. EXPAREL can help reduce or eliminate the need for opioids during the first few days after surgery when pain relief is needed the most. EXPAREL is not an opioid and is not addictive. It does not cause sleepiness or sedation.   Important! A teal colored band  has been placed on your arm with the date, time and amount of EXPAREL you have received. Please leave this armband in place for the full 96 hours following administration, and then you may remove the band. If you return to the hospital for any reason within 96 hours following the administration of EXPAREL, the armband provides important information that your health care providers to know, and alerts them that you have received this anesthetic.    Possible side effects of EXPAREL: Temporary loss of sensation or ability to move in the area where medication was injected. Nausea, vomiting, constipation Rarely, numbness and tingling in your mouth or lips, lightheadedness, or anxiety may occur. Call your doctor right away if you think you may be experiencing any of these sensations, or if you have other questions regarding possible side effects.  Follow all other discharge instructions given to you by your  surgeon or nurse. Eat a healthy diet and drink plenty of water or other fluids.

## 2023-05-08 ENCOUNTER — Encounter (HOSPITAL_BASED_OUTPATIENT_CLINIC_OR_DEPARTMENT_OTHER): Payer: Self-pay | Admitting: Orthopaedic Surgery

## 2023-05-08 NOTE — Anesthesia Postprocedure Evaluation (Signed)
 Anesthesia Post Note  Patient: Seth Shields  Procedure(s) Performed: ARTHROSCOPY, SHOULDER, WITH GLENOID LABRUM REPAIR (Left: Shoulder)     Patient location during evaluation: PACU Anesthesia Type: General Level of consciousness: sedated and patient cooperative Pain management: pain level controlled Vital Signs Assessment: post-procedure vital signs reviewed and stable Respiratory status: spontaneous breathing Cardiovascular status: stable Anesthetic complications: no   No notable events documented.  Last Vitals:  Vitals:   05/07/23 1610 05/07/23 1638  BP: 121/82 (!) 133/91  Pulse:  78  Resp:  16  Temp:  36.7 C  SpO2:  96%    Last Pain:  Vitals:   05/07/23 1638  TempSrc:   PainSc: 2                  Lewie Loron

## 2023-05-09 ENCOUNTER — Encounter (HOSPITAL_BASED_OUTPATIENT_CLINIC_OR_DEPARTMENT_OTHER): Payer: Self-pay | Admitting: Physical Therapy

## 2023-05-09 ENCOUNTER — Ambulatory Visit (HOSPITAL_BASED_OUTPATIENT_CLINIC_OR_DEPARTMENT_OTHER): Attending: Orthopaedic Surgery | Admitting: Physical Therapy

## 2023-05-09 ENCOUNTER — Other Ambulatory Visit: Payer: Self-pay

## 2023-05-09 DIAGNOSIS — M25612 Stiffness of left shoulder, not elsewhere classified: Secondary | ICD-10-CM | POA: Insufficient documentation

## 2023-05-09 DIAGNOSIS — M25312 Other instability, left shoulder: Secondary | ICD-10-CM | POA: Diagnosis not present

## 2023-05-09 DIAGNOSIS — M6281 Muscle weakness (generalized): Secondary | ICD-10-CM | POA: Insufficient documentation

## 2023-05-09 DIAGNOSIS — M25512 Pain in left shoulder: Secondary | ICD-10-CM | POA: Diagnosis not present

## 2023-05-09 DIAGNOSIS — M546 Pain in thoracic spine: Secondary | ICD-10-CM | POA: Diagnosis not present

## 2023-05-09 NOTE — Therapy (Signed)
 OUTPATIENT PHYSICAL THERAPY SHOULDER EVALUATION   Patient Name: Seth Shields MRN: [id number] DOB:1990-04-06, 33 y.o., male Today's Date: 05/09/2023  END OF SESSION:  PT End of Session - 05/09/23 1430     Visit Number 1    Number of Visits 25    Date for PT Re-Evaluation 08/01/23    Authorization Type BCBS    PT Start Time 1433    PT Stop Time 1523    PT Time Calculation (min) 50 min    Activity Tolerance Patient tolerated treatment well             Past Medical History:  Diagnosis Date   BPH (benign prostatic hyperplasia)    Complication of anesthesia    woke up aggressive after hernia surgery   Inguinal hernia    Labral tear of shoulder    Past Surgical History:  Procedure Laterality Date   HERNIA REPAIR     SHOULDER ARTHROSCOPY WITH LABRAL REPAIR Left 05/07/2023   Procedure: ARTHROSCOPY, SHOULDER, WITH GLENOID LABRUM REPAIR;  Surgeon: Huel Cote, MD;  Location: [facility];  Service: Orthopedics;  Laterality: Left;   Patient Active Problem List   Diagnosis Date Noted   Instability of left shoulder joint 05/07/2023   Chronic left shoulder pain 03/26/2023   Solitary pulmonary nodule 10/02/2018   Dyspnea 10/02/2018   Scoliosis deformity of spine 09/21/2017    PCP: Jarold Motto, PA  REFERRING PROVIDER: Huel Cote, MD  REFERRING DIAG: [phone] (ICD-10-CM) - Instability of left shoulder joint  THERAPY DIAG:  Left shoulder pain, unspecified chronicity  Stiffness of left shoulder, not elsewhere classified  Muscle weakness (generalized)  Rationale for Evaluation and Treatment: Rehabilitation  ONSET DATE: L shoulder labral repair 05/07/23  SUBJECTIVE:                                                                                                                                                                                      SUBJECTIVE STATEMENT: Pt endorses difficulty dressing and donning/doffing sling but able to perform  independently. Avoiding housework and usual activities as expected post op. States he had some N/T in hand and forearm yesterday but transient, and hasn't noticed any today. Has been using the [employer]. Endorses difficulty sleeping, has been trying to sleep in bed w/ pillow support. Reports good adherence w/ sling use.  Denies fevers/chills, SOB, or excessive swelling.  Hand dominance: Right  PERTINENT HISTORY: BPH, L labral repair DOS 05/07/23  PAIN:  Are you having pain: 4-5/10 Location/description: L posterolateral shoulder and shoulder blade  Best-worst over past week: 1-7/10  - aggravating factors: general movement/activity  - Easing factors: medication, icing  PRECAUTIONS: L shoulder Bokshan labral repair protocol  RED FLAGS: None   WEIGHT BEARING RESTRICTIONS: Yes NWB  FALLS:  Has patient fallen in last 6 months? No  LIVING ENVIRONMENT: 1 story home + basement w/ spouse who works  Pt usually does most of cooking, otherwise split housework Has two dogs, beagle and mini aussie   OCCUPATION: Attorney full time  PLOF: Independent - enjoys MMA, going to gym, walk dogs  PATIENT GOALS: get back to PLOF, full mobility, get back in gym   NEXT MD VISIT: 05/29/23  OBJECTIVE:  Note: Objective measures were completed at Evaluation unless otherwise noted.  DIAGNOSTIC FINDINGS:  DOS 05/07/23 labral repair L shoulder  PATIENT SURVEYS:  Quick Dash 75%  COGNITION: Overall cognitive status: Within functional limits for tasks assessed     SENSATION: Denies sensory complaints today  POSTURE: Sling donned ; guarded LUE posture as expected post op  UPPER EXTREMITY ROM:  Passive ROM  Right eval Left eval  Shoulder flexion  P: <25 deg limited by muscle guarding  Shoulder abduction  P: ~30 deg limited by muscle guarding    Shoulder internal rotation    Shoulder external rotation (at side unless otherwise noted)    Elbow flexion    Elbow extension    Wrist flexion    Wrist  extension     (Blank rows = not tested) (Key: WFL = within functional limits not formally assessed, * = concordant pain, s = stiffness/stretching sensation, NT = not tested)  Comments:   UPPER EXTREMITY MMT:  MMT Right eval Left eval  Shoulder flexion    Shoulder extension    Shoulder abduction    Shoulder adduction    Shoulder internal rotation    Shoulder external rotation    Middle trapezius    Lower trapezius    Elbow flexion    Elbow extension    Wrist flexion    Wrist extension    Wrist ulnar deviation    Wrist radial deviation    Wrist pronation    Wrist supination    Grip strength (lbs)    (Blank rows = not tested) (Key: WFL = within functional limits not formally assessed, * = concordant pain, s = stiffness/stretching sensation, NT = not tested)  Comments: deferred on eval given post op protocol and acuity of surgery   PALPATION/OBSERVATION:  3 portal incisions (anterior, lateral, and posterior) appear grossly WNL, mild bleeding apparent anterior incision but not excessive. No significant erythema or swelling apparent, no excessive warmth.  Able to doff sling w/o assist, inc time/effort and guarding noted of LUE Min assist to don sling Able to perform upper body dressing for wound inspection w/o assist, inc time/effort noted and guarding LUE                                                                                                                              TREATMENT DATE:  [facility] Adult PT Treatment:  DATE: 05/09/23 Therapeutic Exercise: Elbow flex/ext AAROM, wrist supination/pronation AAROM, towel gripping practice reps, HEP handout  Self Care: Education on sling use, phase one of rehab protocol, surgical restrictions, monitoring surgical site for complications and communication w/ provider as indicated, continuing icing regimen, wound inspection + replacing dressing w/ gauze/tegaderm      PATIENT  EDUCATION: Education details: Pt education on PT impairments, prognosis, and POC. Informed consent. Rationale for interventions, safe/appropriate HEP performance, post op precautions and self care as above Person educated: Patient Education method: Explanation, Demonstration, Tactile cues, Verbal cues Education comprehension: verbalized understanding, returned demonstration, verbal cues required, tactile cues required, and needs further education    HOME EXERCISE PROGRAM: Access Code: [email or web address] URL: [email or web address] Date: 05/09/2023 Prepared by: Fransisco Hertz  Exercises - Seated Elbow Flexion AAROM  - 2-3 x daily - 7 x weekly - 1 sets - 10 reps - Seated AAROM Elbow Supination/Pronation with Clasped Hands  - 2-3 x daily - 7 x weekly - 1 sets - 10 reps - Seated Gripping Towel  - 2-3 x daily - 7 x weekly - 1 sets - 10 reps  ASSESSMENT:  CLINICAL IMPRESSION: Patient is a pleasant 33 y.o. gentleman who was seen today for physical therapy evaluation and treatment for L shoulder labral repair DOS 05/07/23. Pt arrives w/ sling and 4-5/10 pain, endorses limitations in mobility and ADLs as expected post op. Much of session spent w/ education on post op restrictions, sling use, monitoring surgical site, and rehab protocol. He is able to doff sling w/o assist, min assist to don; able to perform upper body dressing w/ T shirt independently w/ increased time/effort. Incisions inspected and are well appearing, replaced dressing w/ gauze/tegaderm. PROM is gently assessed within protocol today although is limited by muscle guarding. No adverse events, pt tolerates session well with mild increase in pain on departure (6/10 compared to 4-5/10 on departure). Recommend skilled PT to address aforementioned deficits with aim of improving functional tolerance and reducing pain with typical activities. Pt departs today's session in no acute distress, all voiced concerns/questions addressed appropriately  from PT perspective.    OBJECTIVE IMPAIRMENTS: decreased activity tolerance, decreased mobility, decreased ROM, decreased strength, impaired perceived functional ability, impaired UE functional use, postural dysfunction, and pain.   ACTIVITY LIMITATIONS: carrying, lifting, sleeping, bathing, dressing, reach over head, and hygiene/grooming  PARTICIPATION LIMITATIONS: meal prep, cleaning, laundry, driving, community activity, and occupation  PERSONAL FACTORS: Age and Time since onset of injury/illness/exacerbation are also affecting patient's functional outcome.   REHAB POTENTIAL: Good  CLINICAL DECISION MAKING: Stable/uncomplicated  EVALUATION COMPLEXITY: Low   GOALS:   SHORT TERM GOALS: Target date: 06/20/2023   Pt will demonstrate appropriate understanding and performance of initially prescribed HEP in order to facilitate improved independence with management of symptoms.  Baseline: HEP established  Goal status: INITIAL   2. Pt will report at least 25% improvement in overall pain levels over past week in order to facilitate improved tolerance to typical daily activities.   Baseline: 1-7/10  Goal status: INITIAL    LONG TERM GOALS: Target date: 08/01/2023 Pt will score less than or equal to 40% on Quick DASH in order to indicate reduced levels of disability due to shoulder pain (MDC 16-20pts).  Baseline: 75%   Goal status: INITIAL  2.  Pt will demonstrate at least 150 degrees of active shoulder elevation on surgical limb in order to demonstrate improved tolerance to functional movement patterns such as reaching overhead.  Baseline: see ROM chart above  Goal status: INITIAL  3.  Pt will demonstrate at least 4+/5 shoulder flex/abd MMT for improved symmetry of UE strength and improved tolerance to functional movements.  Baseline: deferred on eval given surgical protocol Goal status: INITIAL  4. Pt will report ability to perform upper body dressing w/ less than 2 pt increase in  pain in order to facilitate improved tolerance to ADLs.  Baseline: 1-7/10 pain with usual activities, inc time/difficulty performing in clinic  Goal status: INITIAL   5. Pt will report at least 50% decrease in overall pain levels in past week in order to facilitate improved tolerance to basic ADLs/mobility.   Baseline: 1-7/10  Goal status: INITIAL   6. Pt will endorse ability to perform usual household tasks with no more than 2 pt increase in pain in order to return to PLOF.   Baseline: housework limited as expected post op Goal status: INITIAL   PLAN:  PT FREQUENCY: 1-2x/week  PT DURATION: 12 weeks  PLANNED INTERVENTIONS: 97164- PT Re-evaluation, 97110-Therapeutic exercises, 97530- Therapeutic activity, 97112- Neuromuscular re-education, 97535- Self Care, 19147- Manual therapy, G0283- Electrical stimulation (unattended), Patient/Family education, Taping, Dry Needling, Joint mobilization, Spinal mobilization, Scar mobilization, Cryotherapy, and Moist heat  PLAN FOR NEXT SESSION: continue to progress as able/appropriate per Bokshan shoulder labral repair protocol  Ashley Murrain, PT 05/09/2023, 4:37 PM

## 2023-05-16 ENCOUNTER — Encounter (HOSPITAL_BASED_OUTPATIENT_CLINIC_OR_DEPARTMENT_OTHER): Payer: Self-pay | Admitting: Physical Therapy

## 2023-05-16 ENCOUNTER — Ambulatory Visit (HOSPITAL_BASED_OUTPATIENT_CLINIC_OR_DEPARTMENT_OTHER): Admitting: Physical Therapy

## 2023-05-16 DIAGNOSIS — M6281 Muscle weakness (generalized): Secondary | ICD-10-CM | POA: Diagnosis not present

## 2023-05-16 DIAGNOSIS — M25512 Pain in left shoulder: Secondary | ICD-10-CM | POA: Diagnosis not present

## 2023-05-16 DIAGNOSIS — M546 Pain in thoracic spine: Secondary | ICD-10-CM | POA: Diagnosis not present

## 2023-05-16 DIAGNOSIS — M25612 Stiffness of left shoulder, not elsewhere classified: Secondary | ICD-10-CM

## 2023-05-16 DIAGNOSIS — M25312 Other instability, left shoulder: Secondary | ICD-10-CM | POA: Diagnosis not present

## 2023-05-16 NOTE — Therapy (Addendum)
 OUTPATIENT PHYSICAL THERAPY SHOULDER TREATMENT   Patient Name: Seth Shields MRN: [id number] DOB:[birth year], 33 y.o., male Today's Date: 05/16/2023  END OF SESSION:  PT End of Session - 05/16/23 0720     Visit Number 2    Number of Visits 25    Date for PT Re-Evaluation 08/01/23    Authorization Type BCBS    PT Start Time 0717    PT Stop Time 0759    PT Time Calculation (min) 42 min    Behavior During Therapy [facility] for tasks assessed/performed             Past Medical History:  Diagnosis Date   BPH (benign prostatic hyperplasia)    Complication of anesthesia    woke up aggressive after hernia surgery   Inguinal hernia    Labral tear of shoulder    Past Surgical History:  Procedure Laterality Date   HERNIA REPAIR     SHOULDER ARTHROSCOPY WITH LABRAL REPAIR Left 05/07/2023   Procedure: ARTHROSCOPY, SHOULDER, WITH GLENOID LABRUM REPAIR;  Surgeon: Huel Cote, MD;  Location: [facility];  Service: Orthopedics;  Laterality: Left;   Patient Active Problem List   Diagnosis Date Noted   Instability of left shoulder joint 05/07/2023   Chronic left shoulder pain 03/26/2023   Solitary pulmonary nodule 10/02/2018   Dyspnea 10/02/2018   Scoliosis deformity of spine 09/21/2017    PCP: Jarold Motto, PA  REFERRING PROVIDER: Huel Cote, MD  REFERRING DIAG: [phone] (ICD-10-CM) - Instability of left shoulder joint  THERAPY DIAG:  Left shoulder pain, unspecified chronicity  Stiffness of left shoulder, not elsewhere classified  Muscle weakness (generalized)  Rationale for Evaluation and Treatment: Rehabilitation  ONSET DATE: L shoulder labral repair 05/07/23  SUBJECTIVE:                                                                                                                                                                                      SUBJECTIVE STATEMENT: Pt reports a client patted and squeezed his L shoulder yesterday, right over the  front incision, spiking his pain and it lasted throughout night.  He reports compliance with HEP and wearing sling. He has been icing frequently.    Hand dominance: Right  PERTINENT HISTORY: BPH, L labral repair DOS 05/07/23  PAIN:  Are you having pain: 5-6/10 Location/description: L anterolateral shoulder  Best-worst over past week: 1-7/10  - aggravating factors: general movement/activity  - Easing factors: medication, icing    PRECAUTIONS: L shoulder Bokshan labral repair protocol  RED FLAGS: None   WEIGHT BEARING RESTRICTIONS: Yes NWB  FALLS:  Has patient fallen in last 6 months? No  LIVING  ENVIRONMENT: 1 story home + basement w/ spouse who works  Pt usually does most of cooking, otherwise split housework Has two dogs, beagle and mini aussie   OCCUPATION: Attorney full time  PLOF: Independent - enjoys MMA, going to gym, walk dogs  PATIENT GOALS: get back to PLOF, full mobility, get back in gym   NEXT MD VISIT: 05/29/23  OBJECTIVE:  Note: Objective measures were completed at Evaluation unless otherwise noted.  DIAGNOSTIC FINDINGS:  DOS 05/07/23 labral repair L shoulder  PATIENT SURVEYS:  Quick Dash 75%  COGNITION: Overall cognitive status: Within functional limits for tasks assessed     SENSATION: Denies sensory complaints today  POSTURE: Sling donned ; guarded LUE posture as expected post op  UPPER EXTREMITY ROM:  Passive ROM  Right eval Left eval  Shoulder flexion  P: <25 deg limited by muscle guarding  Shoulder abduction  P: ~30 deg limited by muscle guarding    Shoulder internal rotation    Shoulder external rotation (at side unless otherwise noted)    Elbow flexion    Elbow extension    Wrist flexion    Wrist extension     (Blank rows = not tested) (Key: WFL = within functional limits not formally assessed, * = concordant pain, s = stiffness/stretching sensation, NT = not tested)  Comments:   UPPER EXTREMITY MMT:  MMT Right eval  Left eval  Shoulder flexion    Shoulder extension    Shoulder abduction    Shoulder adduction    Shoulder internal rotation    Shoulder external rotation    Middle trapezius    Lower trapezius    Elbow flexion    Elbow extension    Wrist flexion    Wrist extension    Wrist ulnar deviation    Wrist radial deviation    Wrist pronation    Wrist supination    Grip strength (lbs)    (Blank rows = not tested) (Key: WFL = within functional limits not formally assessed, * = concordant pain, s = stiffness/stretching sensation, NT = not tested)  Comments: deferred on eval given post op protocol and acuity of surgery   PALPATION/OBSERVATION:  3 portal incisions (anterior, lateral, and posterior) appear grossly WNL, mild bleeding apparent anterior incision but not excessive. No significant erythema or swelling apparent, no excessive warmth.  Able to doff sling w/o assist, inc time/effort and guarding noted of LUE Min assist to don sling Able to perform upper body dressing for wound inspection w/o assist, inc time/effort noted and guarding LUE                                                                                                                              TREATMENT DATE:  [facility] Adult PT Treatment:  DATE: 05/16/23 Therapeutic Exercise: L Elbow flex/ext AAROM L wrist supination/pronation AAROM Beginner pendulum   Manual Therapy: PROM of L shoulder into flexion (~45), abdct (~25), ER, IR -- within rehab protocol restrictions and pt tolerance STM to L cervical paraspinals, L levator and upper trap to decrease fascial restrictions Gentle STM/IASTM to distal-mid biceps brachii   Self Care: Education on rehab protocol, continuing icing regimen, wound inspection + replacing dressing w/ gauze/tegaderm     [facility] Adult PT Treatment:                                                DATE: 05/09/23 Therapeutic Exercise: Elbow flex/ext AAROM,  wrist supination/pronation AAROM, towel gripping practice reps, HEP handout  Self Care: Education on sling use, phase one of rehab protocol, surgical restrictions, monitoring surgical site for complications and communication w/ provider as indicated, continuing icing regimen, wound inspection + replacing dressing w/ gauze/tegaderm      PATIENT EDUCATION: Education details: Pt education on PT impairments, prognosis, and POC. Informed consent. Rationale for interventions, safe/appropriate HEP performance, post op precautions and self care as above Person educated: Patient Education method: Explanation, Demonstration, Tactile cues, Verbal cues Education comprehension: verbalized understanding, returned demonstration, verbal cues required, tactile cues required, and needs further education    HOME EXERCISE PROGRAM: Access Code: [email or web address] URL: [email or web address] Date: 05/09/2023 Prepared by: Fransisco Hertz  Exercises - Seated Elbow Flexion AAROM  - 2-3 x daily - 7 x weekly - 1 sets - 10 reps - Seated AAROM Elbow Supination/Pronation with Clasped Hands  - 2-3 x daily - 7 x weekly - 1 sets - 10 reps - Seated Gripping Towel  - 2-3 x daily - 7 x weekly - 1 sets - 10 reps  ASSESSMENT:  CLINICAL IMPRESSION: Pt is 1wk and 2 days s/p L shoulder labral repair. Incisions inspected and are well appearing with minimal to no drainage; replaced dressing w/ gauze/tegaderm. PROM is performed within protocol today, although is limited by muscle guarding. Pt tolerates session well.  Pt will benefit from skilled PT to address deficits with aim of improving functional tolerance and reducing pain with typical activities.    OBJECTIVE IMPAIRMENTS: decreased activity tolerance, decreased mobility, decreased ROM, decreased strength, impaired perceived functional ability, impaired UE functional use, postural dysfunction, and pain.   ACTIVITY LIMITATIONS: carrying, lifting, sleeping, bathing,  dressing, reach over head, and hygiene/grooming  PARTICIPATION LIMITATIONS: meal prep, cleaning, laundry, driving, community activity, and occupation  PERSONAL FACTORS: Age and Time since onset of injury/illness/exacerbation are also affecting patient's functional outcome.   REHAB POTENTIAL: Good  CLINICAL DECISION MAKING: Stable/uncomplicated  EVALUATION COMPLEXITY: Low   GOALS:   SHORT TERM GOALS: Target date: 06/20/2023   Pt will demonstrate appropriate understanding and performance of initially prescribed HEP in order to facilitate improved independence with management of symptoms.  Baseline: HEP established  Goal status: INITIAL   2. Pt will report at least 25% improvement in overall pain levels over past week in order to facilitate improved tolerance to typical daily activities.   Baseline: 1-7/10  Goal status: INITIAL    LONG TERM GOALS: Target date: 08/01/2023 Pt will score less than or equal to 40% on Quick DASH in order to indicate reduced levels of disability due to shoulder pain (MDC 16-20pts).  Baseline: 75%   Goal status: INITIAL  2.  Pt will  demonstrate at least 150 degrees of active shoulder elevation on surgical limb in order to demonstrate improved tolerance to functional movement patterns such as reaching overhead.  Baseline: see ROM chart above Goal status: INITIAL  3.  Pt will demonstrate at least 4+/5 shoulder flex/abd MMT for improved symmetry of UE strength and improved tolerance to functional movements.  Baseline: deferred on eval given surgical protocol Goal status: INITIAL  4. Pt will report ability to perform upper body dressing w/ less than 2 pt increase in pain in order to facilitate improved tolerance to ADLs.  Baseline: 1-7/10 pain with usual activities, inc time/difficulty performing in clinic  Goal status: INITIAL   5. Pt will report at least 50% decrease in overall pain levels in past week in order to facilitate improved tolerance to basic  ADLs/mobility.   Baseline: 1-7/10  Goal status: INITIAL   6. Pt will endorse ability to perform usual household tasks with no more than 2 pt increase in pain in order to return to PLOF.   Baseline: housework limited as expected post op Goal status: INITIAL   PLAN:  PT FREQUENCY: 1-2x/week  PT DURATION: 12 weeks  PLANNED INTERVENTIONS: 97164- PT Re-evaluation, 97110-Therapeutic exercises, 97530- Therapeutic activity, 97112- Neuromuscular re-education, 97535- Self Care, 16109- Manual therapy, G0283- Electrical stimulation (unattended), Patient/Family education, Taping, Dry Needling, Joint mobilization, Spinal mobilization, Scar mobilization, Cryotherapy, and Moist heat  PLAN FOR NEXT SESSION: continue to progress as able/appropriate per [facility] shoulder labral repair protocol  Mayer Camel, PTA 05/16/23 12:55 PM [facility] [address] [place], [place], [address] Phone: [phone]   Fax:  [phone]

## 2023-05-17 ENCOUNTER — Other Ambulatory Visit: Payer: Self-pay | Admitting: Family Medicine

## 2023-05-17 DIAGNOSIS — M25512 Pain in left shoulder: Secondary | ICD-10-CM

## 2023-05-17 NOTE — Telephone Encounter (Signed)
 Last OV 03/19/23 Next OV not scheduled  Last refill 04/15/23 Qty # 30   Renal labs 01/08/22  Component Ref Range & Units (hover) 1 yr ago 3 yr ago 4 yr ago  Sodium 137 140 139  Potassium 4.6 5.1 5.1  Chloride 101 105 102  CO2 29 28 29   Glucose, Bld 72 99 95  BUN 15 17 17   Creatinine, Ser 1.43 1.31 1.15  Total Bilirubin 0.5 0.7 0.5  Alkaline Phosphatase 63 55 59  AST 25 25 19   ALT 24 19 18   Total Protein 6.8 7.0 7.1  Albumin 4.7 4.9 4.9  GFR 65.95 65.40 76.43  Comment: Calculated using the CKD-EPI Creatinine Equation (2021)  Calcium 9.8 9.7 9.9

## 2023-05-21 ENCOUNTER — Ambulatory Visit (HOSPITAL_BASED_OUTPATIENT_CLINIC_OR_DEPARTMENT_OTHER): Admitting: Physical Therapy

## 2023-05-21 ENCOUNTER — Encounter (HOSPITAL_BASED_OUTPATIENT_CLINIC_OR_DEPARTMENT_OTHER): Payer: Self-pay | Admitting: Physical Therapy

## 2023-05-21 DIAGNOSIS — M546 Pain in thoracic spine: Secondary | ICD-10-CM | POA: Diagnosis not present

## 2023-05-21 DIAGNOSIS — M25612 Stiffness of left shoulder, not elsewhere classified: Secondary | ICD-10-CM

## 2023-05-21 DIAGNOSIS — M6281 Muscle weakness (generalized): Secondary | ICD-10-CM | POA: Diagnosis not present

## 2023-05-21 DIAGNOSIS — M25512 Pain in left shoulder: Secondary | ICD-10-CM | POA: Diagnosis not present

## 2023-05-21 DIAGNOSIS — M25312 Other instability, left shoulder: Secondary | ICD-10-CM | POA: Diagnosis not present

## 2023-05-21 NOTE — Therapy (Signed)
 OUTPATIENT PHYSICAL THERAPY SHOULDER TREATMENT   Patient Name: Seth Shields MRN: [id number] DOB:02/06/1991, 33 y.o., male Today's Date: 05/21/2023  END OF SESSION:  PT End of Session - 05/21/23 0749     Visit Number 3    Number of Visits 25    Date for PT Re-Evaluation 08/01/23    Authorization Type BCBS    PT Start Time 0715    PT Stop Time 0745    PT Time Calculation (min) 30 min    Activity Tolerance Patient tolerated treatment well    Behavior During Therapy WFL for tasks assessed/performed              Past Medical History:  Diagnosis Date   BPH (benign prostatic hyperplasia)    Complication of anesthesia    woke up aggressive after hernia surgery   Inguinal hernia    Labral tear of shoulder    Past Surgical History:  Procedure Laterality Date   HERNIA REPAIR     SHOULDER ARTHROSCOPY WITH LABRAL REPAIR Left 05/07/2023   Procedure: ARTHROSCOPY, SHOULDER, WITH GLENOID LABRUM REPAIR;  Surgeon: Wilhelmenia Harada, MD;  Location: [facility];  Service: Orthopedics;  Laterality: Left;   Patient Active Problem List   Diagnosis Date Noted   Instability of left shoulder joint 05/07/2023   Chronic left shoulder pain 03/26/2023   Solitary pulmonary nodule 10/02/2018   Dyspnea 10/02/2018   Scoliosis deformity of spine 09/21/2017    PCP: Alexander Iba, PA  REFERRING PROVIDER: Wilhelmenia Harada, MD  REFERRING DIAG: [phone] (ICD-10-CM) - Instability of left shoulder joint  THERAPY DIAG:  Left shoulder pain, unspecified chronicity  Stiffness of left shoulder, not elsewhere classified  Muscle weakness (generalized)  Rationale for Evaluation and Treatment: Rehabilitation  ONSET DATE: L shoulder labral repair 05/07/23  SUBJECTIVE:                                                                                                                                                                                      SUBJECTIVE STATEMENT: A little n/t but  better than last week.    Hand dominance: Right  PERTINENT HISTORY: BPH, L labral repair DOS 05/07/23  PAIN:  Are you having pain: 5-6/10 Location/description: L anterolateral shoulder  Best-worst over past week: 1-7/10  - aggravating factors: general movement/activity  - Easing factors: medication, icing    PRECAUTIONS: L shoulder Bokshan labral repair protocol  RED FLAGS: None   WEIGHT BEARING RESTRICTIONS: Yes NWB  FALLS:  Has patient fallen in last 6 months? No  LIVING ENVIRONMENT: 1 story home + basement w/ spouse who works  Pt usually does most of cooking, otherwise split housework  Has two dogs, beagle and mini aussie   OCCUPATION: Attorney full time  PLOF: Independent - enjoys MMA, going to gym, walk dogs  PATIENT GOALS: get back to PLOF, full mobility, get back in gym   NEXT MD VISIT: 05/29/23  OBJECTIVE:  Note: Objective measures were completed at Evaluation unless otherwise noted.  DIAGNOSTIC FINDINGS:  DOS 05/07/23 labral repair L shoulder  PATIENT SURVEYS:  Quick Dash 75%  COGNITION: Overall cognitive status: Within functional limits for tasks assessed     SENSATION: Denies sensory complaints today  POSTURE: Sling donned ; guarded LUE posture as expected post op  UPPER EXTREMITY ROM:  Passive ROM  Right eval Left eval  Shoulder flexion  P: <25 deg limited by muscle guarding  Shoulder abduction  P: ~30 deg limited by muscle guarding    Shoulder internal rotation    Shoulder external rotation (at side unless otherwise noted)    Elbow flexion    Elbow extension    Wrist flexion    Wrist extension     (Blank rows = not tested) (Key: WFL = within functional limits not formally assessed, * = concordant pain, s = stiffness/stretching sensation, NT = not tested)  Comments:   UPPER EXTREMITY MMT:  MMT Right eval Left eval  Shoulder flexion    Shoulder extension    Shoulder abduction    Shoulder adduction    Shoulder internal rotation     Shoulder external rotation    Middle trapezius    Lower trapezius    Elbow flexion    Elbow extension    Wrist flexion    Wrist extension    Wrist ulnar deviation    Wrist radial deviation    Wrist pronation    Wrist supination    Grip strength (lbs)    (Blank rows = not tested) (Key: WFL = within functional limits not formally assessed, * = concordant pain, s = stiffness/stretching sensation, NT = not tested)  Comments: deferred on eval given post op protocol and acuity of surgery   PALPATION/OBSERVATION:  3 portal incisions (anterior, lateral, and posterior) appear grossly WNL, mild bleeding apparent anterior incision but not excessive. No significant erythema or swelling apparent, no excessive warmth.  Able to doff sling w/o assist, inc time/effort and guarding noted of LUE Min assist to don sling Able to perform upper body dressing for wound inspection w/o assist, inc time/effort noted and guarding LUE                                                                                                                              TREATMENT DATE:  Treatment                            4/15: Blank lines following charge title = not provided on this treatment date.   Manual:  TPDN No STM pecs, subscap, infraspintaus, levator, upper trap PROM  within protocol limits- easily able to achieve goals AP GHJ mobs at 30 deg abd There-ex:  There-Act:  Self Care:  Nuro-Re-ed:  Gait Training:   [facility] Adult PT Treatment:                                                DATE: 05/16/23 Therapeutic Exercise: L Elbow flex/ext AAROM L wrist supination/pronation AAROM Beginner pendulum   Manual Therapy: PROM of L shoulder into flexion (~45), abdct (~25), ER, IR -- within rehab protocol restrictions and pt tolerance STM to L cervical paraspinals, L levator and upper trap to decrease fascial restrictions Gentle STM/IASTM to distal-mid biceps brachii   Self Care: Education on rehab  protocol, continuing icing regimen, wound inspection + replacing dressing w/ gauze/tegaderm     [facility] Adult PT Treatment:                                                DATE: 05/09/23 Therapeutic Exercise: Elbow flex/ext AAROM, wrist supination/pronation AAROM, towel gripping practice reps, HEP handout  Self Care: Education on sling use, phase one of rehab protocol, surgical restrictions, monitoring surgical site for complications and communication w/ provider as indicated, continuing icing regimen, wound inspection + replacing dressing w/ gauze/tegaderm      PATIENT EDUCATION: Education details: Pt education on PT impairments, prognosis, and POC. Informed consent. Rationale for interventions, safe/appropriate HEP performance, post op precautions and self care as above Person educated: Patient Education method: Explanation, Demonstration, Tactile cues, Verbal cues Education comprehension: verbalized understanding, returned demonstration, verbal cues required, tactile cues required, and needs further education    HOME EXERCISE PROGRAM: Access Code: [email or web address] URL: [email or web address] Date: 05/09/2023 Prepared by: Fransisco Hertz  Exercises - Seated Elbow Flexion AAROM  - 2-3 x daily - 7 x weekly - 1 sets - 10 reps - Seated AAROM Elbow Supination/Pronation with Clasped Hands  - 2-3 x daily - 7 x weekly - 1 sets - 10 reps - Seated Gripping Towel  - 2-3 x daily - 7 x weekly - 1 sets - 10 reps  ASSESSMENT:  CLINICAL IMPRESSION: Able to move through ROM easily after reducing spasm in surrounding musculature. Tends to sit with forward rounded posture and will be mindful of posture. Asked that he come out of sling when at rest to reduce tightness.    OBJECTIVE IMPAIRMENTS: decreased activity tolerance, decreased mobility, decreased ROM, decreased strength, impaired perceived functional ability, impaired UE functional use, postural dysfunction, and pain.   ACTIVITY LIMITATIONS:  carrying, lifting, sleeping, bathing, dressing, reach over head, and hygiene/grooming  PARTICIPATION LIMITATIONS: meal prep, cleaning, laundry, driving, community activity, and occupation  PERSONAL FACTORS: Age and Time since onset of injury/illness/exacerbation are also affecting patient's functional outcome.   REHAB POTENTIAL: Good  CLINICAL DECISION MAKING: Stable/uncomplicated  EVALUATION COMPLEXITY: Low   GOALS:   SHORT TERM GOALS: Target date: 06/20/2023   Pt will demonstrate appropriate understanding and performance of initially prescribed HEP in order to facilitate improved independence with management of symptoms.  Baseline: HEP established  Goal status: INITIAL   2. Pt will report at least 25% improvement in overall pain levels over past week in order to facilitate improved tolerance to typical daily activities.   Baseline:  1-7/10  Goal status: INITIAL    LONG TERM GOALS: Target date: 08/01/2023 Pt will score less than or equal to 40% on Quick DASH in order to indicate reduced levels of disability due to shoulder pain (MDC 16-20pts).  Baseline: 75%   Goal status: INITIAL  2.  Pt will demonstrate at least 150 degrees of active shoulder elevation on surgical limb in order to demonstrate improved tolerance to functional movement patterns such as reaching overhead.  Baseline: see ROM chart above Goal status: INITIAL  3.  Pt will demonstrate at least 4+/5 shoulder flex/abd MMT for improved symmetry of UE strength and improved tolerance to functional movements.  Baseline: deferred on eval given surgical protocol Goal status: INITIAL  4. Pt will report ability to perform upper body dressing w/ less than 2 pt increase in pain in order to facilitate improved tolerance to ADLs.  Baseline: 1-7/10 pain with usual activities, inc time/difficulty performing in clinic  Goal status: INITIAL   5. Pt will report at least 50% decrease in overall pain levels in past week in order to  facilitate improved tolerance to basic ADLs/mobility.   Baseline: 1-7/10  Goal status: INITIAL   6. Pt will endorse ability to perform usual household tasks with no more than 2 pt increase in pain in order to return to PLOF.   Baseline: housework limited as expected post op Goal status: INITIAL   PLAN:  PT FREQUENCY: 1-2x/week  PT DURATION: 12 weeks  PLANNED INTERVENTIONS: 97164- PT Re-evaluation, 97110-Therapeutic exercises, 97530- Therapeutic activity, 97112- Neuromuscular re-education, 97535- Self Care, 87564- Manual therapy, G0283- Electrical stimulation (unattended), Patient/Family education, Taping, Dry Needling, Joint mobilization, Spinal mobilization, Scar mobilization, Cryotherapy, and Moist heat  PLAN FOR NEXT SESSION: continue to progress as able/appropriate per B[place] shoulder labral repair protocol  Lichelle Viets C. Maddox Bratcher PT, DPT 05/21/23 7:51 AM  [facility] [address] [place], [place], [address] Phone: [phone]   Fax:  [phone]

## 2023-05-29 ENCOUNTER — Ambulatory Visit (INDEPENDENT_AMBULATORY_CARE_PROVIDER_SITE_OTHER): Admitting: Orthopaedic Surgery

## 2023-05-29 ENCOUNTER — Encounter (HOSPITAL_BASED_OUTPATIENT_CLINIC_OR_DEPARTMENT_OTHER): Payer: Self-pay | Admitting: Physical Therapy

## 2023-05-29 ENCOUNTER — Ambulatory Visit (HOSPITAL_BASED_OUTPATIENT_CLINIC_OR_DEPARTMENT_OTHER): Admitting: Physical Therapy

## 2023-05-29 DIAGNOSIS — M25512 Pain in left shoulder: Secondary | ICD-10-CM

## 2023-05-29 DIAGNOSIS — M6281 Muscle weakness (generalized): Secondary | ICD-10-CM | POA: Diagnosis not present

## 2023-05-29 DIAGNOSIS — M25612 Stiffness of left shoulder, not elsewhere classified: Secondary | ICD-10-CM

## 2023-05-29 DIAGNOSIS — M25312 Other instability, left shoulder: Secondary | ICD-10-CM | POA: Diagnosis not present

## 2023-05-29 DIAGNOSIS — M546 Pain in thoracic spine: Secondary | ICD-10-CM | POA: Diagnosis not present

## 2023-05-29 NOTE — Therapy (Signed)
 OUTPATIENT PHYSICAL THERAPY SHOULDER TREATMENT   Patient Name: Seth Shields MRN: [id number] DOB:1990-11-03, 33 y.o., male Today's Date: 05/29/2023  END OF SESSION:  PT End of Session - 05/29/23 0740     Visit Number 4    Number of Visits 25    Date for PT Re-Evaluation 08/01/23    Authorization Type BCBS    PT Start Time 0715    PT Stop Time 0758    PT Time Calculation (min) 43 min    Activity Tolerance Patient tolerated treatment well    Behavior During Therapy WFL for tasks assessed/performed               Past Medical History:  Diagnosis Date   BPH (benign prostatic hyperplasia)    Complication of anesthesia    woke up aggressive after hernia surgery   Inguinal hernia    Labral tear of shoulder    Past Surgical History:  Procedure Laterality Date   HERNIA REPAIR     SHOULDER ARTHROSCOPY WITH LABRAL REPAIR Left 05/07/2023   Procedure: ARTHROSCOPY, SHOULDER, WITH GLENOID LABRUM REPAIR;  Surgeon: Wilhelmenia Harada, MD;  [facility];  Service: Orthopedics;  Laterality: Left;   Patient Active Problem List   Diagnosis Date Noted   Instability of left shoulder joint 05/07/2023   Chronic left shoulder pain 03/26/2023   Solitary pulmonary nodule 10/02/2018   Dyspnea 10/02/2018   Scoliosis deformity of spine 09/21/2017    PCP: Alexander Iba, PA  REFERRING PROVIDER: Wilhelmenia Harada, MD  REFERRING DIAG: [phone] (ICD-10-CM) - Instability of left shoulder joint  THERAPY DIAG:  Left shoulder pain, unspecified chronicity  Stiffness of left shoulder, not elsewhere classified  Muscle weakness (generalized)  Pain in thoracic spine  Rationale for Evaluation and Treatment: Rehabilitation  ONSET DATE: L shoulder labral repair 05/07/23  SUBJECTIVE:                                                                                                                                                                                      SUBJECTIVE  STATEMENT: 4/23 The patient reports he is sore this morning. He thinks he slept funny. Overall he is doing well.    Hand dominance: Right  PERTINENT HISTORY: BPH, L labral repair DOS 05/07/23  PAIN:  Are you having pain: 5-6/10 Location/description: L anterolateral shoulder  Best-worst over past week: 1-7/10  - aggravating factors: general movement/activity  - Easing factors: medication, icing    PRECAUTIONS: L shoulder Bokshan labral repair protocol  RED FLAGS: None   WEIGHT BEARING RESTRICTIONS: Yes NWB  FALLS:  Has patient fallen in last 6 months? No  LIVING ENVIRONMENT: 1 story  home + basement w/ spouse who works  Pt usually does most of cooking, otherwise split housework Has two dogs, beagle and mini aussie   OCCUPATION: Attorney full time  PLOF: Independent - enjoys MMA, going to gym, walk dogs  PATIENT GOALS: get back to PLOF, full mobility, get back in gym   NEXT MD VISIT: 05/29/23  OBJECTIVE:  Note: Objective measures were completed at Evaluation unless otherwise noted.  DIAGNOSTIC FINDINGS:  DOS 05/07/23 labral repair L shoulder  PATIENT SURVEYS:  Quick Dash 75%  COGNITION: Overall cognitive status: Within functional limits for tasks assessed     SENSATION: Denies sensory complaints today  POSTURE: Sling donned ; guarded LUE posture as expected post op  UPPER EXTREMITY ROM:  Passive ROM  Right eval Left eval  Shoulder flexion  P: <25 deg limited by muscle guarding  Shoulder abduction  P: ~30 deg limited by muscle guarding    Shoulder internal rotation    Shoulder external rotation (at side unless otherwise noted)    Elbow flexion    Elbow extension    Wrist flexion    Wrist extension     (Blank rows = not tested) (Key: WFL = within functional limits not formally assessed, * = concordant pain, s = stiffness/stretching sensation, NT = not tested)  Comments:   UPPER EXTREMITY MMT:  MMT Right eval Left eval  Shoulder flexion     Shoulder extension    Shoulder abduction    Shoulder adduction    Shoulder internal rotation    Shoulder external rotation    Middle trapezius    Lower trapezius    Elbow flexion    Elbow extension    Wrist flexion    Wrist extension    Wrist ulnar deviation    Wrist radial deviation    Wrist pronation    Wrist supination    Grip strength (lbs)    (Blank rows = not tested) (Key: WFL = within functional limits not formally assessed, * = concordant pain, s = stiffness/stretching sensation, NT = not tested)  Comments: deferred on eval given post op protocol and acuity of surgery   PALPATION/OBSERVATION:  3 portal incisions (anterior, lateral, and posterior) appear grossly WNL, mild bleeding apparent anterior incision but not excessive. No significant erythema or swelling apparent, no excessive warmth.  Able to doff sling w/o assist, inc time/effort and guarding noted of LUE Min assist to don sling Able to perform upper body dressing for wound inspection w/o assist, inc time/effort noted and guarding LUE                                                                                                                              TREATMENT DATE:  Treatment                            4/23: Blank lines following charge title = not provided on  this treatment date.   Manual:  Grade II and III inferior and posterior glides PROM into flexion and ER with distraction following protocol guidelines  Trigger point release to upper trap and scapular area  Use of Thera cane for self soft tissue mobilization.  There-ex:           Chest press 3x10 1 lb bar mild pain at first but improved motion as he practiced Active assistive ER 3 x 10 with  Seated self flexion in low range 3 x 10 Table slide to limited motion 3 x 10 Reviewed scapular retraction for home There-Act:  Self Care:  Nuro-Re-ed:  Gait Training:  Treatment                            4/15: Blank lines following charge  title = not provided on this treatment date.   Manual:  TPDN No STM pecs, subscap, infraspintaus, levator, upper trap PROM within protocol limits- easily able to achieve goals AP GHJ mobs at 30 deg abd There-ex:  There-Act:  Self Care:  Nuro-Re-ed:  Gait Training:   [facility] Adult PT Treatment:                                                DATE: 05/16/23 Therapeutic Exercise: L Elbow flex/ext AAROM L wrist supination/pronation AAROM Beginner pendulum   Manual Therapy: PROM of L shoulder into flexion (~45), abdct (~25), ER, IR -- within rehab protocol restrictions and pt tolerance STM to L cervical paraspinals, L levator and upper trap to decrease fascial restrictions Gentle STM/IASTM to distal-mid biceps brachii   Self Care: Education on rehab protocol, continuing icing regimen, wound inspection + replacing dressing w/ gauze/tegaderm     [facility] Adult PT Treatment:                                                DATE: 05/09/23 Therapeutic Exercise: Elbow flex/ext AAROM, wrist supination/pronation AAROM, towel gripping practice reps, HEP handout  Self Care: Education on sling use, phase one of rehab protocol, surgical restrictions, monitoring surgical site for complications and communication w/ provider as indicated, continuing icing regimen, wound inspection + replacing dressing w/ gauze/tegaderm      PATIENT EDUCATION: Education details: Pt education on PT impairments, prognosis, and POC. Informed consent. Rationale for interventions, safe/appropriate HEP performance, post op precautions and self care as above Person educated: Patient Education method: Explanation, Demonstration, Tactile cues, Verbal cues Education comprehension: verbalized understanding, returned demonstration, verbal cues required, tactile cues required, and needs further education    HOME EXERCISE PROGRAM: Access Code: [email or web address] URL: [email or web address] Date: 05/09/2023 Prepared by: Mayme Spearman  Exercises - Seated Elbow Flexion AAROM  - 2-3 x daily - 7 x weekly - 1 sets - 10 reps - Seated AAROM Elbow Supination/Pronation with Clasped Hands  - 2-3 x daily - 7 x weekly - 1 sets - 10 reps - Seated Gripping Towel  - 2-3 x daily - 7 x weekly - 1 sets - 10 reps  ASSESSMENT:  CLINICAL IMPRESSION: 4/23 Patient is progressing well.  He is reaching protocol and ranges at this time without resistance.  He had mild pain with active  assisted exercises today.  He had improved movement as he practiced.  He was given an updated HEP.  He will see the MD today.  He is advised that he takes him out of the sling to use his arm functionally within pain-free ranges.  He we also reviewed self soft tissue mobilization using a Thera cane.   OBJECTIVE IMPAIRMENTS: decreased activity tolerance, decreased mobility, decreased ROM, decreased strength, impaired perceived functional ability, impaired UE functional use, postural dysfunction, and pain.   ACTIVITY LIMITATIONS: carrying, lifting, sleeping, bathing, dressing, reach over head, and hygiene/grooming  PARTICIPATION LIMITATIONS: meal prep, cleaning, laundry, driving, community activity, and occupation  PERSONAL FACTORS: Age and Time since onset of injury/illness/exacerbation are also affecting patient's functional outcome.   REHAB POTENTIAL: Good  CLINICAL DECISION MAKING: Stable/uncomplicated  EVALUATION COMPLEXITY: Low   GOALS:   SHORT TERM GOALS: Target date: 06/20/2023   Pt will demonstrate appropriate understanding and performance of initially prescribed HEP in order to facilitate improved independence with management of symptoms.  Baseline: HEP established  Goal status: INITIAL   2. Pt will report at least 25% improvement in overall pain levels over past week in order to facilitate improved tolerance to typical daily activities.   Baseline: 1-7/10  Goal status: INITIAL    LONG TERM GOALS: Target date: 08/01/2023 Pt will score  less than or equal to 40% on Quick DASH in order to indicate reduced levels of disability due to shoulder pain (MDC 16-20pts).  Baseline: 75%   Goal status: INITIAL  2.  Pt will demonstrate at least 150 degrees of active shoulder elevation on surgical limb in order to demonstrate improved tolerance to functional movement patterns such as reaching overhead.  Baseline: see ROM chart above Goal status: INITIAL  3.  Pt will demonstrate at least 4+/5 shoulder flex/abd MMT for improved symmetry of UE strength and improved tolerance to functional movements.  Baseline: deferred on eval given surgical protocol Goal status: INITIAL  4. Pt will report ability to perform upper body dressing w/ less than 2 pt increase in pain in order to facilitate improved tolerance to ADLs.  Baseline: 1-7/10 pain with usual activities, inc time/difficulty performing in clinic  Goal status: INITIAL   5. Pt will report at least 50% decrease in overall pain levels in past week in order to facilitate improved tolerance to basic ADLs/mobility.   Baseline: 1-7/10  Goal status: INITIAL   6. Pt will endorse ability to perform usual household tasks with no more than 2 pt increase in pain in order to return to PLOF.   Baseline: housework limited as expected post op Goal status: INITIAL   PLAN:  PT FREQUENCY: 1-2x/week  PT DURATION: 12 weeks  PLANNED INTERVENTIONS: 97164- PT Re-evaluation, 97110-Therapeutic exercises, 97530- Therapeutic activity, 97112- Neuromuscular re-education, 97535- Self Care, 16109- Manual therapy, G0283- Electrical stimulation (unattended), Patient/Family education, Taping, Dry Needling, Joint mobilization, Spinal mobilization, Scar mobilization, Cryotherapy, and Moist heat  PLAN FOR NEXT SESSION: continue to progress as able/appropriate per [facility] shoulder labral repair protocol  Katheran Palms SPT 05/29/23 2:24 PM  [facility] [address] [place], [place], [address] Phone: [phone]   Fax:  [phone]

## 2023-05-29 NOTE — Progress Notes (Signed)
 Post Operative Evaluation    Procedure/Date of Surgery: Left shoulder arthroscopy with labral repair 4/1  Interval History:  Presents today 2 weeks status post the above procedure.  Overall he is doing extremely well.  Has been working with physical therapy.  Denies any significant pain   PMH/PSH/Family History/Social History/Meds/Allergies:    Past Medical History:  Diagnosis Date   BPH (benign prostatic hyperplasia)    Complication of anesthesia    woke up aggressive after hernia surgery   Inguinal hernia    Labral tear of shoulder    Past Surgical History:  Procedure Laterality Date   HERNIA REPAIR     SHOULDER ARTHROSCOPY WITH LABRAL REPAIR Left 05/07/2023   Procedure: ARTHROSCOPY, SHOULDER, WITH GLENOID LABRUM REPAIR;  Surgeon: Wilhelmenia Harada, MD;  Location: [facility];  Service: Orthopedics;  Laterality: Left;   Social History   Socioeconomic History   Marital status: Married    Spouse name: Carolynne Citron   Number of children: Not on file   Years of education: Not on file   Highest education level: Not on file  Occupational History   Not on file  Tobacco Use   Smoking status: Former    Current packs/day: 0.00    Average packs/day: 0.5 packs/day for 0.7 years (0.4 ttl pk-yrs)    Types: Cigarettes    Start date: 06/30/2017    Quit date: 03/31/2018    Years since quitting: 5.1   Smokeless tobacco: Never  Vaping Use   Vaping status: Never Used  Substance and Sexual Activity   Alcohol use: Yes    Comment: 6/wk Beer and wine   Drug use: Never   Sexual activity: Yes  Other Topics Concern   Not on file  Social History Narrative   Not on file   Social Drivers of Health   Financial Resource Strain: Not on file  Food Insecurity: Not on file  Transportation Needs: Not on file  Physical Activity: Not on file  Stress: Not on file  Social Connections: Not on file   Family History  Problem Relation Age of Onset    Depression Sister        Manic Depression   Heart disease Brother    Liver cancer Paternal Grandmother        ?lung cancer   Liver cancer Paternal Grandfather    No Known Allergies Current Outpatient Medications  Medication Sig Dispense Refill   Amphetamine ER (ADZENYS XR-ODT) 18.8 MG TBED Take by mouth daily.     Biotin 1 MG CAPS Take by mouth.     gabapentin  (NEURONTIN ) 300 MG capsule Take 1 capsule (300 mg total) by mouth 3 (three) times daily as needed. 90 capsule 3   Glucosamine-Chondroitin (GLUCOSAMINE CHONDR COMPLEX PO) Take 1 tablet by mouth daily in the afternoon.     meloxicam  (MOBIC ) 15 MG tablet TAKE 1 TABLET(15 MG) BY MOUTH DAILY 30 tablet 0   Multiple Vitamin (MULTIVITAMIN) tablet Take 1 tablet by mouth daily.     oxyCODONE  (ROXICODONE ) 5 MG immediate release tablet Take 1 tablet (5 mg total) by mouth every 4 (four) hours as needed for severe pain (pain score 7-10) or breakthrough pain. 20 tablet 0   tamsulosin (FLOMAX) 0.4 MG CAPS capsule Take 0.4 mg by mouth.     tiZANidine  (ZANAFLEX ) 4  MG tablet TAKE 1 TABLET(4 MG) BY MOUTH EVERY 6 HOURS AS NEEDED FOR MUSCLE SPASMS 30 tablet 1   No current facility-administered medications for this visit.   No results found.  Review of Systems:   A ROS was performed including pertinent positives and negatives as documented in the HPI.   Musculoskeletal Exam:    There were no vitals taken for this visit.  Left shoulder is well-appearing.  Incisions are without redness or erythema.  In the spine position he can forward elevate to 90 degrees with external rotation at the side to 30 degrees.  Internal rotation deferred today distal neurosensory exam is intact  Imaging:      I personally reviewed and interpreted the radiographs.   Assessment:   2 weeks status post left shoulder arthroscopic labral repair.  At this point I do believe he be a candidate for early active range of motion at the 4-week protocol.  I will plan to see him  back in 1 month for reassessment  Plan :    - Return to clinic 1 month for reassessment      I personally saw and evaluated the patient, and participated in the management and treatment plan.  Wilhelmenia Harada, MD Attending Physician, Orthopedic Surgery  This document was dictated using Dragon voice recognition software. A reasonable attempt at proof reading has been made to minimize errors.

## 2023-06-04 ENCOUNTER — Encounter (HOSPITAL_BASED_OUTPATIENT_CLINIC_OR_DEPARTMENT_OTHER): Payer: Self-pay | Admitting: Physical Therapy

## 2023-06-04 ENCOUNTER — Ambulatory Visit (HOSPITAL_BASED_OUTPATIENT_CLINIC_OR_DEPARTMENT_OTHER): Admitting: Physical Therapy

## 2023-06-04 DIAGNOSIS — M6281 Muscle weakness (generalized): Secondary | ICD-10-CM | POA: Diagnosis not present

## 2023-06-04 DIAGNOSIS — M25512 Pain in left shoulder: Secondary | ICD-10-CM

## 2023-06-04 DIAGNOSIS — M25312 Other instability, left shoulder: Secondary | ICD-10-CM | POA: Diagnosis not present

## 2023-06-04 DIAGNOSIS — M25612 Stiffness of left shoulder, not elsewhere classified: Secondary | ICD-10-CM

## 2023-06-04 DIAGNOSIS — M546 Pain in thoracic spine: Secondary | ICD-10-CM | POA: Diagnosis not present

## 2023-06-04 NOTE — Therapy (Signed)
 OUTPATIENT PHYSICAL THERAPY SHOULDER TREATMENT   Patient Name: Seth Shields MRN: [id number] DOB:1990/12/15, 33 y.o., male Today's Date: 06/04/2023  END OF SESSION:  PT End of Session - 06/04/23 0718     Visit Number 5    Number of Visits 25    Date for PT Re-Evaluation 08/01/23    Authorization Type BCBS    PT Start Time 0717    PT Stop Time 0759    PT Time Calculation (min) 42 min    Activity Tolerance Patient tolerated treatment well    Behavior During Therapy WFL for tasks assessed/performed               Past Medical History:  Diagnosis Date   BPH (benign prostatic hyperplasia)    Complication of anesthesia    woke up aggressive after hernia surgery   Inguinal hernia    Labral tear of shoulder    Past Surgical History:  Procedure Laterality Date   HERNIA REPAIR     SHOULDER ARTHROSCOPY WITH LABRAL REPAIR Left 05/07/2023   Procedure: ARTHROSCOPY, SHOULDER, WITH GLENOID LABRUM REPAIR;  Surgeon: Wilhelmenia Harada, MD;  Location: [facility];  Service: Orthopedics;  Laterality: Left;   Patient Active Problem List   Diagnosis Date Noted   Instability of left shoulder joint 05/07/2023   Chronic left shoulder pain 03/26/2023   Solitary pulmonary nodule 10/02/2018   Dyspnea 10/02/2018   Scoliosis deformity of spine 09/21/2017    PCP: Alexander Iba, PA  REFERRING PROVIDER: Wilhelmenia Harada, MD  REFERRING DIAG: [phone] (ICD-10-CM) - Instability of left shoulder joint  THERAPY DIAG:  Left shoulder pain, unspecified chronicity  Stiffness of left shoulder, not elsewhere classified  Muscle weakness (generalized)  Rationale for Evaluation and Treatment: Rehabilitation  ONSET DATE: L shoulder labral repair 05/07/23  SUBJECTIVE:                                                                                                                                                                                      SUBJECTIVE STATEMENT: Shoulder doing well.  HEP going well, feels resistance going into ER position. Improving ROM overall.    Hand dominance: Right  PERTINENT HISTORY: BPH, L labral repair DOS 05/07/23  PAIN:  Are you having pain: 2/10 Location/description: L anterolateral shoulder  Best-worst over past week: 1-7/10  - aggravating factors: general movement/activity  - Easing factors: medication, icing    PRECAUTIONS: L shoulder [place] labral repair protocol  RED FLAGS: None   WEIGHT BEARING RESTRICTIONS: Yes NWB  FALLS:  Has patient fallen in last 6 months? No  LIVING ENVIRONMENT: 1 story home + basement w/ spouse who works  Pt  usually does most of cooking, otherwise split housework Has two dogs, beagle and mini aussie   OCCUPATION: Attorney full time  PLOF: Independent - enjoys MMA, going to gym, walk dogs  PATIENT GOALS: get back to PLOF, full mobility, get back in gym   NEXT MD VISIT: 05/29/23  OBJECTIVE:  Note: Objective measures were completed at Evaluation unless otherwise noted.  DIAGNOSTIC FINDINGS:  DOS 05/07/23 labral repair L shoulder  PATIENT SURVEYS:  Quick Dash 75%  COGNITION: Overall cognitive status: Within functional limits for tasks assessed     SENSATION: Denies sensory complaints today  POSTURE: Sling donned ; guarded LUE posture as expected post op  UPPER EXTREMITY ROM:  Passive ROM  Right eval Left eval  Shoulder flexion  P: <25 deg limited by muscle guarding  Shoulder abduction  P: ~30 deg limited by muscle guarding    Shoulder internal rotation    Shoulder external rotation (at side unless otherwise noted)    Elbow flexion    Elbow extension    Wrist flexion    Wrist extension     (Blank rows = not tested) (Key: WFL = within functional limits not formally assessed, * = concordant pain, s = stiffness/stretching sensation, NT = not tested)  Comments:   UPPER EXTREMITY MMT:  MMT Right eval Left eval  Shoulder flexion    Shoulder extension    Shoulder abduction     Shoulder adduction    Shoulder internal rotation    Shoulder external rotation    Middle trapezius    Lower trapezius    Elbow flexion    Elbow extension    Wrist flexion    Wrist extension    Wrist ulnar deviation    Wrist radial deviation    Wrist pronation    Wrist supination    Grip strength (lbs)    (Blank rows = not tested) (Key: WFL = within functional limits not formally assessed, * = concordant pain, s = stiffness/stretching sensation, NT = not tested)  Comments: deferred on eval given post op protocol and acuity of surgery   PALPATION/OBSERVATION:  3 portal incisions (anterior, lateral, and posterior) appear grossly WNL, mild bleeding apparent anterior incision but not excessive. No significant erythema or swelling apparent, no excessive warmth.  Able to doff sling w/o assist, inc time/effort and guarding noted of LUE Min assist to don sling Able to perform upper body dressing for wound inspection w/o assist, inc time/effort noted and guarding LUE                                                                                                                              TREATMENT DATE:  06/04/23 Manual: Grade II-III inferior glide in progressive flexion, Grade II-III AP glide in progressive ER Supine Shoulder AAROM flexion with cane 2 x 10 Supine shoulder AAROM ER with cane 2 x 10 Standing row RTB 3 x 15 Prone shoulder extension 2 x 10 Sidelying  ER 2 x 10 PROM L shoulder   Treatment                            4/23: Blank lines following charge title = not provided on this treatment date.   Manual:  Grade II and III inferior and posterior glides PROM into flexion and ER with distraction following protocol guidelines  Trigger point release to upper trap and scapular area  Use of Thera cane for self soft tissue mobilization.  There-ex:           Chest press 3x10 1 lb bar mild pain at first but improved motion as he practiced Active assistive ER 3 x 10 with   Seated self flexion in low range 3 x 10 Table slide to limited motion 3 x 10 Reviewed scapular retraction for home There-Act:  Self Care:  Nuro-Re-ed:  Gait Training:  Treatment                            4/15: Blank lines following charge title = not provided on this treatment date.   Manual:  TPDN No STM pecs, subscap, infraspintaus, levator, upper trap PROM within protocol limits- easily able to achieve goals AP GHJ mobs at 30 deg abd There-ex:  There-Act:  Self Care:  Nuro-Re-ed:  Gait Training:   [facility] Adult PT Treatment:                                                DATE: 05/16/23 Therapeutic Exercise: L Elbow flex/ext AAROM L wrist supination/pronation AAROM Beginner pendulum   Manual Therapy: PROM of L shoulder into flexion (~45), abdct (~25), ER, IR -- within rehab protocol restrictions and pt tolerance STM to L cervical paraspinals, L levator and upper trap to decrease fascial restrictions Gentle STM/IASTM to distal-mid biceps brachii   Self Care: Education on rehab protocol, continuing icing regimen, wound inspection + replacing dressing w/ gauze/tegaderm     [facility] Adult PT Treatment:                                                DATE: 05/09/23 Therapeutic Exercise: Elbow flex/ext AAROM, wrist supination/pronation AAROM, towel gripping practice reps, HEP handout  Self Care: Education on sling use, phase one of rehab protocol, surgical restrictions, monitoring surgical site for complications and communication w/ provider as indicated, continuing icing regimen, wound inspection + replacing dressing w/ gauze/tegaderm      PATIENT EDUCATION: Education details: Pt education on PT impairments, prognosis, and POC. Informed consent. Rationale for interventions, safe/appropriate HEP performance, post op precautions and self care as above Person educated: Patient Education method: Explanation, Demonstration, Tactile cues, Verbal cues Education comprehension:  verbalized understanding, returned demonstration, verbal cues required, tactile cues required, and needs further education    HOME EXERCISE PROGRAM: Access Code: [email or web address] URL: [email or web address] Date: 05/09/2023 Prepared by: Mayme Spearman  Exercises - Seated Elbow Flexion AAROM  - 2-3 x daily - 7 x weekly - 1 sets - 10 reps - Seated AAROM Elbow Supination/Pronation with Clasped Hands  - 2-3 x daily - 7 x weekly - 1 sets - 10 reps -  Seated Gripping Towel  - 2-3 x daily - 7 x weekly - 1 sets - 10 reps  ASSESSMENT:  CLINICAL IMPRESSION: Patient with AROM in seated to about 90. Initial restriction with PROM to about 90 which improves significantly with manual. AAROM to 135. Began AAROM and light resisted scap strengthening which are tolerated well. 8-9/10 fatigue at EOS. Patient will continue to benefit from physical therapy in order to improve function and reduce impairment.    OBJECTIVE IMPAIRMENTS: decreased activity tolerance, decreased mobility, decreased ROM, decreased strength, impaired perceived functional ability, impaired UE functional use, postural dysfunction, and pain.   ACTIVITY LIMITATIONS: carrying, lifting, sleeping, bathing, dressing, reach over head, and hygiene/grooming  PARTICIPATION LIMITATIONS: meal prep, cleaning, laundry, driving, community activity, and occupation  PERSONAL FACTORS: Age and Time since onset of injury/illness/exacerbation are also affecting patient's functional outcome.   REHAB POTENTIAL: Good  CLINICAL DECISION MAKING: Stable/uncomplicated  EVALUATION COMPLEXITY: Low   GOALS:   SHORT TERM GOALS: Target date: 06/20/2023   Pt will demonstrate appropriate understanding and performance of initially prescribed HEP in order to facilitate improved independence with management of symptoms.  Baseline: HEP established  Goal status: INITIAL   2. Pt will report at least 25% improvement in overall pain levels over past week in  order to facilitate improved tolerance to typical daily activities.   Baseline: 1-7/10  Goal status: INITIAL    LONG TERM GOALS: Target date: 08/01/2023 Pt will score less than or equal to 40% on Quick DASH in order to indicate reduced levels of disability due to shoulder pain (MDC 16-20pts).  Baseline: 75%   Goal status: INITIAL  2.  Pt will demonstrate at least 150 degrees of active shoulder elevation on surgical limb in order to demonstrate improved tolerance to functional movement patterns such as reaching overhead.  Baseline: see ROM chart above Goal status: INITIAL  3.  Pt will demonstrate at least 4+/5 shoulder flex/abd MMT for improved symmetry of UE strength and improved tolerance to functional movements.  Baseline: deferred on eval given surgical protocol Goal status: INITIAL  4. Pt will report ability to perform upper body dressing w/ less than 2 pt increase in pain in order to facilitate improved tolerance to ADLs.  Baseline: 1-7/10 pain with usual activities, inc time/difficulty performing in clinic  Goal status: INITIAL   5. Pt will report at least 50% decrease in overall pain levels in past week in order to facilitate improved tolerance to basic ADLs/mobility.   Baseline: 1-7/10  Goal status: INITIAL   6. Pt will endorse ability to perform usual household tasks with no more than 2 pt increase in pain in order to return to PLOF.   Baseline: housework limited as expected post op Goal status: INITIAL   PLAN:  PT FREQUENCY: 1-2x/week  PT DURATION: 12 weeks  PLANNED INTERVENTIONS: 97164- PT Re-evaluation, 97110-Therapeutic exercises, 97530- Therapeutic activity, 97112- Neuromuscular re-education, 97535- Self Care, 27253- Manual therapy, [id number]- Electrical stimulation (unattended), Patient/Family education, Taping, Dry Needling, Joint mobilization, Spinal mobilization, Scar mobilization, Cryotherapy, and Moist heat  PLAN FOR NEXT SESSION: continue to progress as  able/appropriate per [facility] shoulder labral repair protocol   Perfecto Bracket Cardale Dorer, PT 06/04/2023, 8:00 AM

## 2023-06-06 ENCOUNTER — Ambulatory Visit (HOSPITAL_BASED_OUTPATIENT_CLINIC_OR_DEPARTMENT_OTHER): Attending: Orthopaedic Surgery | Admitting: Physical Therapy

## 2023-06-06 ENCOUNTER — Encounter (HOSPITAL_BASED_OUTPATIENT_CLINIC_OR_DEPARTMENT_OTHER): Payer: Self-pay | Admitting: Physical Therapy

## 2023-06-06 DIAGNOSIS — M25512 Pain in left shoulder: Secondary | ICD-10-CM | POA: Diagnosis not present

## 2023-06-06 DIAGNOSIS — M25612 Stiffness of left shoulder, not elsewhere classified: Secondary | ICD-10-CM | POA: Diagnosis not present

## 2023-06-06 DIAGNOSIS — M6281 Muscle weakness (generalized): Secondary | ICD-10-CM | POA: Insufficient documentation

## 2023-06-06 NOTE — Therapy (Signed)
 OUTPATIENT PHYSICAL THERAPY SHOULDER TREATMENT   Patient Name: Seth Shields MRN: [id number] DOB:[birth year], 33 y.o., male Today's Date: 06/06/2023  END OF SESSION:  PT End of Session - 06/06/23 0719     Visit Number 6    Number of Visits 25    Date for PT Re-Evaluation 08/01/23    Authorization Type BCBS    PT Start Time 0718    PT Stop Time 0758    PT Time Calculation (min) 40 min    Activity Tolerance Patient tolerated treatment well    Behavior During Therapy WFL for tasks assessed/performed               Past Medical History:  Diagnosis Date   BPH (benign prostatic hyperplasia)    Complication of anesthesia    woke up aggressive after hernia surgery   Inguinal hernia    Labral tear of shoulder    Past Surgical History:  Procedure Laterality Date   HERNIA REPAIR     SHOULDER ARTHROSCOPY WITH LABRAL REPAIR Left 05/07/2023   Procedure: ARTHROSCOPY, SHOULDER, WITH GLENOID LABRUM REPAIR;  Surgeon: Wilhelmenia Harada, MD;  Location: [facility];  Service: Orthopedics;  Laterality: Left;   Patient Active Problem List   Diagnosis Date Noted   Instability of left shoulder joint 05/07/2023   Chronic left shoulder pain 03/26/2023   Solitary pulmonary nodule 10/02/2018   Dyspnea 10/02/2018   Scoliosis deformity of spine 09/21/2017    PCP: Alexander Iba, PA  REFERRING PROVIDER: Wilhelmenia Harada, MD  REFERRING DIAG: [phone] (ICD-10-CM) - Instability of left shoulder joint  THERAPY DIAG:  Left shoulder pain, unspecified chronicity  Stiffness of left shoulder, not elsewhere classified  Muscle weakness (generalized)  Rationale for Evaluation and Treatment: Rehabilitation  ONSET DATE: L shoulder labral repair 05/07/23  SUBJECTIVE:                                                                                                                                                                                      SUBJECTIVE STATEMENT: Patient states  shoulder feels stiff in the morning. Pain has been good. Sore with ER/IR toward end ranges. Sore after last session in a good way.    Hand dominance: Right  PERTINENT HISTORY: BPH, L labral repair DOS 05/07/23  PAIN:  Are you having pain: 2/10 stiff Location/description: L anterolateral shoulder  Best-worst over past week: 1-7/10  - aggravating factors: general movement/activity  - Easing factors: medication, icing    PRECAUTIONS: L shoulder [facility] labral repair protocol  RED FLAGS: None   WEIGHT BEARING RESTRICTIONS: Yes NWB  FALLS:  Has patient fallen in last 6 months? No  LIVING  ENVIRONMENT: 1 story home + basement w/ spouse who works  Pt usually does most of cooking, otherwise split housework Has two dogs, beagle and mini aussie   OCCUPATION: Attorney full time  PLOF: Independent - enjoys MMA, going to gym, walk dogs  PATIENT GOALS: get back to PLOF, full mobility, get back in gym   NEXT MD VISIT: 05/29/23  OBJECTIVE:  Note: Objective measures were completed at Evaluation unless otherwise noted.  DIAGNOSTIC FINDINGS:  DOS 05/07/23 labral repair L shoulder  PATIENT SURVEYS:  Quick Dash 75%  COGNITION: Overall cognitive status: Within functional limits for tasks assessed     SENSATION: Denies sensory complaints today  POSTURE: Sling donned ; guarded LUE posture as expected post op  UPPER EXTREMITY ROM:  Passive ROM  Right eval Left eval Left AROM 06/06/23  Shoulder flexion  P: <25 deg limited by muscle guarding 130 improves to 150 154 AAROM  Shoulder abduction  P: ~30 deg limited by muscle guarding     Shoulder internal rotation     Shoulder external rotation (at side unless otherwise noted)     Elbow flexion     Elbow extension     Wrist flexion     Wrist extension      (Blank rows = not tested) (Key: WFL = within functional limits not formally assessed, * = concordant pain, s = stiffness/stretching sensation, NT = not tested)  Comments:    UPPER EXTREMITY MMT:  MMT Right eval Left eval  Shoulder flexion    Shoulder extension    Shoulder abduction    Shoulder adduction    Shoulder internal rotation    Shoulder external rotation    Middle trapezius    Lower trapezius    Elbow flexion    Elbow extension    Wrist flexion    Wrist extension    Wrist ulnar deviation    Wrist radial deviation    Wrist pronation    Wrist supination    Grip strength (lbs)    (Blank rows = not tested) (Key: WFL = within functional limits not formally assessed, * = concordant pain, s = stiffness/stretching sensation, NT = not tested)  Comments: deferred on eval given post op protocol and acuity of surgery   PALPATION/OBSERVATION:  3 portal incisions (anterior, lateral, and posterior) appear grossly WNL, mild bleeding apparent anterior incision but not excessive. No significant erythema or swelling apparent, no excessive warmth.  Able to doff sling w/o assist, inc time/effort and guarding noted of LUE Min assist to don sling Able to perform upper body dressing for wound inspection w/o assist, inc time/effort noted and guarding LUE                                                                                                                              TREATMENT DATE:  06/06/23 Manual: Grade II-III inferior glide in progressive flexion and abduction, Grade II-III AP glide in progressive ER Supine  Shoulder AAROM flexion with cane 2 x 10 Pulley flexion 2 minutes, scaption 2 minutes Prone shoulder extension 2 x 10 Prone shoulder horizontal abduction 2 x 10 Sidelying ER 2 x 10 Standing row RTB 2 x 15 Standing shoulder extension RTB 2 x 10  06/04/23 Manual: Grade II-III inferior glide in progressive flexion, Grade II-III AP glide in progressive ER Supine Shoulder AAROM flexion with cane 2 x 10 Supine shoulder AAROM ER with cane 2 x 10 Standing row RTB 3 x 15 Prone shoulder extension 2 x 10 Sidelying ER 2 x 10 PROM L  shoulder   Treatment                            4/23: Blank lines following charge title = not provided on this treatment date.   Manual:  Grade II and III inferior and posterior glides PROM into flexion and ER with distraction following protocol guidelines  Trigger point release to upper trap and scapular area  Use of Thera cane for self soft tissue mobilization.  There-ex:           Chest press 3x10 1 lb bar mild pain at first but improved motion as he practiced Active assistive ER 3 x 10 with  Seated self flexion in low range 3 x 10 Table slide to limited motion 3 x 10 Reviewed scapular retraction for home There-Act:  Self Care:  Nuro-Re-ed:  Gait Training:  Treatment                            4/15: Blank lines following charge title = not provided on this treatment date.   Manual:  TPDN No STM pecs, subscap, infraspintaus, levator, upper trap PROM within protocol limits- easily able to achieve goals AP GHJ mobs at 30 deg abd There-ex:  There-Act:  Self Care:  Nuro-Re-ed:  Gait Training:   [facility] Adult PT Treatment:                                                DATE: 05/16/23 Therapeutic Exercise: L Elbow flex/ext AAROM L wrist supination/pronation AAROM Beginner pendulum   Manual Therapy: PROM of L shoulder into flexion (~45), abdct (~25), ER, IR -- within rehab protocol restrictions and pt tolerance STM to L cervical paraspinals, L levator and upper trap to decrease fascial restrictions Gentle STM/IASTM to distal-mid biceps brachii   Self Care: Education on rehab protocol, continuing icing regimen, wound inspection + replacing dressing w/ gauze/tegaderm     [facility] Adult PT Treatment:                                                DATE: 05/09/23 Therapeutic Exercise: Elbow flex/ext AAROM, wrist supination/pronation AAROM, towel gripping practice reps, HEP handout  Self Care: Education on sling use, phase one of rehab protocol, surgical restrictions,  monitoring surgical site for complications and communication w/ provider as indicated, continuing icing regimen, wound inspection + replacing dressing w/ gauze/tegaderm      PATIENT EDUCATION: Education details: Pt education on PT impairments, prognosis, and POC. Informed consent. Rationale for interventions, safe/appropriate HEP performance, post op precautions and  self care as above Person educated: Patient Education method: Explanation, Demonstration, Tactile cues, Verbal cues Education comprehension: verbalized understanding, returned demonstration, verbal cues required, tactile cues required, and needs further education    HOME EXERCISE PROGRAM: Access Code: [email or web address] URL: [email or web address] Date: 05/09/2023 Prepared by: Mayme Spearman  Exercises - Seated Elbow Flexion AAROM  - 2-3 x daily - 7 x weekly - 1 sets - 10 reps - Seated AAROM Elbow Supination/Pronation with Clasped Hands  - 2-3 x daily - 7 x weekly - 1 sets - 10 reps - Seated Gripping Towel  - 2-3 x daily - 7 x weekly - 1 sets - 10 reps  ASSESSMENT:  CLINICAL IMPRESSION: Patient with AROM to 130 today which further improves to 150 with manual although it was sore past 90. AAROM improving as well. AROM to 156 with exercise. Continued with periscap strengthening which is tolerated well. RPE 6-7/10 with prone series. Trial of flexion in prone series with limited ROM, discontinued for today. Patient will continue to benefit from physical therapy in order to improve function and reduce impairment.    OBJECTIVE IMPAIRMENTS: decreased activity tolerance, decreased mobility, decreased ROM, decreased strength, impaired perceived functional ability, impaired UE functional use, postural dysfunction, and pain.   ACTIVITY LIMITATIONS: carrying, lifting, sleeping, bathing, dressing, reach over head, and hygiene/grooming  PARTICIPATION LIMITATIONS: meal prep, cleaning, laundry, driving, community activity, and  occupation  PERSONAL FACTORS: Age and Time since onset of injury/illness/exacerbation are also affecting patient's functional outcome.   REHAB POTENTIAL: Good  CLINICAL DECISION MAKING: Stable/uncomplicated  EVALUATION COMPLEXITY: Low   GOALS:   SHORT TERM GOALS: Target date: 06/20/2023   Pt will demonstrate appropriate understanding and performance of initially prescribed HEP in order to facilitate improved independence with management of symptoms.  Baseline: HEP established  Goal status: INITIAL   2. Pt will report at least 25% improvement in overall pain levels over past week in order to facilitate improved tolerance to typical daily activities.   Baseline: 1-7/10  Goal status: INITIAL    LONG TERM GOALS: Target date: 08/01/2023 Pt will score less than or equal to 40% on Quick DASH in order to indicate reduced levels of disability due to shoulder pain (MDC 16-20pts).  Baseline: 75%   Goal status: INITIAL  2.  Pt will demonstrate at least 150 degrees of active shoulder elevation on surgical limb in order to demonstrate improved tolerance to functional movement patterns such as reaching overhead.  Baseline: see ROM chart above Goal status: INITIAL  3.  Pt will demonstrate at least 4+/5 shoulder flex/abd MMT for improved symmetry of UE strength and improved tolerance to functional movements.  Baseline: deferred on eval given surgical protocol Goal status: INITIAL  4. Pt will report ability to perform upper body dressing w/ less than 2 pt increase in pain in order to facilitate improved tolerance to ADLs.  Baseline: 1-7/10 pain with usual activities, inc time/difficulty performing in clinic  Goal status: INITIAL   5. Pt will report at least 50% decrease in overall pain levels in past week in order to facilitate improved tolerance to basic ADLs/mobility.   Baseline: 1-7/10  Goal status: INITIAL   6. Pt will endorse ability to perform usual household tasks with no more than 2  pt increase in pain in order to return to PLOF.   Baseline: housework limited as expected post op Goal status: INITIAL   PLAN:  PT FREQUENCY: 1-2x/week  PT DURATION: 12 weeks  PLANNED INTERVENTIONS: 97164- PT Re-evaluation, 97110-Therapeutic  exercises, 97530- Therapeutic activity, [id number]- Neuromuscular re-education, [phone]- Self Care, 40981- Manual therapy, [id number]- Electrical stimulation (unattended), Patient/Family education, Taping, Dry Needling, Joint mobilization, Spinal mobilization, Scar mobilization, Cryotherapy, and Moist heat  PLAN FOR NEXT SESSION: continue to progress as able/appropriate per [facility] shoulder labral repair protocol   Perfecto Bracket Andree Heeg, PT 06/06/2023, 7:19 AM

## 2023-06-12 ENCOUNTER — Encounter (HOSPITAL_BASED_OUTPATIENT_CLINIC_OR_DEPARTMENT_OTHER): Payer: Self-pay | Admitting: Physical Therapy

## 2023-06-12 ENCOUNTER — Ambulatory Visit (HOSPITAL_BASED_OUTPATIENT_CLINIC_OR_DEPARTMENT_OTHER): Admitting: Physical Therapy

## 2023-06-12 DIAGNOSIS — M25612 Stiffness of left shoulder, not elsewhere classified: Secondary | ICD-10-CM

## 2023-06-12 DIAGNOSIS — M6281 Muscle weakness (generalized): Secondary | ICD-10-CM | POA: Diagnosis not present

## 2023-06-12 DIAGNOSIS — M25512 Pain in left shoulder: Secondary | ICD-10-CM | POA: Diagnosis not present

## 2023-06-12 NOTE — Therapy (Signed)
 OUTPATIENT PHYSICAL THERAPY SHOULDER TREATMENT   Patient Name: Seth Shields MRN: [id number] DOB:[birth year], 33 y.o., male Today's Date: 06/12/2023  END OF SESSION:  PT End of Session - 06/12/23 0727     Visit Number 7    Number of Visits 25    Date for PT Re-Evaluation 08/01/23    Authorization Type BC[employer]    PT Start Time 0725   arrives late   PT Stop Time 0758    PT Time Calculation (min) 33 min    Activity Tolerance Patient tolerated treatment well    Behavior During Therapy WFL for tasks assessed/performed               Past Medical History:  Diagnosis Date   BPH (benign prostatic hyperplasia)    Complication of anesthesia    woke up aggressive after hernia surgery   Inguinal hernia    Labral tear of shoulder    Past Surgical History:  Procedure Laterality Date   HERNIA REPAIR     SHOULDER ARTHROSCOPY WITH LABRAL REPAIR Left 05/07/2023   Procedure: ARTHROSCOPY, SHOULDER, WITH GLENOID LABRUM REPAIR;  Surgeon: Wilhelmenia Harada, MD;  Location: [facility];  Service: Orthopedics;  Laterality: Left;   Patient Active Problem List   Diagnosis Date Noted   Instability of left shoulder joint 05/07/2023   Chronic left shoulder pain 03/26/2023   Solitary pulmonary nodule 10/02/2018   Dyspnea 10/02/2018   Scoliosis deformity of spine 09/21/2017    PCP: Alexander Iba, PA  REFERRING PROVIDER: Wilhelmenia Harada, MD  REFERRING DIAG: [phone] (ICD-10-CM) - Instability of left shoulder joint  THERAPY DIAG:  Left shoulder pain, unspecified chronicity  Stiffness of left shoulder, not elsewhere classified  Muscle weakness (generalized)  Rationale for Evaluation and Treatment: Rehabilitation  ONSET DATE: L shoulder labral repair 05/07/23  SUBJECTIVE:                                                                                                                                                                                      SUBJECTIVE STATEMENT: Patient  states shoulder has been doing well. Didn't get to prone exercises much. Neck is sore today when he woke up and on R side.    Hand dominance: Right  PERTINENT HISTORY: BPH, L labral repair DOS 05/07/23  PAIN:  Are you having pain: 2/10 stiff Location/description: L anterolateral shoulder  Best-worst over past week: 1-7/10  - aggravating factors: general movement/activity  - Easing factors: medication, icing    PRECAUTIONS: L shoulder [facility] labral repair protocol  RED FLAGS: None   WEIGHT BEARING RESTRICTIONS: Yes NWB  FALLS:  Has patient fallen in last 6 months? No  LIVING ENVIRONMENT: 1 story home + basement w/ spouse who works  Pt usually does most of cooking, otherwise split housework Has two dogs, beagle and mini aussie   OCCUPATION: Attorney full time  PLOF: Independent - enjoys MMA, going to gym, walk dogs  PATIENT GOALS: get back to PLOF, full mobility, get back in gym   NEXT MD VISIT: 05/29/23  OBJECTIVE:  Note: Objective measures were completed at Evaluation unless otherwise noted.  DIAGNOSTIC FINDINGS:  DOS 05/07/23 labral repair L shoulder  PATIENT SURVEYS:  Quick Dash 75%  COGNITION: Overall cognitive status: Within functional limits for tasks assessed     SENSATION: Denies sensory complaints today  POSTURE: Sling donned ; guarded LUE posture as expected post op  UPPER EXTREMITY ROM:  Passive ROM  Right eval Left eval Left AROM 06/06/23  Shoulder flexion  P: <25 deg limited by muscle guarding 130 improves to 150 154 AAROM  Shoulder abduction  P: ~30 deg limited by muscle guarding     Shoulder internal rotation     Shoulder external rotation (at side unless otherwise noted)     Elbow flexion     Elbow extension     Wrist flexion     Wrist extension      (Blank rows = not tested) (Key: WFL = within functional limits not formally assessed, * = concordant pain, s = stiffness/stretching sensation, NT = not tested)  Comments:   UPPER  EXTREMITY MMT:  MMT Right eval Left eval  Shoulder flexion    Shoulder extension    Shoulder abduction    Shoulder adduction    Shoulder internal rotation    Shoulder external rotation    Middle trapezius    Lower trapezius    Elbow flexion    Elbow extension    Wrist flexion    Wrist extension    Wrist ulnar deviation    Wrist radial deviation    Wrist pronation    Wrist supination    Grip strength (lbs)    (Blank rows = not tested) (Key: WFL = within functional limits not formally assessed, * = concordant pain, s = stiffness/stretching sensation, NT = not tested)  Comments: deferred on eval given post op protocol and acuity of surgery   PALPATION/OBSERVATION:  3 portal incisions (anterior, lateral, and posterior) appear grossly WNL, mild bleeding apparent anterior incision but not excessive. No significant erythema or swelling apparent, no excessive warmth.  Able to doff sling w/o assist, inc time/effort and guarding noted of LUE Min assist to don sling Able to perform upper body dressing for wound inspection w/o assist, inc time/effort noted and guarding LUE                                                                                                                              TREATMENT DATE:  06/12/23 Manual:STM to R UT, manual UT stretch;  Grade II-III inferior glide in progressive flexion and abduction,  pin and stretch L lat Supine AAROM flexion 2 x 10 Supine Shoulder AAROM flexion with 2# bar 2 x 10 Bent shoulder extension 1# 2 x 10 Bent shoulder horizontal abduction 1# 2 x 10 Bent row 1# 2 x 10  06/06/23 Manual: Grade II-III inferior glide in progressive flexion and abduction, Grade II-III AP glide in progressive ER Supine Shoulder AAROM flexion with cane 2 x 10 Pulley flexion 2 minutes, scaption 2 minutes Prone shoulder extension 2 x 10 Prone shoulder horizontal abduction 2 x 10 Sidelying ER 2 x 10 Standing row RTB 2 x 15 Standing shoulder extension RTB 2  x 10  06/04/23 Manual: Grade II-III inferior glide in progressive flexion, Grade II-III AP glide in progressive ER Supine Shoulder AAROM flexion with cane 2 x 10 Supine shoulder AAROM ER with cane 2 x 10 Standing row RTB 3 x 15 Prone shoulder extension 2 x 10 Sidelying ER 2 x 10 PROM L shoulder   Treatment                            4/23: Blank lines following charge title = not provided on this treatment date.   Manual:  Grade II and III inferior and posterior glides PROM into flexion and ER with distraction following protocol guidelines  Trigger point release to upper trap and scapular area  Use of Thera cane for self soft tissue mobilization.  There-ex:           Chest press 3x10 1 lb bar mild pain at first but improved motion as he practiced Active assistive ER 3 x 10 with  Seated self flexion in low range 3 x 10 Table slide to limited motion 3 x 10 Reviewed scapular retraction for home There-Act:  Self Care:  Nuro-Re-ed:  Gait Training:  Treatment                            4/15: Blank lines following charge title = not provided on this treatment date.   Manual:  TPDN No STM pecs, subscap, infraspintaus, levator, upper trap PROM within protocol limits- easily able to achieve goals AP GHJ mobs at 30 deg abd There-ex:  There-Act:  Self Care:  Nuro-Re-ed:  Gait Training:   [facility] Adult PT Treatment:                                                DATE: 05/16/23 Therapeutic Exercise: L Elbow flex/ext AAROM L wrist supination/pronation AAROM Beginner pendulum   Manual Therapy: PROM of L shoulder into flexion (~45), abdct (~25), ER, IR -- within rehab protocol restrictions and pt tolerance STM to L cervical paraspinals, L levator and upper trap to decrease fascial restrictions Gentle STM/IASTM to distal-mid biceps brachii   Self Care: Education on rehab protocol, continuing icing regimen, wound inspection + replacing dressing w/ gauze/tegaderm     [facility]  Adult PT Treatment:                                                DATE: 05/09/23 Therapeutic Exercise: Elbow flex/ext AAROM, wrist supination/pronation AAROM, towel gripping practice reps, HEP handout  Self  Care: Education on sling use, phase one of rehab protocol, surgical restrictions, monitoring surgical site for complications and communication w/ provider as indicated, continuing icing regimen, wound inspection + replacing dressing w/ gauze/tegaderm      PATIENT EDUCATION: Education details: Pt education on PT impairments, prognosis, and POC. Informed consent. Rationale for interventions, safe/appropriate HEP performance, post op precautions and self care as above Person educated: Patient Education method: Explanation, Demonstration, Tactile cues, Verbal cues Education comprehension: verbalized understanding, returned demonstration, verbal cues required, tactile cues required, and needs further education    HOME EXERCISE PROGRAM: Access Code: [email or web address] URL: [email or web address] Date: 05/09/2023 Prepared by: Mayme Spearman  Exercises - Seated Elbow Flexion AAROM  - 2-3 x daily - 7 x weekly - 1 sets - 10 reps - Seated AAROM Elbow Supination/Pronation with Clasped Hands  - 2-3 x daily - 7 x weekly - 1 sets - 10 reps - Seated Gripping Towel  - 2-3 x daily - 7 x weekly - 1 sets - 10 reps  ASSESSMENT:  CLINICAL IMPRESSION: Patient with R UT symptoms  which improve with manual. L shoulder stiffness with increased lat tension. ROM and symptoms improve with manual. Performed prone series in bent position so patient can perform more frequently while at work. Patient will continue to benefit from physical therapy in order to improve function and reduce impairment.    OBJECTIVE IMPAIRMENTS: decreased activity tolerance, decreased mobility, decreased ROM, decreased strength, impaired perceived functional ability, impaired UE functional use, postural dysfunction, and pain.    ACTIVITY LIMITATIONS: carrying, lifting, sleeping, bathing, dressing, reach over head, and hygiene/grooming  PARTICIPATION LIMITATIONS: meal prep, cleaning, laundry, driving, community activity, and occupation  PERSONAL FACTORS: Age and Time since onset of injury/illness/exacerbation are also affecting patient's functional outcome.   REHAB POTENTIAL: Good  CLINICAL DECISION MAKING: Stable/uncomplicated  EVALUATION COMPLEXITY: Low   GOALS:   SHORT TERM GOALS: Target date: 06/20/2023   Pt will demonstrate appropriate understanding and performance of initially prescribed HEP in order to facilitate improved independence with management of symptoms.  Baseline: HEP established  Goal status: INITIAL   2. Pt will report at least 25% improvement in overall pain levels over past week in order to facilitate improved tolerance to typical daily activities.   Baseline: 1-7/10  Goal status: INITIAL    LONG TERM GOALS: Target date: 08/01/2023 Pt will score less than or equal to 40% on Quick DASH in order to indicate reduced levels of disability due to shoulder pain (MDC 16-20pts).  Baseline: 75%   Goal status: INITIAL  2.  Pt will demonstrate at least 150 degrees of active shoulder elevation on surgical limb in order to demonstrate improved tolerance to functional movement patterns such as reaching overhead.  Baseline: see ROM chart above Goal status: INITIAL  3.  Pt will demonstrate at least 4+/5 shoulder flex/abd MMT for improved symmetry of UE strength and improved tolerance to functional movements.  Baseline: deferred on eval given surgical protocol Goal status: INITIAL  4. Pt will report ability to perform upper body dressing w/ less than 2 pt increase in pain in order to facilitate improved tolerance to ADLs.  Baseline: 1-7/10 pain with usual activities, inc time/difficulty performing in clinic  Goal status: INITIAL   5. Pt will report at least 50% decrease in overall pain levels  in past week in order to facilitate improved tolerance to basic ADLs/mobility.   Baseline: 1-7/10  Goal status: INITIAL   6. Pt will endorse ability to perform usual household  tasks with no more than 2 pt increase in pain in order to return to PLOF.   Baseline: housework limited as expected post op Goal status: INITIAL   PLAN:  PT FREQUENCY: 1-2x/week  PT DURATION: 12 weeks  PLANNED INTERVENTIONS: 97164- PT Re-evaluation, 97110-Therapeutic exercises, 97530- Therapeutic activity, 97112- Neuromuscular re-education, 97535- Self Care, 16109- Manual therapy, [id number]- Electrical stimulation (unattended), Patient/Family education, Taping, Dry Needling, Joint mobilization, Spinal mobilization, Scar mobilization, Cryotherapy, and Moist heat  PLAN FOR NEXT SESSION: continue to progress as able/appropriate per [facility] shoulder labral repair protocol   Perfecto Bracket Chyna Kneece, PT 06/12/2023, 7:27 AM

## 2023-06-14 ENCOUNTER — Ambulatory Visit (HOSPITAL_BASED_OUTPATIENT_CLINIC_OR_DEPARTMENT_OTHER): Admitting: Physical Therapy

## 2023-06-14 ENCOUNTER — Encounter (HOSPITAL_BASED_OUTPATIENT_CLINIC_OR_DEPARTMENT_OTHER): Payer: Self-pay | Admitting: Physical Therapy

## 2023-06-14 DIAGNOSIS — M25512 Pain in left shoulder: Secondary | ICD-10-CM | POA: Diagnosis not present

## 2023-06-14 DIAGNOSIS — M6281 Muscle weakness (generalized): Secondary | ICD-10-CM

## 2023-06-14 DIAGNOSIS — M25612 Stiffness of left shoulder, not elsewhere classified: Secondary | ICD-10-CM | POA: Diagnosis not present

## 2023-06-14 NOTE — Therapy (Signed)
 OUTPATIENT PHYSICAL THERAPY SHOULDER TREATMENT   Patient Name: Seth Shields MRN: [id number] DOB:1990-06-20, 33 y.o., male Today's Date: 06/14/2023  END OF SESSION:  PT End of Session - 06/14/23 0720     Visit Number 8    Number of Visits 25    Date for PT Re-Evaluation 08/01/23    Authorization Type BCBS    PT Start Time 0720    PT Stop Time 0800    PT Time Calculation (min) 40 min    Activity Tolerance Patient tolerated treatment well    Behavior During Therapy WFL for tasks assessed/performed               Past Medical History:  Diagnosis Date   BPH (benign prostatic hyperplasia)    Complication of anesthesia    woke up aggressive after hernia surgery   Inguinal hernia    Labral tear of shoulder    Past Surgical History:  Procedure Laterality Date   HERNIA REPAIR     SHOULDER ARTHROSCOPY WITH LABRAL REPAIR Left 05/07/2023   Procedure: ARTHROSCOPY, SHOULDER, WITH GLENOID LABRUM REPAIR;  Surgeon: Wilhelmenia Harada, MD;  Location: [facility];  Service: Orthopedics;  Laterality: Left;   Patient Active Problem List   Diagnosis Date Noted   Instability of left shoulder joint 05/07/2023   Chronic left shoulder pain 03/26/2023   Solitary pulmonary nodule 10/02/2018   Dyspnea 10/02/2018   Scoliosis deformity of spine 09/21/2017    PCP: Alexander Iba, PA  REFERRING PROVIDER: Wilhelmenia Harada, MD  REFERRING DIAG: [phone] (ICD-10-CM) - Instability of left shoulder joint  THERAPY DIAG:  Left shoulder pain, unspecified chronicity  Stiffness of left shoulder, not elsewhere classified  Muscle weakness (generalized)  Rationale for Evaluation and Treatment: Rehabilitation  ONSET DATE: L shoulder labral repair 05/07/23  SUBJECTIVE:                                                                                                                                                                                      SUBJECTIVE STATEMENT: Patient states  shoulder doing better. Doing HEP. Massage to R UT but still bothering him.    Hand dominance: Right  PERTINENT HISTORY: BPH, L labral repair DOS 05/07/23  PAIN:  Are you having pain: 2/10 stiff Location/description: L anterolateral shoulder  Best-worst over past week: 1-7/10  - aggravating factors: general movement/activity  - Easing factors: medication, icing    PRECAUTIONS: L shoulder Bokshan labral repair protocol  RED FLAGS: None   WEIGHT BEARING RESTRICTIONS: Yes NWB  FALLS:  Has patient fallen in last 6 months? No  LIVING ENVIRONMENT: 1 story home + basement w/ spouse who works  Pt usually does most of cooking, otherwise split housework Has two dogs, beagle and mini aussie   OCCUPATION: Attorney full time  PLOF: Independent - enjoys MMA, going to gym, walk dogs  PATIENT GOALS: get back to PLOF, full mobility, get back in gym   NEXT MD VISIT: 05/29/23  OBJECTIVE:  Note: Objective measures were completed at Evaluation unless otherwise noted.  DIAGNOSTIC FINDINGS:  DOS 05/07/23 labral repair L shoulder  PATIENT SURVEYS:  Quick Dash 75%  COGNITION: Overall cognitive status: Within functional limits for tasks assessed     SENSATION: Denies sensory complaints today  POSTURE: Sling donned ; guarded LUE posture as expected post op  UPPER EXTREMITY ROM:  Passive ROM  Right eval Left eval Left AROM 06/06/23  Shoulder flexion  P: <25 deg limited by muscle guarding 130 improves to 150 154 AAROM  Shoulder abduction  P: ~30 deg limited by muscle guarding     Shoulder internal rotation     Shoulder external rotation (at side unless otherwise noted)     Elbow flexion     Elbow extension     Wrist flexion     Wrist extension      (Blank rows = not tested) (Key: WFL = within functional limits not formally assessed, * = concordant pain, s = stiffness/stretching sensation, NT = not tested)  Comments:   UPPER EXTREMITY MMT:  MMT Right eval Left eval   Shoulder flexion    Shoulder extension    Shoulder abduction    Shoulder adduction    Shoulder internal rotation    Shoulder external rotation    Middle trapezius    Lower trapezius    Elbow flexion    Elbow extension    Wrist flexion    Wrist extension    Wrist ulnar deviation    Wrist radial deviation    Wrist pronation    Wrist supination    Grip strength (lbs)    (Blank rows = not tested) (Key: WFL = within functional limits not formally assessed, * = concordant pain, s = stiffness/stretching sensation, NT = not tested)  Comments: deferred on eval given post op protocol and acuity of surgery   PALPATION/OBSERVATION:  3 portal incisions (anterior, lateral, and posterior) appear grossly WNL, mild bleeding apparent anterior incision but not excessive. No significant erythema or swelling apparent, no excessive warmth.  Able to doff sling w/o assist, inc time/effort and guarding noted of LUE Min assist to don sling Able to perform upper body dressing for wound inspection w/o assist, inc time/effort noted and guarding LUE                                                                                                                              TREATMENT DATE:   06/14/23 Manual:STM to R UT Supine Shoulder AAROM flexion with 2# bar 2 x 10 Supine ABC 1# x 2  Sidelying ER 1# 2 x 10 Standing bilateral  ER with scap retraction RTB 2 x 10 Standing shoulder horizontal abduction RTB 2 x 10  Standing row BTB 2 x 15 Standing shoulder extension BTB 2 x 10  06/12/23 Manual:STM to R UT, manual UT stretch;  Grade II-III inferior glide in progressive flexion and abduction, pin and stretch L lat Supine AAROM flexion 2 x 10 Supine Shoulder AAROM flexion with 2# bar 2 x 10 Bent shoulder extension 1# 2 x 10 Bent shoulder horizontal abduction 1# 2 x 10 Bent row 1# 2 x 10  06/06/23 Manual: Grade II-III inferior glide in progressive flexion and abduction, Grade II-III AP glide in progressive  ER Supine Shoulder AAROM flexion with cane 2 x 10 Pulley flexion 2 minutes, scaption 2 minutes Prone shoulder extension 2 x 10 Prone shoulder horizontal abduction 2 x 10 Sidelying ER 2 x 10 Standing row RTB 2 x 15 Standing shoulder extension RTB 2 x 10  06/04/23 Manual: Grade II-III inferior glide in progressive flexion, Grade II-III AP glide in progressive ER Supine Shoulder AAROM flexion with cane 2 x 10 Supine shoulder AAROM ER with cane 2 x 10 Standing row RTB 3 x 15 Prone shoulder extension 2 x 10 Sidelying ER 2 x 10 PROM L shoulder   Treatment                            4/23: Blank lines following charge title = not provided on this treatment date.   Manual:  Grade II and III inferior and posterior glides PROM into flexion and ER with distraction following protocol guidelines  Trigger point release to upper trap and scapular area  Use of Thera cane for self soft tissue mobilization.  There-ex:           Chest press 3x10 1 lb bar mild pain at first but improved motion as he practiced Active assistive ER 3 x 10 with  Seated self flexion in low range 3 x 10 Table slide to limited motion 3 x 10 Reviewed scapular retraction for home There-Act:  Self Care:  Nuro-Re-ed:  Gait Training:  Treatment                            4/15: Blank lines following charge title = not provided on this treatment date.   Manual:  TPDN No STM pecs, subscap, infraspintaus, levator, upper trap PROM within protocol limits- easily able to achieve goals AP GHJ mobs at 30 deg abd There-ex:  There-Act:  Self Care:  Nuro-Re-ed:  Gait Training:   [facility] Adult PT Treatment:                                                DATE: 05/16/23 Therapeutic Exercise: L Elbow flex/ext AAROM L wrist supination/pronation AAROM Beginner pendulum   Manual Therapy: PROM of L shoulder into flexion (~45), abdct (~25), ER, IR -- within rehab protocol restrictions and pt tolerance STM to L cervical  paraspinals, L levator and upper trap to decrease fascial restrictions Gentle STM/IASTM to distal-mid biceps brachii   Self Care: Education on rehab protocol, continuing icing regimen, wound inspection + replacing dressing w/ gauze/tegaderm     [facility] Adult PT Treatment:  DATE: 05/09/23 Therapeutic Exercise: Elbow flex/ext AAROM, wrist supination/pronation AAROM, towel gripping practice reps, HEP handout  Self Care: Education on sling use, phase one of rehab protocol, surgical restrictions, monitoring surgical site for complications and communication w/ provider as indicated, continuing icing regimen, wound inspection + replacing dressing w/ gauze/tegaderm      PATIENT EDUCATION: Education details: Pt education on PT impairments, prognosis, and POC. Informed consent. Rationale for interventions, safe/appropriate HEP performance, post op precautions and self care as above Person educated: Patient Education method: Explanation, Demonstration, Tactile cues, Verbal cues Education comprehension: verbalized understanding, returned demonstration, verbal cues required, tactile cues required, and needs further education    HOME EXERCISE PROGRAM: Access Code: [email or web address] URL: [email or web address] Date: 05/09/2023 Prepared by: Mayme Spearman  Exercises - Seated Elbow Flexion AAROM  - 2-3 x daily - 7 x weekly - 1 sets - 10 reps - Seated AAROM Elbow Supination/Pronation with Clasped Hands  - 2-3 x daily - 7 x weekly - 1 sets - 10 reps - Seated Gripping Towel  - 2-3 x daily - 7 x weekly - 1 sets - 10 reps  ASSESSMENT:  CLINICAL IMPRESSION: Patient with R UT symptoms  which improve with manual. Continued with RC and periscap strengthening with additional performed today which are tolerated well. Patient will continue to benefit from physical therapy in order to improve function and reduce impairment.    OBJECTIVE IMPAIRMENTS: decreased  activity tolerance, decreased mobility, decreased ROM, decreased strength, impaired perceived functional ability, impaired UE functional use, postural dysfunction, and pain.   ACTIVITY LIMITATIONS: carrying, lifting, sleeping, bathing, dressing, reach over head, and hygiene/grooming  PARTICIPATION LIMITATIONS: meal prep, cleaning, laundry, driving, community activity, and occupation  PERSONAL FACTORS: Age and Time since onset of injury/illness/exacerbation are also affecting patient's functional outcome.   REHAB POTENTIAL: Good  CLINICAL DECISION MAKING: Stable/uncomplicated  EVALUATION COMPLEXITY: Low   GOALS:   SHORT TERM GOALS: Target date: 06/20/2023   Pt will demonstrate appropriate understanding and performance of initially prescribed HEP in order to facilitate improved independence with management of symptoms.  Baseline: HEP established  Goal status: INITIAL   2. Pt will report at least 25% improvement in overall pain levels over past week in order to facilitate improved tolerance to typical daily activities.   Baseline: 1-7/10  Goal status: INITIAL    LONG TERM GOALS: Target date: 08/01/2023 Pt will score less than or equal to 40% on Quick DASH in order to indicate reduced levels of disability due to shoulder pain (MDC 16-20pts).  Baseline: 75%   Goal status: INITIAL  2.  Pt will demonstrate at least 150 degrees of active shoulder elevation on surgical limb in order to demonstrate improved tolerance to functional movement patterns such as reaching overhead.  Baseline: see ROM chart above Goal status: INITIAL  3.  Pt will demonstrate at least 4+/5 shoulder flex/abd MMT for improved symmetry of UE strength and improved tolerance to functional movements.  Baseline: deferred on eval given surgical protocol Goal status: INITIAL  4. Pt will report ability to perform upper body dressing w/ less than 2 pt increase in pain in order to facilitate improved tolerance to  ADLs.  Baseline: 1-7/10 pain with usual activities, inc time/difficulty performing in clinic  Goal status: INITIAL   5. Pt will report at least 50% decrease in overall pain levels in past week in order to facilitate improved tolerance to basic ADLs/mobility.   Baseline: 1-7/10  Goal status: INITIAL   6. Pt will endorse ability  to perform usual household tasks with no more than 2 pt increase in pain in order to return to PLOF.   Baseline: housework limited as expected post op Goal status: INITIAL   PLAN:  PT FREQUENCY: 1-2x/week  PT DURATION: 12 weeks  PLANNED INTERVENTIONS: 97164- PT Re-evaluation, 97110-Therapeutic exercises, 97530- Therapeutic activity, 97112- Neuromuscular re-education, 97535- Self Care, 57846- Manual therapy, [id number]- Electrical stimulation (unattended), Patient/Family education, Taping, Dry Needling, Joint mobilization, Spinal mobilization, Scar mobilization, Cryotherapy, and Moist heat  PLAN FOR NEXT SESSION: continue to progress as able/appropriate per Bokshan shoulder labral repair protocol   Perfecto Bracket Haniel Fix, PT 06/14/2023, 8:11 AM

## 2023-06-15 ENCOUNTER — Other Ambulatory Visit: Payer: Self-pay | Admitting: Family Medicine

## 2023-06-15 DIAGNOSIS — M25512 Pain in left shoulder: Secondary | ICD-10-CM

## 2023-06-17 ENCOUNTER — Encounter: Payer: Self-pay | Admitting: Physician Assistant

## 2023-06-17 NOTE — Telephone Encounter (Signed)
 Last OV 03/19/23 Next OV not scheduled   Last refill 05/17/23 Qty # 30/0    Renal labs 01/08/22  Component Ref Range & Units (hover) 1 yr ago (01/08/22) 4 yr ago (06/05/19) 4 yr ago (09/08/18)  Sodium 137 140 139  Potassium 4.6 5.1 5.1  Chloride 101 105 102  CO2 29 28 29   Glucose, Bld 72 99 95  BUN 15 17 17   Creatinine, Ser 1.43 1.31 1.15  Total Bilirubin 0.5 0.7 0.5  Alkaline Phosphatase 63 55 59  AST 25 25 19   ALT 24 19 18   Total Protein 6.8 7.0 7.1  Albumin 4.7 4.9 4.9  GFR 65.95 65.40 76.43  Comment: Calculated using the CKD-EPI Creatinine Equation (2021)  Calcium 9.8 9.7 9.9

## 2023-06-18 ENCOUNTER — Ambulatory Visit (HOSPITAL_BASED_OUTPATIENT_CLINIC_OR_DEPARTMENT_OTHER)

## 2023-06-18 ENCOUNTER — Ambulatory Visit: Admitting: Physician Assistant

## 2023-06-18 VITALS — BP 118/72 | HR 83 | Temp 98.4°F | Ht 72.0 in | Wt 175.4 lb

## 2023-06-18 DIAGNOSIS — J069 Acute upper respiratory infection, unspecified: Secondary | ICD-10-CM | POA: Diagnosis not present

## 2023-06-18 DIAGNOSIS — J0100 Acute maxillary sinusitis, unspecified: Secondary | ICD-10-CM

## 2023-06-18 MED ORDER — BENZONATATE 100 MG PO CAPS: 100.0000 mg | ORAL_CAPSULE | Freq: Three times a day (TID) | ORAL | 0 refills | Status: AC | PRN

## 2023-06-18 MED ORDER — AZITHROMYCIN 250 MG PO TABS: ORAL_TABLET | ORAL | 0 refills | Status: AC

## 2023-06-18 NOTE — Progress Notes (Signed)
 Patient ID: Seth Shields, male    DOB: [birth year], 33 y.o.   MRN: [id number]   Assessment & Plan:  Acute URI  Acute maxillary sinusitis, recurrence not specified  Other orders -     Azithromycin; Take 2 tablets on day 1, then 1 tablet daily on days 2 through 5  Dispense: 6 tablet; Refill: 0 -     Benzonatate ; Take 1 capsule (100 mg total) by mouth 3 (three) times daily as needed for up to 7 days for cough.  Dispense: 20 capsule; Refill: 0     Assessment & Plan Acute upper respiratory infection Acute upper respiratory infection presenting with cough, congestion, and fatigue. No fever, but occasional chills. No known exposure to sick contacts. Differential diagnosis includes COVID-19, though not strongly suspected due to lack of risk factors and absence of fever. COVID-19 testing is optional for confirmation, but treatment remains unchanged regardless of result. - Prescribe azithromycin (Z-Pak) for cough and chest symptoms. - Recommend nasal saline and Flonase for sinus decongestion. - Consider over-the-counter COVID-19 testing for confirmation if desired. - Advise zinc, vitamin C, and vitamin D supplementation to support recovery. - Prescribe Tessalon  Perles PRN for cough suppression, up to 7 days.  Acute sinusitis Acute sinusitis likely secondary to allergies and structural nasal issues, with severe congestion, particularly on the left side, and tenderness under the eye sockets. Recurrent sinus infections occur around this time of year. He inquired about contagiousness; absence of fever is a positive indicator, but caution with coughing is advised. - Prescribe azithromycin (Z-Pak) for sinusitis. - Recommend nasal saline and Flonase for sinus decongestion. - Advise zinc, vitamin C, and vitamin D supplementation to support recovery.      No follow-ups on file.    Subjective:    Chief Complaint  Patient presents with   Cough    Pt in office c/o cough congestion and  headache; see MyChart msg, needing antibiotic req OV symptoms started Sunday.     Cough   Discussed the use of AI scribe software for clinical note transcription with the patient, who gave verbal consent to proceed.  History of Present Illness Seth Shields is a 33 year old male who presents with cough and congestion.  He experienced the onset of symptoms over the weekend, including a cough and significant congestion, particularly on the left side of his face, which he describes as feeling like 'wet concrete.' These symptoms are exacerbated by his allergies, which typically do not bother him significantly. He has experienced similar symptoms in the past, often resulting in sinus infections due to his allergies and a history of multiple nasal fractures, which may contribute to structural issues.  He denies having a fever but mentions feeling 'really hot' and experiencing occasional chills. He has not been around anyone known to be sick and has not traveled recently, except for a trip to [facility], [place] , the weekend before last. He has not taken any over-the-counter medications for these symptoms until last night when he took Nyquil, which did not alleviate his symptoms.     Past Medical History:  Diagnosis Date   BPH (benign prostatic hyperplasia)    Complication of anesthesia    woke up aggressive after hernia surgery   Inguinal hernia    Labral tear of shoulder     Past Surgical History:  Procedure Laterality Date   HERNIA REPAIR     SHOULDER ARTHROSCOPY WITH LABRAL REPAIR Left 05/07/2023   Procedure: ARTHROSCOPY, SHOULDER, WITH GLENOID LABRUM  REPAIR;  Surgeon: Wilhelmenia Harada, MD;  Location: [facility];  Service: Orthopedics;  Laterality: Left;    Family History  Problem Relation Age of Onset   Depression Sister        Manic Depression   Heart disease Brother    Liver cancer Paternal Grandmother        ?lung cancer   Liver cancer Paternal Grandfather      Social History   Tobacco Use   Smoking status: Former    Current packs/day: 0.00    Average packs/day: 0.5 packs/day for 0.7 years (0.4 ttl pk-yrs)    Types: Cigarettes    Start date: 06/30/2017    Quit date: 03/31/2018    Years since quitting: 5.2   Smokeless tobacco: Never  Vaping Use   Vaping status: Never Used  Substance Use Topics   Alcohol use: Yes    Comment: 6/wk Beer and wine   Drug use: Never     No Known Allergies  Review of Systems  Respiratory:  Positive for cough.    NEGATIVE UNLESS OTHERWISE INDICATED IN HPI      Objective:     BP 118/72 (BP Location: Left Arm, Patient Position: Sitting, Cuff Size: Normal)   Pulse 83   Temp 98.4 F (36.9 C) (Temporal)   Ht 6' (1.829 m)   Wt 175 lb 6.4 oz (79.6 kg)   SpO2 99%   BMI 23.79 kg/m   Wt Readings from Last 3 Encounters:  06/18/23 175 lb 6.4 oz (79.6 kg)  05/07/23 179 lb 3.7 oz (81.3 kg)  03/19/23 184 lb (83.5 kg)    BP Readings from Last 3 Encounters:  06/18/23 118/72  05/07/23 (!) 133/91  03/19/23 (!) 142/84     Physical Exam Vitals and nursing note reviewed.  Constitutional:      General: He is not in acute distress.    Appearance: Normal appearance. He is not ill-appearing.  HENT:     Head: Normocephalic.     Right Ear: Tympanic membrane, ear canal and external ear normal.     Left Ear: Tympanic membrane, ear canal and external ear normal.     Nose: Congestion present.     Right Sinus: Maxillary sinus tenderness present.     Left Sinus: Maxillary sinus tenderness present.     Mouth/Throat:     Mouth: Mucous membranes are moist.     Pharynx: No oropharyngeal exudate or posterior oropharyngeal erythema.  Eyes:     Extraocular Movements: Extraocular movements intact.     Conjunctiva/sclera: Conjunctivae normal.     Pupils: Pupils are equal, round, and reactive to light.  Cardiovascular:     Rate and Rhythm: Normal rate and regular rhythm.     Pulses: Normal pulses.     Heart  sounds: Normal heart sounds. No murmur heard. Pulmonary:     Effort: Pulmonary effort is normal. No respiratory distress.     Breath sounds: Normal breath sounds. No wheezing.  Musculoskeletal:     Cervical back: Normal range of motion.  Skin:    General: Skin is warm.  Neurological:     Mental Status: He is alert and oriented to person, place, and time.  Psychiatric:        Mood and Affect: Mood normal.        Behavior: Behavior normal.             Crystalann Korf M Hommer Cunliffe, PA-C

## 2023-06-20 ENCOUNTER — Encounter (HOSPITAL_BASED_OUTPATIENT_CLINIC_OR_DEPARTMENT_OTHER): Payer: Self-pay

## 2023-06-20 ENCOUNTER — Ambulatory Visit (HOSPITAL_BASED_OUTPATIENT_CLINIC_OR_DEPARTMENT_OTHER)

## 2023-06-20 DIAGNOSIS — M6281 Muscle weakness (generalized): Secondary | ICD-10-CM

## 2023-06-20 DIAGNOSIS — M25512 Pain in left shoulder: Secondary | ICD-10-CM

## 2023-06-20 DIAGNOSIS — M25612 Stiffness of left shoulder, not elsewhere classified: Secondary | ICD-10-CM

## 2023-06-20 NOTE — Therapy (Signed)
 OUTPATIENT PHYSICAL THERAPY SHOULDER TREATMENT   Patient Name: Seth Shields MRN: [id number] DOB:01/07/1991, 33 y.o., male Today's Date: 06/20/2023  END OF SESSION:  PT End of Session - 06/20/23 0809     Visit Number 9    Number of Visits 25    Date for PT Re-Evaluation 08/01/23    Authorization Type BCBS    PT Start Time 0807    PT Stop Time 0846    PT Time Calculation (min) 39 min    Activity Tolerance Patient tolerated treatment well    Behavior During Therapy WFL for tasks assessed/performed                Past Medical History:  Diagnosis Date   BPH (benign prostatic hyperplasia)    Complication of anesthesia    woke up aggressive after hernia surgery   Inguinal hernia    Labral tear of shoulder    Past Surgical History:  Procedure Laterality Date   HERNIA REPAIR     SHOULDER ARTHROSCOPY WITH LABRAL REPAIR Left 05/07/2023   Procedure: ARTHROSCOPY, SHOULDER, WITH GLENOID LABRUM REPAIR;  Surgeon: Wilhelmenia Harada, MD;  Location: [facility];  Service: Orthopedics;  Laterality: Left;   Patient Active Problem List   Diagnosis Date Noted   Instability of left shoulder joint 05/07/2023   Chronic left shoulder pain 03/26/2023   Solitary pulmonary nodule 10/02/2018   Dyspnea 10/02/2018   Scoliosis deformity of spine 09/21/2017    PCP: Alexander Iba, PA  REFERRING PROVIDER: Wilhelmenia Harada, MD  REFERRING DIAG: [phone] (ICD-10-CM) - Instability of left shoulder joint  THERAPY DIAG:  Left shoulder pain, unspecified chronicity  Stiffness of left shoulder, not elsewhere classified  Muscle weakness (generalized)  Rationale for Evaluation and Treatment: Rehabilitation  ONSET DATE: L shoulder labral repair 05/07/23  SUBJECTIVE:                                                                                                                                                                                      SUBJECTIVE STATEMENT: Pt has been sick  over the weekend. Has increased tightness/soreness in L shoulder. R UT is feeling better. Some pain with resisted H abduction exercise and unable to complete all reps with this.    Hand dominance: Right  PERTINENT HISTORY: BPH, L labral repair DOS 05/07/23  PAIN:  Are you having pain: 2/10 stiff Location/description: L anterolateral shoulder  Best-worst over past week: 1-7/10  - aggravating factors: general movement/activity  - Easing factors: medication, icing    PRECAUTIONS: L shoulder Bokshan labral repair protocol  RED FLAGS: None   WEIGHT BEARING RESTRICTIONS: Yes NWB  FALLS:  Has patient  fallen in last 6 months? No  LIVING ENVIRONMENT: 1 story home + basement w/ spouse who works  Pt usually does most of cooking, otherwise split housework Has two dogs, beagle and mini aussie   OCCUPATION: Attorney full time  PLOF: Independent - enjoys MMA, going to gym, walk dogs  PATIENT GOALS: get back to PLOF, full mobility, get back in gym   NEXT MD VISIT: 05/29/23  OBJECTIVE:  Note: Objective measures were completed at Evaluation unless otherwise noted.  DIAGNOSTIC FINDINGS:  DOS 05/07/23 labral repair L shoulder  PATIENT SURVEYS:  Quick Dash 75%  COGNITION: Overall cognitive status: Within functional limits for tasks assessed     SENSATION: Denies sensory complaints today  POSTURE: Sling donned ; guarded LUE posture as expected post op  UPPER EXTREMITY ROM:  Passive ROM  Right eval Left eval Left AROM 06/06/23  Shoulder flexion  P: <25 deg limited by muscle guarding 130 improves to 150 154 AAROM  Shoulder abduction  P: ~30 deg limited by muscle guarding     Shoulder internal rotation     Shoulder external rotation (at side unless otherwise noted)     Elbow flexion     Elbow extension     Wrist flexion     Wrist extension      (Blank rows = not tested) (Key: WFL = within functional limits not formally assessed, * = concordant pain, s = stiffness/stretching  sensation, NT = not tested)  Comments:   UPPER EXTREMITY MMT:  MMT Right eval Left eval  Shoulder flexion    Shoulder extension    Shoulder abduction    Shoulder adduction    Shoulder internal rotation    Shoulder external rotation    Middle trapezius    Lower trapezius    Elbow flexion    Elbow extension    Wrist flexion    Wrist extension    Wrist ulnar deviation    Wrist radial deviation    Wrist pronation    Wrist supination    Grip strength (lbs)    (Blank rows = not tested) (Key: WFL = within functional limits not formally assessed, * = concordant pain, s = stiffness/stretching sensation, NT = not tested)  Comments: deferred on eval given post op protocol and acuity of surgery   PALPATION/OBSERVATION:  3 portal incisions (anterior, lateral, and posterior) appear grossly WNL, mild bleeding apparent anterior incision but not excessive. No significant erythema or swelling apparent, no excessive warmth.  Able to doff sling w/o assist, inc time/effort and guarding noted of LUE Min assist to don sling Able to perform upper body dressing for wound inspection w/o assist, inc time/effort noted and guarding LUE                                                                                                                              TREATMENT DATE:   5/15 STM to bil UT PROM L shoulder Supine AAROM  flexion 2# bar Supine active flexion 2x10 Supine ABC 1# x1 Sidelying ER 1# x10, 2# 2x10 Seated bil ER RTB 2x10 4way ball rolls at wall 2.2# ball x20ea      06/14/23 Manual:STM to R UT Supine Shoulder AAROM flexion with 2# bar 2 x 10 Supine ABC 1# x 2  Sidelying ER 1# 2 x 10 Standing bilateral ER with scap retraction RTB 2 x 10 Standing shoulder horizontal abduction RTB 2 x 10  Standing row BTB 2 x 15 Standing shoulder extension BTB 2 x 10  06/12/23 Manual:STM to R UT, manual UT stretch;  Grade II-III inferior glide in progressive flexion and abduction, pin and  stretch L lat Supine AAROM flexion 2 x 10 Supine Shoulder AAROM flexion with 2# bar 2 x 10 Bent shoulder extension 1# 2 x 10 Bent shoulder horizontal abduction 1# 2 x 10 Bent row 1# 2 x 10  06/06/23 Manual: Grade II-III inferior glide in progressive flexion and abduction, Grade II-III AP glide in progressive ER Supine Shoulder AAROM flexion with cane 2 x 10 Pulley flexion 2 minutes, scaption 2 minutes Prone shoulder extension 2 x 10 Prone shoulder horizontal abduction 2 x 10 Sidelying ER 2 x 10 Standing row RTB 2 x 15 Standing shoulder extension RTB 2 x 10  06/04/23 Manual: Grade II-III inferior glide in progressive flexion, Grade II-III AP glide in progressive ER Supine Shoulder AAROM flexion with cane 2 x 10 Supine shoulder AAROM ER with cane 2 x 10 Standing row RTB 3 x 15 Prone shoulder extension 2 x 10 Sidelying ER 2 x 10 PROM L shoulder     PATIENT EDUCATION: Education details: Pt education on PT impairments, prognosis, and POC. Informed consent. Rationale for interventions, safe/appropriate HEP performance, post op precautions and self care as above Person educated: Patient Education method: Explanation, Demonstration, Tactile cues, Verbal cues Education comprehension: verbalized understanding, returned demonstration, verbal cues required, tactile cues required, and needs further education    HOME EXERCISE PROGRAM: Access Code: [email or web address] URL: [email or web address] Date: 05/09/2023 Prepared by: Mayme Spearman  Exercises - Seated Elbow Flexion AAROM  - 2-3 x daily - 7 x weekly - 1 sets - 10 reps - Seated AAROM Elbow Supination/Pronation with Clasped Hands  - 2-3 x daily - 7 x weekly - 1 sets - 10 reps - Seated Gripping Towel  - 2-3 x daily - 7 x weekly - 1 sets - 10 reps  ASSESSMENT:  CLINICAL IMPRESSION: Instructed pt to avoid resisted horizontal abduction at this time. Required cues for proper performance with resisted bil ER. Denied significant  discomfort with exercises today. Able to increase resistance with s/l ER to 2#. Continued with MT to bil UT to address ongoing restrictions, though this has improved from previous sessions. Will continue to progress as tolerated with protocol.     OBJECTIVE IMPAIRMENTS: decreased activity tolerance, decreased mobility, decreased ROM, decreased strength, impaired perceived functional ability, impaired UE functional use, postural dysfunction, and pain.   ACTIVITY LIMITATIONS: carrying, lifting, sleeping, bathing, dressing, reach over head, and hygiene/grooming  PARTICIPATION LIMITATIONS: meal prep, cleaning, laundry, driving, community activity, and occupation  PERSONAL FACTORS: Age and Time since onset of injury/illness/exacerbation are also affecting patient's functional outcome.   REHAB POTENTIAL: Good  CLINICAL DECISION MAKING: Stable/uncomplicated  EVALUATION COMPLEXITY: Low   GOALS:   SHORT TERM GOALS: Target date: 06/20/2023   Pt will demonstrate appropriate understanding and performance of initially prescribed HEP in order to facilitate improved independence with management of symptoms.  Baseline: HEP established  Goal status: MET 5/15   2. Pt will report at least 25% improvement in overall pain levels over past week in order to facilitate improved tolerance to typical daily activities.   Baseline: 1-7/10  Goal status: INITIAL    LONG TERM GOALS: Target date: 08/01/2023 Pt will score less than or equal to 40% on Quick DASH in order to indicate reduced levels of disability due to shoulder pain (MDC 16-20pts).  Baseline: 75%   Goal status: INITIAL  2.  Pt will demonstrate at least 150 degrees of active shoulder elevation on surgical limb in order to demonstrate improved tolerance to functional movement patterns such as reaching overhead.  Baseline: see ROM chart above Goal status: INITIAL  3.  Pt will demonstrate at least 4+/5 shoulder flex/abd MMT for improved symmetry of  UE strength and improved tolerance to functional movements.  Baseline: deferred on eval given surgical protocol Goal status: INITIAL  4. Pt will report ability to perform upper body dressing w/ less than 2 pt increase in pain in order to facilitate improved tolerance to ADLs.  Baseline: 1-7/10 pain with usual activities, inc time/difficulty performing in clinic  Goal status: INITIAL   5. Pt will report at least 50% decrease in overall pain levels in past week in order to facilitate improved tolerance to basic ADLs/mobility.   Baseline: 1-7/10  Goal status: INITIAL   6. Pt will endorse ability to perform usual household tasks with no more than 2 pt increase in pain in order to return to PLOF.   Baseline: housework limited as expected post op Goal status: INITIAL   PLAN:  PT FREQUENCY: 1-2x/week  PT DURATION: 12 weeks  PLANNED INTERVENTIONS: 97164- PT Re-evaluation, 97110-Therapeutic exercises, 97530- Therapeutic activity, 97112- Neuromuscular re-education, 97535- Self Care, 16109- Manual therapy, [id number]- Electrical stimulation (unattended), Patient/Family education, Taping, Dry Needling, Joint mobilization, Spinal mobilization, Scar mobilization, Cryotherapy, and Moist heat  PLAN FOR NEXT SESSION: continue to progress as able/appropriate per Bokshan shoulder labral repair protocol   Fronie Jewett Henrietta Cieslewicz, PTA 06/20/2023, 9:22 AM

## 2023-06-25 ENCOUNTER — Encounter (HOSPITAL_BASED_OUTPATIENT_CLINIC_OR_DEPARTMENT_OTHER): Payer: Self-pay | Admitting: Physical Therapy

## 2023-06-25 ENCOUNTER — Ambulatory Visit (HOSPITAL_BASED_OUTPATIENT_CLINIC_OR_DEPARTMENT_OTHER): Admitting: Physical Therapy

## 2023-06-25 DIAGNOSIS — M25512 Pain in left shoulder: Secondary | ICD-10-CM | POA: Diagnosis not present

## 2023-06-25 DIAGNOSIS — M25612 Stiffness of left shoulder, not elsewhere classified: Secondary | ICD-10-CM | POA: Diagnosis not present

## 2023-06-25 DIAGNOSIS — M6281 Muscle weakness (generalized): Secondary | ICD-10-CM | POA: Diagnosis not present

## 2023-06-25 NOTE — Therapy (Signed)
 OUTPATIENT PHYSICAL THERAPY SHOULDER TREATMENT   Patient Name: Seth Shields MRN: [id number] DOB:05/06/1990, 33 y.o., male Today's Date: 06/25/2023  END OF SESSION:  PT End of Session - 06/25/23 0719     Visit Number 10    Number of Visits 25    Date for PT Re-Evaluation 08/01/23    Authorization Type BCBS    PT Start Time 0718    PT Stop Time 0758    PT Time Calculation (min) 40 min    Activity Tolerance Patient tolerated treatment well    Behavior During Therapy WFL for tasks assessed/performed                Past Medical History:  Diagnosis Date   BPH (benign prostatic hyperplasia)    Complication of anesthesia    woke up aggressive after hernia surgery   Inguinal hernia    Labral tear of shoulder    Past Surgical History:  Procedure Laterality Date   HERNIA REPAIR     SHOULDER ARTHROSCOPY WITH LABRAL REPAIR Left 05/07/2023   Procedure: ARTHROSCOPY, SHOULDER, WITH GLENOID LABRUM REPAIR;  Surgeon: Wilhelmenia Harada, MD;  Location: [facility];  Service: Orthopedics;  Laterality: Left;   Patient Active Problem List   Diagnosis Date Noted   Instability of left shoulder joint 05/07/2023   Chronic left shoulder pain 03/26/2023   Solitary pulmonary nodule 10/02/2018   Dyspnea 10/02/2018   Scoliosis deformity of spine 09/21/2017    PCP: Alexander Iba, PA  REFERRING PROVIDER: Wilhelmenia Harada, MD  REFERRING DIAG: [phone] (ICD-10-CM) - Instability of left shoulder joint  THERAPY DIAG:  Left shoulder pain, unspecified chronicity  Stiffness of left shoulder, not elsewhere classified  Muscle weakness (generalized)  Rationale for Evaluation and Treatment: Rehabilitation  ONSET DATE: L shoulder labral repair 05/07/23  SUBJECTIVE:                                                                                                                                                                                      SUBJECTIVE STATEMENT: Pt states  sleeping positions can irritate shoulder. Pec gets tight. HEP going well.    Hand dominance: Right  PERTINENT HISTORY: BPH, L labral repair DOS 05/07/23  PAIN:  Are you having pain: 1/10 stiff Location/description: L anterolateral shoulder  Best-worst over past week: 1-7/10  - aggravating factors: general movement/activity  - Easing factors: medication, icing    PRECAUTIONS: L shoulder Bokshan labral repair protocol  RED FLAGS: None   WEIGHT BEARING RESTRICTIONS: Yes NWB  FALLS:  Has patient fallen in last 6 months? No  LIVING ENVIRONMENT: 1 story home + basement w/ spouse who works  Pt  usually does most of cooking, otherwise split housework Has two dogs, beagle and mini aussie   OCCUPATION: Attorney full time  PLOF: Independent - enjoys MMA, going to gym, walk dogs  PATIENT GOALS: get back to PLOF, full mobility, get back in gym   NEXT MD VISIT: 05/29/23  OBJECTIVE:  Note: Objective measures were completed at Evaluation unless otherwise noted.  DIAGNOSTIC FINDINGS:  DOS 05/07/23 labral repair L shoulder  PATIENT SURVEYS:  Quick Dash 75%  COGNITION: Overall cognitive status: Within functional limits for tasks assessed     SENSATION: Denies sensory complaints today  POSTURE: Sling donned ; guarded LUE posture as expected post op  UPPER EXTREMITY ROM:  Passive ROM  Right eval Left eval Left AROM 06/06/23  Shoulder flexion  P: <25 deg limited by muscle guarding 130 improves to 150 154 AAROM  Shoulder abduction  P: ~30 deg limited by muscle guarding     Shoulder internal rotation     Shoulder external rotation (at side unless otherwise noted)     Elbow flexion     Elbow extension     Wrist flexion     Wrist extension      (Blank rows = not tested) (Key: WFL = within functional limits not formally assessed, * = concordant pain, s = stiffness/stretching sensation, NT = not tested)  Comments:   UPPER EXTREMITY MMT:  MMT Right eval Left eval   Shoulder flexion    Shoulder extension    Shoulder abduction    Shoulder adduction    Shoulder internal rotation    Shoulder external rotation    Middle trapezius    Lower trapezius    Elbow flexion    Elbow extension    Wrist flexion    Wrist extension    Wrist ulnar deviation    Wrist radial deviation    Wrist pronation    Wrist supination    Grip strength (lbs)    (Blank rows = not tested) (Key: WFL = within functional limits not formally assessed, * = concordant pain, s = stiffness/stretching sensation, NT = not tested)  Comments: deferred on eval given post op protocol and acuity of surgery   PALPATION/OBSERVATION:  3 portal incisions (anterior, lateral, and posterior) appear grossly WNL, mild bleeding apparent anterior incision but not excessive. No significant erythema or swelling apparent, no excessive warmth.  Able to doff sling w/o assist, inc time/effort and guarding noted of LUE Min assist to don sling Able to perform upper body dressing for wound inspection w/o assist, inc time/effort noted and guarding LUE                                                                                                                              TREATMENT DATE:  06/25/23 Pulley 2 minutes flexion Manual:STM to L pec, Grade II-III inferior glide in progressive flexion  Supine Shoulder AAROM flexion with 3# bar 2 x 10 Supine ABC 2# x2  Sidelying ER 2# 3x10 Supine shoulder PNF D2 RTB 2 x 10 Standing shoulder abduction with perpendicular resistance RTB 2 x 10 Standing shoulder flexion with perpendicular resistance RTB 2 x 10 Standing shoulder flexion with loop band RTB 2 x 10  5/15 STM to bil UT PROM L shoulder Supine AAROM flexion 2# bar Supine active flexion 2x10 Supine ABC 1# x1 Sidelying ER 1# x10, 2# 2x10 Seated bil ER RTB 2x10 4way ball rolls at wall 2.2# ball x20ea   06/14/23 Manual:STM to R UT Supine Shoulder AAROM flexion with 2# bar 2 x 10 Supine ABC 1# x 2   Sidelying ER 1# 2 x 10 Standing bilateral ER with scap retraction RTB 2 x 10 Standing shoulder horizontal abduction RTB 2 x 10  Standing row BTB 2 x 15 Standing shoulder extension BTB 2 x 10  06/12/23 Manual:STM to R UT, manual UT stretch;  Grade II-III inferior glide in progressive flexion and abduction, pin and stretch L lat Supine AAROM flexion 2 x 10 Supine Shoulder AAROM flexion with 2# bar 2 x 10 Bent shoulder extension 1# 2 x 10 Bent shoulder horizontal abduction 1# 2 x 10 Bent row 1# 2 x 10  06/06/23 Manual: Grade II-III inferior glide in progressive flexion and abduction, Grade II-III AP glide in progressive ER Supine Shoulder AAROM flexion with cane 2 x 10 Pulley flexion 2 minutes, scaption 2 minutes Prone shoulder extension 2 x 10 Prone shoulder horizontal abduction 2 x 10 Sidelying ER 2 x 10 Standing row RTB 2 x 15 Standing shoulder extension RTB 2 x 10  06/04/23 Manual: Grade II-III inferior glide in progressive flexion, Grade II-III AP glide in progressive ER Supine Shoulder AAROM flexion with cane 2 x 10 Supine shoulder AAROM ER with cane 2 x 10 Standing row RTB 3 x 15 Prone shoulder extension 2 x 10 Sidelying ER 2 x 10 PROM L shoulder     PATIENT EDUCATION: Education details: Pt education on PT impairments, prognosis, and POC. Informed consent. Rationale for interventions, safe/appropriate HEP performance, post op precautions and self care as above Person educated: Patient Education method: Explanation, Demonstration, Tactile cues, Verbal cues Education comprehension: verbalized understanding, returned demonstration, verbal cues required, tactile cues required, and needs further education    HOME EXERCISE PROGRAM: Access Code: [email or web address] URL: [email or web address] Date: 05/09/2023 Prepared by: Mayme Spearman  Exercises - Seated Elbow Flexion AAROM  - 2-3 x daily - 7 x weekly - 1 sets - 10 reps - Seated AAROM Elbow Supination/Pronation with  Clasped Hands  - 2-3 x daily - 7 x weekly - 1 sets - 10 reps - Seated Gripping Towel  - 2-3 x daily - 7 x weekly - 1 sets - 10 reps  ASSESSMENT:  CLINICAL IMPRESSION: Patient progressing well with AAROM/AROM. Continued with shoulder and periscap strengthening which is tolerated well. Overall patient is progressing well. Patient will continue to benefit from physical therapy in order to improve function and reduce impairment.     OBJECTIVE IMPAIRMENTS: decreased activity tolerance, decreased mobility, decreased ROM, decreased strength, impaired perceived functional ability, impaired UE functional use, postural dysfunction, and pain.   ACTIVITY LIMITATIONS: carrying, lifting, sleeping, bathing, dressing, reach over head, and hygiene/grooming  PARTICIPATION LIMITATIONS: meal prep, cleaning, laundry, driving, community activity, and occupation  PERSONAL FACTORS: Age and Time since onset of injury/illness/exacerbation are also affecting patient's functional outcome.   REHAB POTENTIAL: Good  CLINICAL DECISION MAKING: Stable/uncomplicated  EVALUATION COMPLEXITY: Low   GOALS:   SHORT TERM GOALS:  Target date: 06/20/2023   Pt will demonstrate appropriate understanding and performance of initially prescribed HEP in order to facilitate improved independence with management of symptoms.  Baseline: HEP established  Goal status: MET 5/15   2. Pt will report at least 25% improvement in overall pain levels over past week in order to facilitate improved tolerance to typical daily activities.   Baseline: 1-7/10  Goal status: INITIAL    LONG TERM GOALS: Target date: 08/01/2023 Pt will score less than or equal to 40% on Quick DASH in order to indicate reduced levels of disability due to shoulder pain (MDC 16-20pts).  Baseline: 75%   Goal status: INITIAL  2.  Pt will demonstrate at least 150 degrees of active shoulder elevation on surgical limb in order to demonstrate improved tolerance to  functional movement patterns such as reaching overhead.  Baseline: see ROM chart above Goal status: INITIAL  3.  Pt will demonstrate at least 4+/5 shoulder flex/abd MMT for improved symmetry of UE strength and improved tolerance to functional movements.  Baseline: deferred on eval given surgical protocol Goal status: INITIAL  4. Pt will report ability to perform upper body dressing w/ less than 2 pt increase in pain in order to facilitate improved tolerance to ADLs.  Baseline: 1-7/10 pain with usual activities, inc time/difficulty performing in clinic  Goal status: INITIAL   5. Pt will report at least 50% decrease in overall pain levels in past week in order to facilitate improved tolerance to basic ADLs/mobility.   Baseline: 1-7/10  Goal status: INITIAL   6. Pt will endorse ability to perform usual household tasks with no more than 2 pt increase in pain in order to return to PLOF.   Baseline: housework limited as expected post op Goal status: INITIAL   PLAN:  PT FREQUENCY: 1-2x/week  PT DURATION: 12 weeks  PLANNED INTERVENTIONS: 97164- PT Re-evaluation, 97110-Therapeutic exercises, 97530- Therapeutic activity, 97112- Neuromuscular re-education, 97535- Self Care, 24401- Manual therapy, G0283- Electrical stimulation (unattended), Patient/Family education, Taping, Dry Needling, Joint mobilization, Spinal mobilization, Scar mobilization, Cryotherapy, and Moist heat  PLAN FOR NEXT SESSION: continue to progress as able/appropriate per Bokshan shoulder labral repair protocol   Perfecto Bracket Odis Wickey, PT 06/25/2023, 7:20 AM

## 2023-06-27 ENCOUNTER — Ambulatory Visit (INDEPENDENT_AMBULATORY_CARE_PROVIDER_SITE_OTHER): Admitting: Orthopaedic Surgery

## 2023-06-27 ENCOUNTER — Ambulatory Visit (HOSPITAL_BASED_OUTPATIENT_CLINIC_OR_DEPARTMENT_OTHER): Admitting: Physical Therapy

## 2023-06-27 ENCOUNTER — Encounter (HOSPITAL_BASED_OUTPATIENT_CLINIC_OR_DEPARTMENT_OTHER): Payer: Self-pay | Admitting: Physical Therapy

## 2023-06-27 DIAGNOSIS — M25512 Pain in left shoulder: Secondary | ICD-10-CM

## 2023-06-27 DIAGNOSIS — M25312 Other instability, left shoulder: Secondary | ICD-10-CM

## 2023-06-27 DIAGNOSIS — M6281 Muscle weakness (generalized): Secondary | ICD-10-CM

## 2023-06-27 DIAGNOSIS — M25612 Stiffness of left shoulder, not elsewhere classified: Secondary | ICD-10-CM | POA: Diagnosis not present

## 2023-06-27 NOTE — Therapy (Signed)
 OUTPATIENT PHYSICAL THERAPY SHOULDER TREATMENT   Patient Name: Seth Shields MRN: [id number] DOB:01-Jul-1990, 33 y.o., male Today's Date: 06/27/2023  END OF SESSION:  PT End of Session - 06/27/23 1145     Visit Number 11    Number of Visits 25    Date for PT Re-Evaluation 08/01/23    Authorization Type BCBS    PT Start Time 1145    PT Stop Time 1225    PT Time Calculation (min) 40 min    Activity Tolerance Patient tolerated treatment well    Behavior During Therapy WFL for tasks assessed/performed                Past Medical History:  Diagnosis Date   BPH (benign prostatic hyperplasia)    Complication of anesthesia    woke up aggressive after hernia surgery   Inguinal hernia    Labral tear of shoulder    Past Surgical History:  Procedure Laterality Date   HERNIA REPAIR     SHOULDER ARTHROSCOPY WITH LABRAL REPAIR Left 05/07/2023   Procedure: ARTHROSCOPY, SHOULDER, WITH GLENOID LABRUM REPAIR;  Surgeon: Wilhelmenia Harada, MD;  Location: [facility];  Service: Orthopedics;  Laterality: Left;   Patient Active Problem List   Diagnosis Date Noted   Instability of left shoulder joint 05/07/2023   Chronic left shoulder pain 03/26/2023   Solitary pulmonary nodule 10/02/2018   Dyspnea 10/02/2018   Scoliosis deformity of spine 09/21/2017    PCP: Alexander Iba, PA  REFERRING PROVIDER: Wilhelmenia Harada, MD  REFERRING DIAG: [phone] (ICD-10-CM) - Instability of left shoulder joint  THERAPY DIAG:  Left shoulder pain, unspecified chronicity  Stiffness of left shoulder, not elsewhere classified  Muscle weakness (generalized)  Rationale for Evaluation and Treatment: Rehabilitation  ONSET DATE: L shoulder labral repair 05/07/23  SUBJECTIVE:                                                                                                                                                                                      SUBJECTIVE STATEMENT: Pt states MD said  everything doing well. Some soreness in periscap musculature.     Hand dominance: Right  PERTINENT HISTORY: BPH, L labral repair DOS 05/07/23  PAIN:  Are you having pain: 1/10 stiff Location/description: L anterolateral shoulder  Best-worst over past week: 1-7/10  - aggravating factors: general movement/activity  - Easing factors: medication, icing    PRECAUTIONS: L shoulder Bokshan labral repair protocol  RED FLAGS: None   WEIGHT BEARING RESTRICTIONS: Yes NWB  FALLS:  Has patient fallen in last 6 months? No  LIVING ENVIRONMENT: 1 story home + basement w/ spouse who works  Pt  usually does most of cooking, otherwise split housework Has two dogs, beagle and mini aussie   OCCUPATION: Attorney full time  PLOF: Independent - enjoys MMA, going to gym, walk dogs  PATIENT GOALS: get back to PLOF, full mobility, get back in gym   NEXT MD VISIT: 05/29/23  OBJECTIVE:  Note: Objective measures were completed at Evaluation unless otherwise noted.  DIAGNOSTIC FINDINGS:  DOS 05/07/23 labral repair L shoulder  PATIENT SURVEYS:  Quick Dash 75%  COGNITION: Overall cognitive status: Within functional limits for tasks assessed     SENSATION: Denies sensory complaints today  POSTURE: Sling donned ; guarded LUE posture as expected post op  UPPER EXTREMITY ROM:  Passive ROM  Right eval Left eval Left AROM 06/06/23  Shoulder flexion  P: <25 deg limited by muscle guarding 130 improves to 150 154 AAROM  Shoulder abduction  P: ~30 deg limited by muscle guarding     Shoulder internal rotation     Shoulder external rotation (at side unless otherwise noted)     Elbow flexion     Elbow extension     Wrist flexion     Wrist extension      (Blank rows = not tested) (Key: WFL = within functional limits not formally assessed, * = concordant pain, s = stiffness/stretching sensation, NT = not tested)  Comments:   UPPER EXTREMITY MMT:  MMT Right eval Left eval  Shoulder flexion     Shoulder extension    Shoulder abduction    Shoulder adduction    Shoulder internal rotation    Shoulder external rotation    Middle trapezius    Lower trapezius    Elbow flexion    Elbow extension    Wrist flexion    Wrist extension    Wrist ulnar deviation    Wrist radial deviation    Wrist pronation    Wrist supination    Grip strength (lbs)    (Blank rows = not tested) (Key: WFL = within functional limits not formally assessed, * = concordant pain, s = stiffness/stretching sensation, NT = not tested)  Comments: deferred on eval given post op protocol and acuity of surgery   PALPATION/OBSERVATION:  3 portal incisions (anterior, lateral, and posterior) appear grossly WNL, mild bleeding apparent anterior incision but not excessive. No significant erythema or swelling apparent, no excessive warmth.  Able to doff sling w/o assist, inc time/effort and guarding noted of LUE Min assist to don sling Able to perform upper body dressing for wound inspection w/o assist, inc time/effort noted and guarding LUE                                                                                                                              TREATMENT DATE:  06/27/23 TTP L deltoid Manual: STM to L deltoid pre and post dry needling for trigger point identification and muscular relaxation. Trigger Point Dry Needling  Initial Treatment: Pt instructed on  Dry Needling rational, procedures, and possible side effects. Pt instructed to expect mild to moderate muscle soreness later in the day and/or into the next day.  Pt instructed in methods to reduce muscle soreness. Pt instructed to continue prescribed HEP. Patient was educated on signs and symptoms of infection and other risk factors and advised to seek medical attention should they occur.  Patient verbalized understanding of these instructions and education.   Patient Verbal Consent Given: Yes Education Handout Provided: Yes Muscles Treated: L  deltoid Electrical Stimulation Performed: No Treatment Response/Outcome: twitch response, decrease in tissue tension, improvement in AROM  Standing shoulder flexion with loop band RTB 3 x 10 Wall clock RTB 2 x 5 Band ER/IR RTB 2 x 15 Standing ABC at wall with ball 2x   06/25/23 Pulley 2 minutes flexion Manual:STM to L pec, Grade II-III inferior glide in progressive flexion  Supine Shoulder AAROM flexion with 3# bar 2 x 10 Supine ABC 2# x2 Sidelying ER 2# 3x10 Supine shoulder PNF D2 RTB 2 x 10 Standing shoulder abduction with perpendicular resistance RTB 2 x 10 Standing shoulder flexion with perpendicular resistance RTB 2 x 10 Standing shoulder flexion with loop band RTB 2 x 10  5/15 STM to bil UT PROM L shoulder Supine AAROM flexion 2# bar Supine active flexion 2x10 Supine ABC 1# x1 Sidelying ER 1# x10, 2# 2x10 Seated bil ER RTB 2x10 4way ball rolls at wall 2.2# ball x20ea   06/14/23 Manual:STM to R UT Supine Shoulder AAROM flexion with 2# bar 2 x 10 Supine ABC 1# x 2  Sidelying ER 1# 2 x 10 Standing bilateral ER with scap retraction RTB 2 x 10 Standing shoulder horizontal abduction RTB 2 x 10  Standing row BTB 2 x 15 Standing shoulder extension BTB 2 x 10  06/12/23 Manual:STM to R UT, manual UT stretch;  Grade II-III inferior glide in progressive flexion and abduction, pin and stretch L lat Supine AAROM flexion 2 x 10 Supine Shoulder AAROM flexion with 2# bar 2 x 10 Bent shoulder extension 1# 2 x 10 Bent shoulder horizontal abduction 1# 2 x 10 Bent row 1# 2 x 10  06/06/23 Manual: Grade II-III inferior glide in progressive flexion and abduction, Grade II-III AP glide in progressive ER Supine Shoulder AAROM flexion with cane 2 x 10 Pulley flexion 2 minutes, scaption 2 minutes Prone shoulder extension 2 x 10 Prone shoulder horizontal abduction 2 x 10 Sidelying ER 2 x 10 Standing row RTB 2 x 15 Standing shoulder extension RTB 2 x 10  06/04/23 Manual: Grade II-III  inferior glide in progressive flexion, Grade II-III AP glide in progressive ER Supine Shoulder AAROM flexion with cane 2 x 10 Supine shoulder AAROM ER with cane 2 x 10 Standing row RTB 3 x 15 Prone shoulder extension 2 x 10 Sidelying ER 2 x 10 PROM L shoulder     PATIENT EDUCATION: Education details: Pt education on PT impairments, prognosis, and POC. Informed consent. Rationale for interventions, safe/appropriate HEP performance, post op precautions and self care as above Person educated: Patient Education method: Explanation, Demonstration, Tactile cues, Verbal cues Education comprehension: verbalized understanding, returned demonstration, verbal cues required, tactile cues required, and needs further education    HOME EXERCISE PROGRAM: Access Code: [email or web address] URL: [email or web address] Date: 05/09/2023 Prepared by: Mayme Spearman  Exercises - Seated Elbow Flexion AAROM  - 2-3 x daily - 7 x weekly - 1 sets - 10 reps - Seated AAROM Elbow Supination/Pronation with Clasped Hands  -  2-3 x daily - 7 x weekly - 1 sets - 10 reps - Seated Gripping Towel  - 2-3 x daily - 7 x weekly - 1 sets - 10 reps  ASSESSMENT:  CLINICAL IMPRESSION: Tender and hyperactive delt with trigger points. Twitch response and decrease in tension following DN. Continued with shoulder strengthening which is tolerated well. Patient will continue to benefit from physical therapy in order to improve function and reduce impairment.     OBJECTIVE IMPAIRMENTS: decreased activity tolerance, decreased mobility, decreased ROM, decreased strength, impaired perceived functional ability, impaired UE functional use, postural dysfunction, and pain.   ACTIVITY LIMITATIONS: carrying, lifting, sleeping, bathing, dressing, reach over head, and hygiene/grooming  PARTICIPATION LIMITATIONS: meal prep, cleaning, laundry, driving, community activity, and occupation  PERSONAL FACTORS: Age and Time since onset of  injury/illness/exacerbation are also affecting patient's functional outcome.   REHAB POTENTIAL: Good  CLINICAL DECISION MAKING: Stable/uncomplicated  EVALUATION COMPLEXITY: Low   GOALS:   SHORT TERM GOALS: Target date: 06/20/2023   Pt will demonstrate appropriate understanding and performance of initially prescribed HEP in order to facilitate improved independence with management of symptoms.  Baseline: HEP established  Goal status: MET 5/15   2. Pt will report at least 25% improvement in overall pain levels over past week in order to facilitate improved tolerance to typical daily activities.   Baseline: 1-7/10  Goal status: INITIAL    LONG TERM GOALS: Target date: 08/01/2023 Pt will score less than or equal to 40% on Quick DASH in order to indicate reduced levels of disability due to shoulder pain (MDC 16-20pts).  Baseline: 75%   Goal status: INITIAL  2.  Pt will demonstrate at least 150 degrees of active shoulder elevation on surgical limb in order to demonstrate improved tolerance to functional movement patterns such as reaching overhead.  Baseline: see ROM chart above Goal status: INITIAL  3.  Pt will demonstrate at least 4+/5 shoulder flex/abd MMT for improved symmetry of UE strength and improved tolerance to functional movements.  Baseline: deferred on eval given surgical protocol Goal status: INITIAL  4. Pt will report ability to perform upper body dressing w/ less than 2 pt increase in pain in order to facilitate improved tolerance to ADLs.  Baseline: 1-7/10 pain with usual activities, inc time/difficulty performing in clinic  Goal status: INITIAL   5. Pt will report at least 50% decrease in overall pain levels in past week in order to facilitate improved tolerance to basic ADLs/mobility.   Baseline: 1-7/10  Goal status: INITIAL   6. Pt will endorse ability to perform usual household tasks with no more than 2 pt increase in pain in order to return to PLOF.    Baseline: housework limited as expected post op Goal status: INITIAL   PLAN:  PT FREQUENCY: 1-2x/week  PT DURATION: 12 weeks  PLANNED INTERVENTIONS: 97164- PT Re-evaluation, 97110-Therapeutic exercises, 97530- Therapeutic activity, 97112- Neuromuscular re-education, 97535- Self Care, 40981- Manual therapy, [id number]- Electrical stimulation (unattended), Patient/Family education, Taping, Dry Needling, Joint mobilization, Spinal mobilization, Scar mobilization, Cryotherapy, and Moist heat  PLAN FOR NEXT SESSION: continue to progress as able/appropriate per Bokshan shoulder labral repair protocol   Beather Liming, PT, DPT 06/27/2023, 11:45 AM

## 2023-06-27 NOTE — Progress Notes (Signed)
 Post Operative Evaluation    Procedure/Date of Surgery: Left shoulder arthroscopy with labral repair 4/1  Interval History:  Presents today 2 weeks status post the above procedure.  Overall he is doing extremely well.  Has been working with physical therapy.  Denies any significant pain   PMH/PSH/Family History/Social History/Meds/Allergies:    Past Medical History:  Diagnosis Date   BPH (benign prostatic hyperplasia)    Complication of anesthesia    woke up aggressive after hernia surgery   Inguinal hernia    Labral tear of shoulder    Past Surgical History:  Procedure Laterality Date   HERNIA REPAIR     SHOULDER ARTHROSCOPY WITH LABRAL REPAIR Left 05/07/2023   Procedure: ARTHROSCOPY, SHOULDER, WITH GLENOID LABRUM REPAIR;  Surgeon: Wilhelmenia Harada, MD;  Location: [facility];  Service: Orthopedics;  Laterality: Left;   Social History   Socioeconomic History   Marital status: Married    Spouse name: Carolynne Citron   Number of children: Not on file   Years of education: Not on file   Highest education level: Not on file  Occupational History   Not on file  Tobacco Use   Smoking status: Former    Current packs/day: 0.00    Average packs/day: 0.5 packs/day for 0.7 years (0.4 ttl pk-yrs)    Types: Cigarettes    Start date: 06/30/2017    Quit date: 03/31/2018    Years since quitting: 5.2   Smokeless tobacco: Never  Vaping Use   Vaping status: Never Used  Substance and Sexual Activity   Alcohol use: Yes    Comment: 6/wk Beer and wine   Drug use: Never   Sexual activity: Yes  Other Topics Concern   Not on file  Social History Narrative   Not on file   Social Drivers of Health   Financial Resource Strain: Not on file  Food Insecurity: Not on file  Transportation Needs: Not on file  Physical Activity: Not on file  Stress: Not on file  Social Connections: Not on file   Family History  Problem Relation Age of Onset    Depression Sister        Manic Depression   Heart disease Brother    Liver cancer Paternal Grandmother        ?lung cancer   Liver cancer Paternal Grandfather    No Known Allergies Current Outpatient Medications  Medication Sig Dispense Refill   Amphetamine ER (ADZENYS XR-ODT) 18.8 MG TBED Take by mouth daily.     Biotin 1 MG CAPS Take by mouth.     Glucosamine-Chondroitin (GLUCOSAMINE CHONDR COMPLEX PO) Take 1 tablet by mouth daily in the afternoon.     Multiple Vitamin (MULTIVITAMIN) tablet Take 1 tablet by mouth daily.     tamsulosin (FLOMAX) 0.4 MG CAPS capsule Take 0.4 mg by mouth.     No current facility-administered medications for this visit.   No results found.  Review of Systems:   A ROS was performed including pertinent positives and negatives as documented in the HPI.   Musculoskeletal Exam:    There were no vitals taken for this visit.  Left shoulder is well-appearing.  Incisions are without redness or erythema active forward elevation is 150 degrees without pain equal to the contralateral side external rotation at the side is to  45 degrees nearly equal to the contralateral and internal rotation is to L1 internal rotation deferred today distal neurosensory exam is intact  Imaging:      I personally reviewed and interpreted the radiographs.   Assessment:   6-week status post left shoulder labral repair overall doing very well.  Overhead range of motion is improving.  He does feel very stable.  I will plan to see him back in 6 weeks for reassessment  Plan :    - Return to clinic 6 weeks for reassessment      I personally saw and evaluated the patient, and participated in the management and treatment plan.  Wilhelmenia Harada, MD Attending Physician, Orthopedic Surgery  This document was dictated using Dragon voice recognition software. A reasonable attempt at proof reading has been made to minimize errors.

## 2023-07-09 ENCOUNTER — Encounter (HOSPITAL_BASED_OUTPATIENT_CLINIC_OR_DEPARTMENT_OTHER): Payer: Self-pay | Admitting: Physical Therapy

## 2023-07-09 ENCOUNTER — Ambulatory Visit (HOSPITAL_BASED_OUTPATIENT_CLINIC_OR_DEPARTMENT_OTHER): Attending: Orthopaedic Surgery | Admitting: Physical Therapy

## 2023-07-09 DIAGNOSIS — M25612 Stiffness of left shoulder, not elsewhere classified: Secondary | ICD-10-CM | POA: Diagnosis present

## 2023-07-09 DIAGNOSIS — M25512 Pain in left shoulder: Secondary | ICD-10-CM | POA: Diagnosis present

## 2023-07-09 DIAGNOSIS — M6281 Muscle weakness (generalized): Secondary | ICD-10-CM | POA: Diagnosis present

## 2023-07-09 NOTE — Therapy (Signed)
 OUTPATIENT PHYSICAL THERAPY SHOULDER TREATMENT   Patient Name: Seth Shields MRN: [id number] DOB:02-22-1990, 33 y.o., male Today's Date: 07/09/2023  END OF SESSION:  PT End of Session - 07/09/23 0718     Visit Number 12    Number of Visits 25    Date for PT Re-Evaluation 08/01/23    Authorization Type BCBS    PT Start Time 0717    PT Stop Time 0757    PT Time Calculation (min) 40 min    Activity Tolerance Patient tolerated treatment well    Behavior During Therapy WFL for tasks assessed/performed                Past Medical History:  Diagnosis Date   BPH (benign prostatic hyperplasia)    Complication of anesthesia    woke up aggressive after hernia surgery   Inguinal hernia    Labral tear of shoulder    Past Surgical History:  Procedure Laterality Date   HERNIA REPAIR     SHOULDER ARTHROSCOPY WITH LABRAL REPAIR Left 05/07/2023   Procedure: ARTHROSCOPY, SHOULDER, WITH GLENOID LABRUM REPAIR;  Surgeon: Wilhelmenia Harada, MD;  Location: [facility];  Service: Orthopedics;  Laterality: Left;   Patient Active Problem List   Diagnosis Date Noted   Instability of left shoulder joint 05/07/2023   Chronic left shoulder pain 03/26/2023   Solitary pulmonary nodule 10/02/2018   Dyspnea 10/02/2018   Scoliosis deformity of spine 09/21/2017    PCP: Alexander Iba, PA  REFERRING PROVIDER: Wilhelmenia Harada, MD  REFERRING DIAG: [phone] (ICD-10-CM) - Instability of left shoulder joint  THERAPY DIAG:  Left shoulder pain, unspecified chronicity  Stiffness of left shoulder, not elsewhere classified  Muscle weakness (generalized)  Rationale for Evaluation and Treatment: Rehabilitation  ONSET DATE: L shoulder labral repair 05/07/23  SUBJECTIVE:                                                                                                                                                                                      SUBJECTIVE STATEMENT: Pt states wasn't  able do much of HEP while on vacation. Doing HEP. Had incident with camera strap getting caught on a bike. Still getting knots in deltoid. Felt good after DN stuff.      Hand dominance: Right  PERTINENT HISTORY: BPH, L labral repair DOS 05/07/23  PAIN:  Are you having pain: 1/10 stiff Location/description: L anterolateral shoulder  Best-worst over past week: 1-7/10  - aggravating factors: general movement/activity  - Easing factors: medication, icing    PRECAUTIONS: L shoulder Bokshan labral repair protocol  RED FLAGS: None   WEIGHT BEARING RESTRICTIONS: Yes NWB  FALLS:  Has patient fallen in last 6 months? No  LIVING ENVIRONMENT: 1 story home + basement w/ spouse who works  Pt usually does most of cooking, otherwise split housework Has two dogs, beagle and mini aussie   OCCUPATION: Attorney full time  PLOF: Independent - enjoys MMA, going to gym, walk dogs  PATIENT GOALS: get back to PLOF, full mobility, get back in gym   NEXT MD VISIT: 05/29/23  OBJECTIVE:  Note: Objective measures were completed at Evaluation unless otherwise noted.  DIAGNOSTIC FINDINGS:  DOS 05/07/23 labral repair L shoulder  PATIENT SURVEYS:  Quick Dash 75%  COGNITION: Overall cognitive status: Within functional limits for tasks assessed     SENSATION: Denies sensory complaints today  POSTURE: Sling donned ; guarded LUE posture as expected post op  UPPER EXTREMITY ROM:  Passive ROM  Right eval Left eval Left AROM 06/06/23  Shoulder flexion  P: <25 deg limited by muscle guarding 130 improves to 150 154 AAROM  Shoulder abduction  P: ~30 deg limited by muscle guarding     Shoulder internal rotation     Shoulder external rotation (at side unless otherwise noted)     Elbow flexion     Elbow extension     Wrist flexion     Wrist extension      (Blank rows = not tested) (Key: WFL = within functional limits not formally assessed, * = concordant pain, s = stiffness/stretching sensation,  NT = not tested)  Comments:   UPPER EXTREMITY MMT:  MMT Right eval Left eval  Shoulder flexion    Shoulder extension    Shoulder abduction    Shoulder adduction    Shoulder internal rotation    Shoulder external rotation    Middle trapezius    Lower trapezius    Elbow flexion    Elbow extension    Wrist flexion    Wrist extension    Wrist ulnar deviation    Wrist radial deviation    Wrist pronation    Wrist supination    Grip strength (lbs)    (Blank rows = not tested) (Key: WFL = within functional limits not formally assessed, * = concordant pain, s = stiffness/stretching sensation, NT = not tested)  Comments: deferred on eval given post op protocol and acuity of surgery   PALPATION/OBSERVATION:  3 portal incisions (anterior, lateral, and posterior) appear grossly WNL, mild bleeding apparent anterior incision but not excessive. No significant erythema or swelling apparent, no excessive warmth.  Able to doff sling w/o assist, inc time/effort and guarding noted of LUE Min assist to don sling Able to perform upper body dressing for wound inspection w/o assist, inc time/effort noted and guarding LUE                                                                                                                              TREATMENT DATE:  07/09/23 TTP L deltoid Manual: STM to L deltoid  pre and post dry needling for trigger point identification and muscular relaxation. Trigger Point Dry Needling  Subsequent Treatment: Instructions provided previously at initial dry needling treatment.  Instructions reviewed, if requested by the patient, prior to subsequent dry needling treatment.   Patient Verbal Consent Given: Yes Education Handout Provided: Previously Provided Muscles Treated: L deltoid Electrical Stimulation Performed: No Treatment Response/Outcome: twitch response, decrease in tissue tension  Supine shoulder PNF DN RTB 2 x 10  Standing shoulder flexion 3# 2 x  10    06/27/23 TTP L deltoid Manual: STM to L deltoid pre and post dry needling for trigger point identification and muscular relaxation. Trigger Point Dry Needling  Initial Treatment: Pt instructed on Dry Needling rational, procedures, and possible side effects. Pt instructed to expect mild to moderate muscle soreness later in the day and/or into the next day.  Pt instructed in methods to reduce muscle soreness. Pt instructed to continue prescribed HEP. Patient was educated on signs and symptoms of infection and other risk factors and advised to seek medical attention should they occur.  Patient verbalized understanding of these instructions and education.   Patient Verbal Consent Given: Yes Education Handout Provided: Yes Muscles Treated: L deltoid Electrical Stimulation Performed: No Treatment Response/Outcome: twitch response, decrease in tissue tension, improvement in AROM  Standing shoulder flexion with loop band RTB 3 x 10 Wall clock RTB 2 x 5 Band ER/IR RTB 2 x 15 Standing ABC at wall with ball 2x   06/25/23 Pulley 2 minutes flexion Manual:STM to L pec, Grade II-III inferior glide in progressive flexion  Supine Shoulder AAROM flexion with 3# bar 2 x 10 Supine ABC 2# x2 Sidelying ER 2# 3x10 Supine shoulder PNF D2 RTB 2 x 10 Standing shoulder abduction with perpendicular resistance RTB 2 x 10 Standing shoulder flexion with perpendicular resistance RTB 2 x 10 Standing shoulder flexion with loop band RTB 2 x 10  5/15 STM to bil UT PROM L shoulder Supine AAROM flexion 2# bar Supine active flexion 2x10 Supine ABC 1# x1 Sidelying ER 1# x10, 2# 2x10 Seated bil ER RTB 2x10 4way ball rolls at wall 2.2# ball x20ea   06/14/23 Manual:STM to R UT Supine Shoulder AAROM flexion with 2# bar 2 x 10 Supine ABC 1# x 2  Sidelying ER 1# 2 x 10 Standing bilateral ER with scap retraction RTB 2 x 10 Standing shoulder horizontal abduction RTB 2 x 10  Standing row BTB 2 x  15 Standing shoulder extension BTB 2 x 10  06/12/23 Manual:STM to R UT, manual UT stretch;  Grade II-III inferior glide in progressive flexion and abduction, pin and stretch L lat Supine AAROM flexion 2 x 10 Supine Shoulder AAROM flexion with 2# bar 2 x 10 Bent shoulder extension 1# 2 x 10 Bent shoulder horizontal abduction 1# 2 x 10 Bent row 1# 2 x 10  06/06/23 Manual: Grade II-III inferior glide in progressive flexion and abduction, Grade II-III AP glide in progressive ER Supine Shoulder AAROM flexion with cane 2 x 10 Pulley flexion 2 minutes, scaption 2 minutes Prone shoulder extension 2 x 10 Prone shoulder horizontal abduction 2 x 10 Sidelying ER 2 x 10 Standing row RTB 2 x 15 Standing shoulder extension RTB 2 x 10  06/04/23 Manual: Grade II-III inferior glide in progressive flexion, Grade II-III AP glide in progressive ER Supine Shoulder AAROM flexion with cane 2 x 10 Supine shoulder AAROM ER with cane 2 x 10 Standing row RTB 3 x 15 Prone  shoulder extension 2 x 10 Sidelying ER 2 x 10 PROM L shoulder     PATIENT EDUCATION: Education details: Pt education on PT impairments, prognosis, and POC. Informed consent. Rationale for interventions, safe/appropriate HEP performance, post op precautions and self care as above Person educated: Patient Education method: Explanation, Demonstration, Tactile cues, Verbal cues Education comprehension: verbalized understanding, returned demonstration, verbal cues required, tactile cues required, and needs further education    HOME EXERCISE PROGRAM: Access Code: [email or web address] URL: [email or web address] Date: 05/09/2023 Prepared by: Mayme Spearman  Exercises - Seated Elbow Flexion AAROM  - 2-3 x daily - 7 x weekly - 1 sets - 10 reps - Seated AAROM Elbow Supination/Pronation with Clasped Hands  - 2-3 x daily - 7 x weekly - 1 sets - 10 reps - Seated Gripping Towel  - 2-3 x daily - 7 x weekly - 1 sets - 10  reps  ASSESSMENT:  CLINICAL IMPRESSION: Tender and hyperactive delt with trigger points with several twitch response and decrease in tissue tension with DN.  Cueing for mechanics with shoulder strengthening. Patient will continue to benefit from physical therapy in order to improve function and reduce impairment.     OBJECTIVE IMPAIRMENTS: decreased activity tolerance, decreased mobility, decreased ROM, decreased strength, impaired perceived functional ability, impaired UE functional use, postural dysfunction, and pain.   ACTIVITY LIMITATIONS: carrying, lifting, sleeping, bathing, dressing, reach over head, and hygiene/grooming  PARTICIPATION LIMITATIONS: meal prep, cleaning, laundry, driving, community activity, and occupation  PERSONAL FACTORS: Age and Time since onset of injury/illness/exacerbation are also affecting patient's functional outcome.   REHAB POTENTIAL: Good  CLINICAL DECISION MAKING: Stable/uncomplicated  EVALUATION COMPLEXITY: Low   GOALS:   SHORT TERM GOALS: Target date: 06/20/2023   Pt will demonstrate appropriate understanding and performance of initially prescribed HEP in order to facilitate improved independence with management of symptoms.  Baseline: HEP established  Goal status: MET 5/15   2. Pt will report at least 25% improvement in overall pain levels over past week in order to facilitate improved tolerance to typical daily activities.   Baseline: 1-7/10  Goal status: INITIAL    LONG TERM GOALS: Target date: 08/01/2023 Pt will score less than or equal to 40% on Quick DASH in order to indicate reduced levels of disability due to shoulder pain (MDC 16-20pts).  Baseline: 75%   Goal status: INITIAL  2.  Pt will demonstrate at least 150 degrees of active shoulder elevation on surgical limb in order to demonstrate improved tolerance to functional movement patterns such as reaching overhead.  Baseline: see ROM chart above Goal status: INITIAL  3.  Pt  will demonstrate at least 4+/5 shoulder flex/abd MMT for improved symmetry of UE strength and improved tolerance to functional movements.  Baseline: deferred on eval given surgical protocol Goal status: INITIAL  4. Pt will report ability to perform upper body dressing w/ less than 2 pt increase in pain in order to facilitate improved tolerance to ADLs.  Baseline: 1-7/10 pain with usual activities, inc time/difficulty performing in clinic  Goal status: INITIAL   5. Pt will report at least 50% decrease in overall pain levels in past week in order to facilitate improved tolerance to basic ADLs/mobility.   Baseline: 1-7/10  Goal status: INITIAL   6. Pt will endorse ability to perform usual household tasks with no more than 2 pt increase in pain in order to return to PLOF.   Baseline: housework limited as expected post op Goal status: INITIAL   PLAN:  PT FREQUENCY: 1-2x/week  PT DURATION: 12 weeks  PLANNED INTERVENTIONS: 97164- PT Re-evaluation, 97110-Therapeutic exercises, 97530- Therapeutic activity, 97112- Neuromuscular re-education, 97535- Self Care, 16109- Manual therapy, [id number]- Electrical stimulation (unattended), Patient/Family education, Taping, Dry Needling, Joint mobilization, Spinal mobilization, Scar mobilization, Cryotherapy, and Moist heat  PLAN FOR NEXT SESSION: continue to progress as able/appropriate per Bokshan shoulder labral repair protocol   Beather Liming, PT, DPT 07/09/2023, 7:57 AM

## 2023-07-11 ENCOUNTER — Ambulatory Visit (HOSPITAL_BASED_OUTPATIENT_CLINIC_OR_DEPARTMENT_OTHER): Admitting: Physical Therapy

## 2023-07-11 ENCOUNTER — Encounter (HOSPITAL_BASED_OUTPATIENT_CLINIC_OR_DEPARTMENT_OTHER): Payer: Self-pay | Admitting: Physical Therapy

## 2023-07-11 DIAGNOSIS — M25612 Stiffness of left shoulder, not elsewhere classified: Secondary | ICD-10-CM

## 2023-07-11 DIAGNOSIS — M6281 Muscle weakness (generalized): Secondary | ICD-10-CM

## 2023-07-11 DIAGNOSIS — M25512 Pain in left shoulder: Secondary | ICD-10-CM

## 2023-07-11 NOTE — Therapy (Signed)
 OUTPATIENT PHYSICAL THERAPY SHOULDER TREATMENT   Patient Name: Seth Shields MRN: [id number] DOB:[birth year], 33 y.o., male Today's Date: 07/11/2023  END OF SESSION:  PT End of Session - 07/11/23 0718     Visit Number 13    Number of Visits 25    Date for PT Re-Evaluation 08/01/23    Authorization Type BCBS    PT Start Time 0717    PT Stop Time 0800    PT Time Calculation (min) 43 min    Activity Tolerance Patient tolerated treatment well    Behavior During Therapy WFL for tasks assessed/performed                Past Medical History:  Diagnosis Date   BPH (benign prostatic hyperplasia)    Complication of anesthesia    woke up aggressive after hernia surgery   Inguinal hernia    Labral tear of shoulder    Past Surgical History:  Procedure Laterality Date   HERNIA REPAIR     SHOULDER ARTHROSCOPY WITH LABRAL REPAIR Left 05/07/2023   Procedure: ARTHROSCOPY, SHOULDER, WITH GLENOID LABRUM REPAIR;  Surgeon: Wilhelmenia Harada, MD;  Location: [facility];  Service: Orthopedics;  Laterality: Left;   Patient Active Problem List   Diagnosis Date Noted   Instability of left shoulder joint 05/07/2023   Chronic left shoulder pain 03/26/2023   Solitary pulmonary nodule 10/02/2018   Dyspnea 10/02/2018   Scoliosis deformity of spine 09/21/2017    PCP: Alexander Iba, PA  REFERRING PROVIDER: Wilhelmenia Harada, MD  REFERRING DIAG: [phone] (ICD-10-CM) - Instability of left shoulder joint  THERAPY DIAG:  Left shoulder pain, unspecified chronicity  Stiffness of left shoulder, not elsewhere classified  Muscle weakness (generalized)  Rationale for Evaluation and Treatment: Rehabilitation  ONSET DATE: L shoulder labral repair 05/07/23  SUBJECTIVE:                                                                                                                                                                                      SUBJECTIVE STATEMENT: Pt states shoulder  doing good. Tight spot in front of shoulder. Doing HEP      Hand dominance: Right  PERTINENT HISTORY: BPH, L labral repair DOS 05/07/23  PAIN:  Are you having pain: 1/10 stiff Location/description: L anterolateral shoulder  Best-worst over past week: 1-7/10  - aggravating factors: general movement/activity  - Easing factors: medication, icing    PRECAUTIONS: L shoulder [facility] labral repair protocol  RED FLAGS: None   WEIGHT BEARING RESTRICTIONS: Yes NWB  FALLS:  Has patient fallen in last 6 months? No  LIVING ENVIRONMENT: 1 story home + basement w/ spouse who works  Pt usually does most of cooking, otherwise split housework Has two dogs, beagle and mini aussie   OCCUPATION: Attorney full time  PLOF: Independent - enjoys MMA, going to gym, walk dogs  PATIENT GOALS: get back to PLOF, full mobility, get back in gym   NEXT MD VISIT: 05/29/23  OBJECTIVE:  Note: Objective measures were completed at Evaluation unless otherwise noted.  DIAGNOSTIC FINDINGS:  DOS 05/07/23 labral repair L shoulder  PATIENT SURVEYS:  Quick Dash 75%  COGNITION: Overall cognitive status: Within functional limits for tasks assessed     SENSATION: Denies sensory complaints today  POSTURE: Sling donned ; guarded LUE posture as expected post op  UPPER EXTREMITY ROM:  Passive ROM  Right eval Left eval Left AROM 06/06/23  Shoulder flexion  P: <25 deg limited by muscle guarding 130 improves to 150 154 AAROM  Shoulder abduction  P: ~30 deg limited by muscle guarding     Shoulder internal rotation     Shoulder external rotation (at side unless otherwise noted)     Elbow flexion     Elbow extension     Wrist flexion     Wrist extension      (Blank rows = not tested) (Key: WFL = within functional limits not formally assessed, * = concordant pain, s = stiffness/stretching sensation, NT = not tested)  Comments:   UPPER EXTREMITY MMT:  MMT Right eval Left eval  Shoulder flexion     Shoulder extension    Shoulder abduction    Shoulder adduction    Shoulder internal rotation    Shoulder external rotation    Middle trapezius    Lower trapezius    Elbow flexion    Elbow extension    Wrist flexion    Wrist extension    Wrist ulnar deviation    Wrist radial deviation    Wrist pronation    Wrist supination    Grip strength (lbs)    (Blank rows = not tested) (Key: WFL = within functional limits not formally assessed, * = concordant pain, s = stiffness/stretching sensation, NT = not tested)  Comments: deferred on eval given post op protocol and acuity of surgery   PALPATION/OBSERVATION:  3 portal incisions (anterior, lateral, and posterior) appear grossly WNL, mild bleeding apparent anterior incision but not excessive. No significant erythema or swelling apparent, no excessive warmth.  Able to doff sling w/o assist, inc time/effort and guarding noted of LUE Min assist to don sling Able to perform upper body dressing for wound inspection w/o assist, inc time/effort noted and guarding LUE                                                                                                                              TREATMENT DATE:  07/11/23 Manual:STM to Grade II-III inferior glide in progressive flexion/abduction  Standing horizontal abduction BTB 2 x 10  Supine shoulder PNF BTB 2 x 10  Cat cow 1  x 10 Quadruped rotation 1 x 10 Prone horizontal abduction 2# 2 x 10 Prone shoulder flexion 2# 2 x 10  Wall clock RTB 1 x 10 Bicep curls 5# 2 x 15  07/09/23 TTP L deltoid Manual: STM to L deltoid pre and post dry needling for trigger point identification and muscular relaxation. Trigger Point Dry Needling  Subsequent Treatment: Instructions provided previously at initial dry needling treatment.  Instructions reviewed, if requested by the patient, prior to subsequent dry needling treatment.   Patient Verbal Consent Given: Yes Education Handout Provided: Previously  Provided Muscles Treated: L deltoid Electrical Stimulation Performed: No Treatment Response/Outcome: twitch response, decrease in tissue tension  Supine shoulder PNF DN RTB 2 x 10  Standing shoulder flexion 3# 2 x 10    06/27/23 TTP L deltoid Manual: STM to L deltoid pre and post dry needling for trigger point identification and muscular relaxation. Trigger Point Dry Needling  Initial Treatment: Pt instructed on Dry Needling rational, procedures, and possible side effects. Pt instructed to expect mild to moderate muscle soreness later in the day and/or into the next day.  Pt instructed in methods to reduce muscle soreness. Pt instructed to continue prescribed HEP. Patient was educated on signs and symptoms of infection and other risk factors and advised to seek medical attention should they occur.  Patient verbalized understanding of these instructions and education.   Patient Verbal Consent Given: Yes Education Handout Provided: Yes Muscles Treated: L deltoid Electrical Stimulation Performed: No Treatment Response/Outcome: twitch response, decrease in tissue tension, improvement in AROM  Standing shoulder flexion with loop band RTB 3 x 10 Wall clock RTB 2 x 5 Band ER/IR RTB 2 x 15 Standing ABC at wall with ball 2x   06/25/23 Pulley 2 minutes flexion Manual:STM to L pec, Grade II-III inferior glide in progressive flexion  Supine Shoulder AAROM flexion with 3# bar 2 x 10 Supine ABC 2# x2 Sidelying ER 2# 3x10 Supine shoulder PNF D2 RTB 2 x 10 Standing shoulder abduction with perpendicular resistance RTB 2 x 10 Standing shoulder flexion with perpendicular resistance RTB 2 x 10 Standing shoulder flexion with loop band RTB 2 x 10  5/15 STM to bil UT PROM L shoulder Supine AAROM flexion 2# bar Supine active flexion 2x10 Supine ABC 1# x1 Sidelying ER 1# x10, 2# 2x10 Seated bil ER RTB 2x10 4way ball rolls at wall 2.2# ball x20ea   06/14/23 Manual:STM to R UT Supine  Shoulder AAROM flexion with 2# bar 2 x 10 Supine ABC 1# x 2  Sidelying ER 1# 2 x 10 Standing bilateral ER with scap retraction RTB 2 x 10 Standing shoulder horizontal abduction RTB 2 x 10  Standing row BTB 2 x 15 Standing shoulder extension BTB 2 x 10  06/12/23 Manual:STM to R UT, manual UT stretch;  Grade II-III inferior glide in progressive flexion and abduction, pin and stretch L lat Supine AAROM flexion 2 x 10 Supine Shoulder AAROM flexion with 2# bar 2 x 10 Bent shoulder extension 1# 2 x 10 Bent shoulder horizontal abduction 1# 2 x 10 Bent row 1# 2 x 10  06/06/23 Manual: Grade II-III inferior glide in progressive flexion and abduction, Grade II-III AP glide in progressive ER Supine Shoulder AAROM flexion with cane 2 x 10 Pulley flexion 2 minutes, scaption 2 minutes Prone shoulder extension 2 x 10 Prone shoulder horizontal abduction 2 x 10 Sidelying ER 2 x 10 Standing row RTB 2 x 15 Standing shoulder extension RTB 2  x 10  06/04/23 Manual: Grade II-III inferior glide in progressive flexion, Grade II-III AP glide in progressive ER Supine Shoulder AAROM flexion with cane 2 x 10 Supine shoulder AAROM ER with cane 2 x 10 Standing row RTB 3 x 15 Prone shoulder extension 2 x 10 Sidelying ER 2 x 10 PROM L shoulder     PATIENT EDUCATION: Education details: Pt education on PT impairments, prognosis, and POC. Informed consent. Rationale for interventions, safe/appropriate HEP performance, post op precautions and self care as above Person educated: Patient Education method: Explanation, Demonstration, Tactile cues, Verbal cues Education comprehension: verbalized understanding, returned demonstration, verbal cues required, tactile cues required, and needs further education    HOME EXERCISE PROGRAM: Access Code: [email or web address] URL: [email or web address] Date: 05/09/2023 Prepared by: Mayme Spearman  Exercises - Seated Elbow Flexion AAROM  - 2-3 x daily - 7 x weekly - 1 sets  - 10 reps - Seated AAROM Elbow Supination/Pronation with Clasped Hands  - 2-3 x daily - 7 x weekly - 1 sets - 10 reps - Seated Gripping Towel  - 2-3 x daily - 7 x weekly - 1 sets - 10 reps  ASSESSMENT:  CLINICAL IMPRESSION: Continued with manual for shoulder mobility and mechanics. Progressive strengthening tolerated well with emphasis on RC/periscap strength. Some rib flair with overhead mobility, began thoracic mobility exercises. Patient will continue to benefit from physical therapy in order to improve function and reduce impairment.     OBJECTIVE IMPAIRMENTS: decreased activity tolerance, decreased mobility, decreased ROM, decreased strength, impaired perceived functional ability, impaired UE functional use, postural dysfunction, and pain.   ACTIVITY LIMITATIONS: carrying, lifting, sleeping, bathing, dressing, reach over head, and hygiene/grooming  PARTICIPATION LIMITATIONS: meal prep, cleaning, laundry, driving, community activity, and occupation  PERSONAL FACTORS: Age and Time since onset of injury/illness/exacerbation are also affecting patient's functional outcome.   REHAB POTENTIAL: Good  CLINICAL DECISION MAKING: Stable/uncomplicated  EVALUATION COMPLEXITY: Low   GOALS:   SHORT TERM GOALS: Target date: 06/20/2023   Pt will demonstrate appropriate understanding and performance of initially prescribed HEP in order to facilitate improved independence with management of symptoms.  Baseline: HEP established  Goal status: MET 5/15   2. Pt will report at least 25% improvement in overall pain levels over past week in order to facilitate improved tolerance to typical daily activities.   Baseline: 1-7/10  Goal status: INITIAL    LONG TERM GOALS: Target date: 08/01/2023 Pt will score less than or equal to 40% on Quick DASH in order to indicate reduced levels of disability due to shoulder pain (MDC 16-20pts).  Baseline: 75%   Goal status: INITIAL  2.  Pt will demonstrate at  least 150 degrees of active shoulder elevation on surgical limb in order to demonstrate improved tolerance to functional movement patterns such as reaching overhead.  Baseline: see ROM chart above Goal status: INITIAL  3.  Pt will demonstrate at least 4+/5 shoulder flex/abd MMT for improved symmetry of UE strength and improved tolerance to functional movements.  Baseline: deferred on eval given surgical protocol Goal status: INITIAL  4. Pt will report ability to perform upper body dressing w/ less than 2 pt increase in pain in order to facilitate improved tolerance to ADLs.  Baseline: 1-7/10 pain with usual activities, inc time/difficulty performing in clinic  Goal status: INITIAL   5. Pt will report at least 50% decrease in overall pain levels in past week in order to facilitate improved tolerance to basic ADLs/mobility.   Baseline: 1-7/10  Goal status: INITIAL   6. Pt will endorse ability to perform usual household tasks with no more than 2 pt increase in pain in order to return to PLOF.   Baseline: housework limited as expected post op Goal status: INITIAL   PLAN:  PT FREQUENCY: 1-2x/week  PT DURATION: 12 weeks  PLANNED INTERVENTIONS: 97164- PT Re-evaluation, 97110-Therapeutic exercises, 97530- Therapeutic activity, 97112- Neuromuscular re-education, 97535- Self Care, [id number]- Manual therapy, [id number]- Electrical stimulation (unattended), Patient/Family education, Taping, Dry Needling, Joint mobilization, Spinal mobilization, Scar mobilization, Cryotherapy, and Moist heat  PLAN FOR NEXT SESSION: continue to progress as able/appropriate per [facility] shoulder labral repair protocol   Perfecto Bracket Garvin Ellena, PT, DPT 07/11/2023, 8:06 AM

## 2023-07-16 ENCOUNTER — Ambulatory Visit (HOSPITAL_BASED_OUTPATIENT_CLINIC_OR_DEPARTMENT_OTHER): Admitting: Physical Therapy

## 2023-07-16 ENCOUNTER — Encounter (HOSPITAL_BASED_OUTPATIENT_CLINIC_OR_DEPARTMENT_OTHER): Payer: Self-pay | Admitting: Physical Therapy

## 2023-07-16 DIAGNOSIS — M25612 Stiffness of left shoulder, not elsewhere classified: Secondary | ICD-10-CM

## 2023-07-16 DIAGNOSIS — M25512 Pain in left shoulder: Secondary | ICD-10-CM

## 2023-07-16 DIAGNOSIS — M6281 Muscle weakness (generalized): Secondary | ICD-10-CM

## 2023-07-16 NOTE — Therapy (Signed)
 OUTPATIENT PHYSICAL THERAPY SHOULDER TREATMENT   Patient Name: Seth Shields MRN: [id number] DOB:1990/07/04, 33 y.o., male Today's Date: 07/16/2023  END OF SESSION:  PT End of Session - 07/16/23 0722     Visit Number 14    Number of Visits 25    Date for PT Re-Evaluation 08/01/23    Authorization Type BCBS    PT Start Time 0720    PT Stop Time 0800    PT Time Calculation (min) 40 min    Activity Tolerance Patient tolerated treatment well    Behavior During Therapy WFL for tasks assessed/performed                Past Medical History:  Diagnosis Date   BPH (benign prostatic hyperplasia)    Complication of anesthesia    woke up aggressive after hernia surgery   Inguinal hernia    Labral tear of shoulder    Past Surgical History:  Procedure Laterality Date   HERNIA REPAIR     SHOULDER ARTHROSCOPY WITH LABRAL REPAIR Left 05/07/2023   Procedure: ARTHROSCOPY, SHOULDER, WITH GLENOID LABRUM REPAIR;  Surgeon: Wilhelmenia Harada, MD;  Location: [facility];  Service: Orthopedics;  Laterality: Left;   Patient Active Problem List   Diagnosis Date Noted   Instability of left shoulder joint 05/07/2023   Chronic left shoulder pain 03/26/2023   Solitary pulmonary nodule 10/02/2018   Dyspnea 10/02/2018   Scoliosis deformity of spine 09/21/2017    PCP: Alexander Iba, PA  REFERRING PROVIDER: Wilhelmenia Harada, MD  REFERRING DIAG: [phone] (ICD-10-CM) - Instability of left shoulder joint  THERAPY DIAG:  Left shoulder pain, unspecified chronicity  Stiffness of left shoulder, not elsewhere classified  Muscle weakness (generalized)  Rationale for Evaluation and Treatment: Rehabilitation  ONSET DATE: L shoulder labral repair 05/07/23  SUBJECTIVE:                                                                                                                                                                                      SUBJECTIVE STATEMENT: Pt states some  upper trap tightness following working at home. HEP going well.      Hand dominance: Right  PERTINENT HISTORY: BPH, L labral repair DOS 05/07/23  PAIN:  Are you having pain: 1/10 stiff Location/description: L anterolateral shoulder  Best-worst over past week: 1-7/10  - aggravating factors: general movement/activity  - Easing factors: medication, icing    PRECAUTIONS: L shoulder Bokshan labral repair protocol  RED FLAGS: None   WEIGHT BEARING RESTRICTIONS: Yes NWB  FALLS:  Has patient fallen in last 6 months? No  LIVING ENVIRONMENT: 1 story home + basement w/ spouse who works  Pt usually does most of cooking, otherwise split housework Has two dogs, beagle and mini aussie   OCCUPATION: Attorney full time  PLOF: Independent - enjoys MMA, going to gym, walk dogs  PATIENT GOALS: get back to PLOF, full mobility, get back in gym   NEXT MD VISIT: 05/29/23  OBJECTIVE:  Note: Objective measures were completed at Evaluation unless otherwise noted.  DIAGNOSTIC FINDINGS:  DOS 05/07/23 labral repair L shoulder  PATIENT SURVEYS:  Quick Dash 75%  COGNITION: Overall cognitive status: Within functional limits for tasks assessed     SENSATION: Denies sensory complaints today  POSTURE: Sling donned ; guarded LUE posture as expected post op  UPPER EXTREMITY ROM:  Passive ROM  Right eval Left eval Left AROM 06/06/23  Shoulder flexion  P: <25 deg limited by muscle guarding 130 improves to 150 154 AAROM  Shoulder abduction  P: ~30 deg limited by muscle guarding     Shoulder internal rotation     Shoulder external rotation (at side unless otherwise noted)     Elbow flexion     Elbow extension     Wrist flexion     Wrist extension      (Blank rows = not tested) (Key: WFL = within functional limits not formally assessed, * = concordant pain, s = stiffness/stretching sensation, NT = not tested)  Comments:   UPPER EXTREMITY MMT:  MMT Right eval Left eval  Shoulder  flexion    Shoulder extension    Shoulder abduction    Shoulder adduction    Shoulder internal rotation    Shoulder external rotation    Middle trapezius    Lower trapezius    Elbow flexion    Elbow extension    Wrist flexion    Wrist extension    Wrist ulnar deviation    Wrist radial deviation    Wrist pronation    Wrist supination    Grip strength (lbs)    (Blank rows = not tested) (Key: WFL = within functional limits not formally assessed, * = concordant pain, s = stiffness/stretching sensation, NT = not tested)  Comments: deferred on eval given post op protocol and acuity of surgery   PALPATION/OBSERVATION:  3 portal incisions (anterior, lateral, and posterior) appear grossly WNL, mild bleeding apparent anterior incision but not excessive. No significant erythema or swelling apparent, no excessive warmth.  Able to doff sling w/o assist, inc time/effort and guarding noted of LUE Min assist to don sling Able to perform upper body dressing for wound inspection w/o assist, inc time/effort noted and guarding LUE                                                                                                                              TREATMENT DATE:  07/16/23 Manual:STM to Grade II-III inferior glide in progressive flexion/abduction Supine shoulder flexion 3# 2 x 10 Cat cow 2 x 10 Quadruped rotation 1 x 10 Quadruped shoulder flexion  2 x 10 Standing shoulder PNF GTB 3 x 10 Standing shoulder flexion 3# 3 x 10 Quadruped shoulder flexion stretch 10 x 5 second holds Bicep curls 7# 3 x 10 Standing ABC at wall with ball x 2  07/11/23 Manual:STM to Grade II-III inferior glide in progressive flexion/abduction  Standing horizontal abduction BTB 2 x 10  Supine shoulder PNF BTB 2 x 10 Cat cow 1 x 10 Quadruped rotation 1 x 10 Prone horizontal abduction 2# 2 x 10 Prone shoulder flexion 2# 2 x 10  Wall clock RTB 1 x 10 Bicep curls 5# 2 x 15  07/09/23 TTP L deltoid Manual: STM to L  deltoid pre and post dry needling for trigger point identification and muscular relaxation. Trigger Point Dry Needling  Subsequent Treatment: Instructions provided previously at initial dry needling treatment.  Instructions reviewed, if requested by the patient, prior to subsequent dry needling treatment.   Patient Verbal Consent Given: Yes Education Handout Provided: Previously Provided Muscles Treated: L deltoid Electrical Stimulation Performed: No Treatment Response/Outcome: twitch response, decrease in tissue tension  Supine shoulder PNF DN RTB 2 x 10  Standing shoulder flexion 3# 2 x 10    06/27/23 TTP L deltoid Manual: STM to L deltoid pre and post dry needling for trigger point identification and muscular relaxation. Trigger Point Dry Needling  Initial Treatment: Pt instructed on Dry Needling rational, procedures, and possible side effects. Pt instructed to expect mild to moderate muscle soreness later in the day and/or into the next day.  Pt instructed in methods to reduce muscle soreness. Pt instructed to continue prescribed HEP. Patient was educated on signs and symptoms of infection and other risk factors and advised to seek medical attention should they occur.  Patient verbalized understanding of these instructions and education.   Patient Verbal Consent Given: Yes Education Handout Provided: Yes Muscles Treated: L deltoid Electrical Stimulation Performed: No Treatment Response/Outcome: twitch response, decrease in tissue tension, improvement in AROM  Standing shoulder flexion with loop band RTB 3 x 10 Wall clock RTB 2 x 5 Band ER/IR RTB 2 x 15 Standing ABC at wall with ball 2x   06/25/23 Pulley 2 minutes flexion Manual:STM to L pec, Grade II-III inferior glide in progressive flexion  Supine Shoulder AAROM flexion with 3# bar 2 x 10 Supine ABC 2# x2 Sidelying ER 2# 3x10 Supine shoulder PNF D2 RTB 2 x 10 Standing shoulder abduction with perpendicular  resistance RTB 2 x 10 Standing shoulder flexion with perpendicular resistance RTB 2 x 10 Standing shoulder flexion with loop band RTB 2 x 10  5/15 STM to bil UT PROM L shoulder Supine AAROM flexion 2# bar Supine active flexion 2x10 Supine ABC 1# x1 Sidelying ER 1# x10, 2# 2x10 Seated bil ER RTB 2x10 4way ball rolls at wall 2.2# ball x20ea   06/14/23 Manual:STM to R UT Supine Shoulder AAROM flexion with 2# bar 2 x 10 Supine ABC 1# x 2  Sidelying ER 1# 2 x 10 Standing bilateral ER with scap retraction RTB 2 x 10 Standing shoulder horizontal abduction RTB 2 x 10  Standing row BTB 2 x 15 Standing shoulder extension BTB 2 x 10  06/12/23 Manual:STM to R UT, manual UT stretch;  Grade II-III inferior glide in progressive flexion and abduction, pin and stretch L lat Supine AAROM flexion 2 x 10 Supine Shoulder AAROM flexion with 2# bar 2 x 10 Bent shoulder extension 1# 2 x 10 Bent shoulder horizontal abduction 1# 2 x 10  Bent row 1# 2 x 10  06/06/23 Manual: Grade II-III inferior glide in progressive flexion and abduction, Grade II-III AP glide in progressive ER Supine Shoulder AAROM flexion with cane 2 x 10 Pulley flexion 2 minutes, scaption 2 minutes Prone shoulder extension 2 x 10 Prone shoulder horizontal abduction 2 x 10 Sidelying ER 2 x 10 Standing row RTB 2 x 15 Standing shoulder extension RTB 2 x 10  06/04/23 Manual: Grade II-III inferior glide in progressive flexion, Grade II-III AP glide in progressive ER Supine Shoulder AAROM flexion with cane 2 x 10 Supine shoulder AAROM ER with cane 2 x 10 Standing row RTB 3 x 15 Prone shoulder extension 2 x 10 Sidelying ER 2 x 10 PROM L shoulder     PATIENT EDUCATION: Education details: Pt education on PT impairments, prognosis, and POC. Informed consent. Rationale for interventions, safe/appropriate HEP performance, post op precautions and self care as above Person educated: Patient Education method: Explanation, Demonstration,  Tactile cues, Verbal cues Education comprehension: verbalized understanding, returned demonstration, verbal cues required, tactile cues required, and needs further education    HOME EXERCISE PROGRAM: Access Code: [email or web address] URL: [email or web address] Date: 05/09/2023 Prepared by: Mayme Spearman  Exercises - Seated Elbow Flexion AAROM  - 2-3 x daily - 7 x weekly - 1 sets - 10 reps - Seated AAROM Elbow Supination/Pronation with Clasped Hands  - 2-3 x daily - 7 x weekly - 1 sets - 10 reps - Seated Gripping Towel  - 2-3 x daily - 7 x weekly - 1 sets - 10 reps  ASSESSMENT:  CLINICAL IMPRESSION: Continued with thoracic mobility and shoulder strengthening with cueing for core activation as needed to improve mechanics. Moderate fatigue at EOS. Patient will continue to benefit from physical therapy in order to improve function and reduce impairment.     OBJECTIVE IMPAIRMENTS: decreased activity tolerance, decreased mobility, decreased ROM, decreased strength, impaired perceived functional ability, impaired UE functional use, postural dysfunction, and pain.   ACTIVITY LIMITATIONS: carrying, lifting, sleeping, bathing, dressing, reach over head, and hygiene/grooming  PARTICIPATION LIMITATIONS: meal prep, cleaning, laundry, driving, community activity, and occupation  PERSONAL FACTORS: Age and Time since onset of injury/illness/exacerbation are also affecting patient's functional outcome.   REHAB POTENTIAL: Good  CLINICAL DECISION MAKING: Stable/uncomplicated  EVALUATION COMPLEXITY: Low   GOALS:   SHORT TERM GOALS: Target date: 06/20/2023   Pt will demonstrate appropriate understanding and performance of initially prescribed HEP in order to facilitate improved independence with management of symptoms.  Baseline: HEP established  Goal status: MET 5/15   2. Pt will report at least 25% improvement in overall pain levels over past week in order to facilitate improved tolerance  to typical daily activities.   Baseline: 1-7/10  Goal status: INITIAL    LONG TERM GOALS: Target date: 08/01/2023 Pt will score less than or equal to 40% on Quick DASH in order to indicate reduced levels of disability due to shoulder pain (MDC 16-20pts).  Baseline: 75%   Goal status: INITIAL  2.  Pt will demonstrate at least 150 degrees of active shoulder elevation on surgical limb in order to demonstrate improved tolerance to functional movement patterns such as reaching overhead.  Baseline: see ROM chart above Goal status: INITIAL  3.  Pt will demonstrate at least 4+/5 shoulder flex/abd MMT for improved symmetry of UE strength and improved tolerance to functional movements.  Baseline: deferred on eval given surgical protocol Goal status: INITIAL  4. Pt will report ability to perform upper body  dressing w/ less than 2 pt increase in pain in order to facilitate improved tolerance to ADLs.  Baseline: 1-7/10 pain with usual activities, inc time/difficulty performing in clinic  Goal status: INITIAL   5. Pt will report at least 50% decrease in overall pain levels in past week in order to facilitate improved tolerance to basic ADLs/mobility.   Baseline: 1-7/10  Goal status: INITIAL   6. Pt will endorse ability to perform usual household tasks with no more than 2 pt increase in pain in order to return to PLOF.   Baseline: housework limited as expected post op Goal status: INITIAL   PLAN:  PT FREQUENCY: 1-2x/week  PT DURATION: 12 weeks  PLANNED INTERVENTIONS: 97164- PT Re-evaluation, 97110-Therapeutic exercises, 97530- Therapeutic activity, 97112- Neuromuscular re-education, 97535- Self Care, 14782- Manual therapy, [id number]- Electrical stimulation (unattended), Patient/Family education, Taping, Dry Needling, Joint mobilization, Spinal mobilization, Scar mobilization, Cryotherapy, and Moist heat  PLAN FOR NEXT SESSION: continue to progress as able/appropriate per B[facility] shoulder labral  repair protocol   Perfecto Bracket Abriana Saltos, PT, DPT 07/16/2023, 7:22 AM

## 2023-07-18 ENCOUNTER — Encounter (HOSPITAL_BASED_OUTPATIENT_CLINIC_OR_DEPARTMENT_OTHER): Payer: Self-pay | Admitting: Physical Therapy

## 2023-07-18 ENCOUNTER — Ambulatory Visit (HOSPITAL_BASED_OUTPATIENT_CLINIC_OR_DEPARTMENT_OTHER): Admitting: Physical Therapy

## 2023-07-18 DIAGNOSIS — M25612 Stiffness of left shoulder, not elsewhere classified: Secondary | ICD-10-CM

## 2023-07-18 DIAGNOSIS — M25512 Pain in left shoulder: Secondary | ICD-10-CM | POA: Diagnosis not present

## 2023-07-18 DIAGNOSIS — M6281 Muscle weakness (generalized): Secondary | ICD-10-CM

## 2023-07-18 NOTE — Therapy (Signed)
 OUTPATIENT PHYSICAL THERAPY SHOULDER TREATMENT   Patient Name: Seth Shields MRN: [id number] DOB:1990-11-03, 33 y.o., male Today's Date: 07/18/2023  END OF SESSION:  PT End of Session - 07/18/23 0719     Visit Number 15    Number of Visits 25    Date for PT Re-Evaluation 08/01/23    Authorization Type BCBS    PT Start Time 0717    PT Stop Time 0757    PT Time Calculation (min) 40 min    Activity Tolerance Patient tolerated treatment well    Behavior During Therapy WFL for tasks assessed/performed             Past Medical History:  Diagnosis Date   BPH (benign prostatic hyperplasia)    Complication of anesthesia    woke up aggressive after hernia surgery   Inguinal hernia    Labral tear of shoulder    Past Surgical History:  Procedure Laterality Date   HERNIA REPAIR     SHOULDER ARTHROSCOPY WITH LABRAL REPAIR Left 05/07/2023   Procedure: ARTHROSCOPY, SHOULDER, WITH GLENOID LABRUM REPAIR;  Surgeon: Wilhelmenia Harada, MD;  Location: [facility];  Service: Orthopedics;  Laterality: Left;   Patient Active Problem List   Diagnosis Date Noted   Instability of left shoulder joint 05/07/2023   Chronic left shoulder pain 03/26/2023   Solitary pulmonary nodule 10/02/2018   Dyspnea 10/02/2018   Scoliosis deformity of spine 09/21/2017    PCP: Alexander Iba, PA  REFERRING PROVIDER: Wilhelmenia Harada, MD  REFERRING DIAG: [phone] (ICD-10-CM) - Instability of left shoulder joint  THERAPY DIAG:  Left shoulder pain, unspecified chronicity  Stiffness of left shoulder, not elsewhere classified  Muscle weakness (generalized)  Rationale for Evaluation and Treatment: Rehabilitation  ONSET DATE: L shoulder labral repair 05/07/23  SUBJECTIVE:                                                                                                                                                                                      SUBJECTIVE STATEMENT: Pt states somewhat  sore after last with mostly muscular fatigue. Didn't get to do band exercises yesterday.    Hand dominance: Right  PERTINENT HISTORY: BPH, L labral repair DOS 05/07/23  PAIN:  Are you having pain: 0/10 stiff Location/description: L anterolateral shoulder  Best-worst over past week: 1-7/10  - aggravating factors: general movement/activity  - Easing factors: medication, icing    PRECAUTIONS: L shoulder Bokshan labral repair protocol  RED FLAGS: None   WEIGHT BEARING RESTRICTIONS: Yes NWB  FALLS:  Has patient fallen in last 6 months? No  LIVING ENVIRONMENT: 1 story home + basement w/ spouse who works  Pt usually does most of cooking, otherwise split housework Has two dogs, beagle and mini aussie   OCCUPATION: Attorney full time  PLOF: Independent - enjoys MMA, going to gym, walk dogs  PATIENT GOALS: get back to PLOF, full mobility, get back in gym   NEXT MD VISIT: 05/29/23  OBJECTIVE:  Note: Objective measures were completed at Evaluation unless otherwise noted.  DIAGNOSTIC FINDINGS:  DOS 05/07/23 labral repair L shoulder  PATIENT SURVEYS:  Quick Dash 75%  COGNITION: Overall cognitive status: Within functional limits for tasks assessed     SENSATION: Denies sensory complaints today  POSTURE: Sling donned ; guarded LUE posture as expected post op  UPPER EXTREMITY ROM:  Passive ROM  Right eval Left eval Left AROM 06/06/23  Shoulder flexion  P: <25 deg limited by muscle guarding 130 improves to 150 154 AAROM  Shoulder abduction  P: ~30 deg limited by muscle guarding     Shoulder internal rotation     Shoulder external rotation (at side unless otherwise noted)     Elbow flexion     Elbow extension     Wrist flexion     Wrist extension      (Blank rows = not tested) (Key: WFL = within functional limits not formally assessed, * = concordant pain, s = stiffness/stretching sensation, NT = not tested)  Comments:   UPPER EXTREMITY MMT:  MMT Right eval  Left eval  Shoulder flexion    Shoulder extension    Shoulder abduction    Shoulder adduction    Shoulder internal rotation    Shoulder external rotation    Middle trapezius    Lower trapezius    Elbow flexion    Elbow extension    Wrist flexion    Wrist extension    Wrist ulnar deviation    Wrist radial deviation    Wrist pronation    Wrist supination    Grip strength (lbs)    (Blank rows = not tested) (Key: WFL = within functional limits not formally assessed, * = concordant pain, s = stiffness/stretching sensation, NT = not tested)  Comments: deferred on eval given post op protocol and acuity of surgery   PALPATION/OBSERVATION:  3 portal incisions (anterior, lateral, and posterior) appear grossly WNL, mild bleeding apparent anterior incision but not excessive. No significant erythema or swelling apparent, no excessive warmth.  Able to doff sling w/o assist, inc time/effort and guarding noted of LUE Min assist to don sling Able to perform upper body dressing for wound inspection w/o assist, inc time/effort noted and guarding LUE                                                                                                                              TREATMENT DATE:  07/18/23 Manual:Grade II-III inferior glide in progressive flexion/abduction; TPR subscap Supine shoulder flexion 3# 2 x 10 Quadruped shoulder flexion stretch 5 x 10 second holds Standing shoulder flexion 3#  2 x 10 Standing shoulder abduction 3# 2 x 10 Standing shoulder press 5# 3 x 10 High plank on knees with shoulder taps 2 x 10 Standing ER at 90 abduction RTB 2 x 10   07/16/23 Manual:Grade II-III inferior glide in progressive flexion/abduction Supine shoulder flexion 3# 2 x 10 Cat cow 2 x 10 Quadruped rotation 1 x 10 Quadruped shoulder flexion 2 x 10 Standing shoulder PNF GTB 3 x 10 Standing shoulder flexion 3# 3 x 10 Quadruped shoulder flexion stretch 10 x 5 second holds Bicep curls 7# 3 x  10 Standing ABC at wall with ball x 2  07/11/23 Manual:STM to Grade II-III inferior glide in progressive flexion/abduction  Standing horizontal abduction BTB 2 x 10  Supine shoulder PNF BTB 2 x 10 Cat cow 1 x 10 Quadruped rotation 1 x 10 Prone horizontal abduction 2# 2 x 10 Prone shoulder flexion 2# 2 x 10  Wall clock RTB 1 x 10 Bicep curls 5# 2 x 15  07/09/23 TTP L deltoid Manual: STM to L deltoid pre and post dry needling for trigger point identification and muscular relaxation. Trigger Point Dry Needling  Subsequent Treatment: Instructions provided previously at initial dry needling treatment.  Instructions reviewed, if requested by the patient, prior to subsequent dry needling treatment.   Patient Verbal Consent Given: Yes Education Handout Provided: Previously Provided Muscles Treated: L deltoid Electrical Stimulation Performed: No Treatment Response/Outcome: twitch response, decrease in tissue tension  Supine shoulder PNF DN RTB 2 x 10  Standing shoulder flexion 3# 2 x 10    06/27/23 TTP L deltoid Manual: STM to L deltoid pre and post dry needling for trigger point identification and muscular relaxation. Trigger Point Dry Needling  Initial Treatment: Pt instructed on Dry Needling rational, procedures, and possible side effects. Pt instructed to expect mild to moderate muscle soreness later in the day and/or into the next day.  Pt instructed in methods to reduce muscle soreness. Pt instructed to continue prescribed HEP. Patient was educated on signs and symptoms of infection and other risk factors and advised to seek medical attention should they occur.  Patient verbalized understanding of these instructions and education.   Patient Verbal Consent Given: Yes Education Handout Provided: Yes Muscles Treated: L deltoid Electrical Stimulation Performed: No Treatment Response/Outcome: twitch response, decrease in tissue tension, improvement in AROM  Standing shoulder  flexion with loop band RTB 3 x 10 Wall clock RTB 2 x 5 Band ER/IR RTB 2 x 15 Standing ABC at wall with ball 2x     PATIENT EDUCATION: Education details: Pt education on PT impairments, prognosis, and POC. Informed consent. Rationale for interventions, safe/appropriate HEP performance, post op precautions and self care as above 07/18/23: HEP Person educated: Patient Education method: Explanation, Demonstration, Tactile cues, Verbal cues Education comprehension: verbalized understanding, returned demonstration, verbal cues required, tactile cues required, and needs further education    HOME EXERCISE PROGRAM: Access Code: [email or web address] URL: [email or web address] Date: 05/09/2023 Prepared by: Mayme Spearman  Exercises - Seated Elbow Flexion AAROM  - 2-3 x daily - 7 x weekly - 1 sets - 10 reps - Seated AAROM Elbow Supination/Pronation with Clasped Hands  - 2-3 x daily - 7 x weekly - 1 sets - 10 reps - Seated Gripping Towel  - 2-3 x daily - 7 x weekly - 1 sets - 10 reps  ASSESSMENT:  CLINICAL IMPRESSION: Manual for mobility and shoulder mechanics. Hyperactive subscap limiting end range motion, improves moderately with Manual. Continued with shoulder  strengthening. Patient will continue to benefit from physical therapy in order to improve function and reduce impairment.     OBJECTIVE IMPAIRMENTS: decreased activity tolerance, decreased mobility, decreased ROM, decreased strength, impaired perceived functional ability, impaired UE functional use, postural dysfunction, and pain.   ACTIVITY LIMITATIONS: carrying, lifting, sleeping, bathing, dressing, reach over head, and hygiene/grooming  PARTICIPATION LIMITATIONS: meal prep, cleaning, laundry, driving, community activity, and occupation  PERSONAL FACTORS: Age and Time since onset of injury/illness/exacerbation are also affecting patient's functional outcome.   REHAB POTENTIAL: Good  CLINICAL DECISION MAKING:  Stable/uncomplicated  EVALUATION COMPLEXITY: Low   GOALS:   SHORT TERM GOALS: Target date: 06/20/2023   Pt will demonstrate appropriate understanding and performance of initially prescribed HEP in order to facilitate improved independence with management of symptoms.  Baseline: HEP established  Goal status: MET 5/15   2. Pt will report at least 25% improvement in overall pain levels over past week in order to facilitate improved tolerance to typical daily activities.   Baseline: 1-7/10  Goal status: INITIAL    LONG TERM GOALS: Target date: 08/01/2023 Pt will score less than or equal to 40% on Quick DASH in order to indicate reduced levels of disability due to shoulder pain (MDC 16-20pts).  Baseline: 75%   Goal status: INITIAL  2.  Pt will demonstrate at least 150 degrees of active shoulder elevation on surgical limb in order to demonstrate improved tolerance to functional movement patterns such as reaching overhead.  Baseline: see ROM chart above Goal status: INITIAL  3.  Pt will demonstrate at least 4+/5 shoulder flex/abd MMT for improved symmetry of UE strength and improved tolerance to functional movements.  Baseline: deferred on eval given surgical protocol Goal status: INITIAL  4. Pt will report ability to perform upper body dressing w/ less than 2 pt increase in pain in order to facilitate improved tolerance to ADLs.  Baseline: 1-7/10 pain with usual activities, inc time/difficulty performing in clinic  Goal status: INITIAL   5. Pt will report at least 50% decrease in overall pain levels in past week in order to facilitate improved tolerance to basic ADLs/mobility.   Baseline: 1-7/10  Goal status: INITIAL   6. Pt will endorse ability to perform usual household tasks with no more than 2 pt increase in pain in order to return to PLOF.   Baseline: housework limited as expected post op Goal status: INITIAL   PLAN:  PT FREQUENCY: 1-2x/week  PT DURATION: 12  weeks  PLANNED INTERVENTIONS: 97164- PT Re-evaluation, 97110-Therapeutic exercises, 97530- Therapeutic activity, 97112- Neuromuscular re-education, 97535- Self Care, 84132- Manual therapy, G0283- Electrical stimulation (unattended), Patient/Family education, Taping, Dry Needling, Joint mobilization, Spinal mobilization, Scar mobilization, Cryotherapy, and Moist heat  PLAN FOR NEXT SESSION: continue to progress as able/appropriate per Bokshan shoulder labral repair protocol   Beather Liming, PT, DPT 07/18/2023, 7:19 AM

## 2023-07-23 ENCOUNTER — Encounter (HOSPITAL_BASED_OUTPATIENT_CLINIC_OR_DEPARTMENT_OTHER): Payer: Self-pay | Admitting: Physical Therapy

## 2023-07-23 ENCOUNTER — Ambulatory Visit (HOSPITAL_BASED_OUTPATIENT_CLINIC_OR_DEPARTMENT_OTHER): Admitting: Physical Therapy

## 2023-07-23 DIAGNOSIS — M25612 Stiffness of left shoulder, not elsewhere classified: Secondary | ICD-10-CM

## 2023-07-23 DIAGNOSIS — M25512 Pain in left shoulder: Secondary | ICD-10-CM

## 2023-07-23 DIAGNOSIS — M6281 Muscle weakness (generalized): Secondary | ICD-10-CM

## 2023-07-23 NOTE — Therapy (Signed)
 OUTPATIENT PHYSICAL THERAPY SHOULDER TREATMENT   Patient Name: Seth Shields MRN: [id number] DOB:[birth year], 33 y.o., male Today's Date: 07/23/2023  END OF SESSION:  PT End of Session - 07/23/23 0725     Visit Number 16    Number of Visits 25    Date for PT Re-Evaluation 08/01/23    Authorization Type BCBS    PT Start Time 0725   arrives late   PT Stop Time 0757    PT Time Calculation (min) 32 min    Activity Tolerance Patient tolerated treatment well    Behavior During Therapy WFL for tasks assessed/performed             Past Medical History:  Diagnosis Date   BPH (benign prostatic hyperplasia)    Complication of anesthesia    woke up aggressive after hernia surgery   Inguinal hernia    Labral tear of shoulder    Past Surgical History:  Procedure Laterality Date   HERNIA REPAIR     SHOULDER ARTHROSCOPY WITH LABRAL REPAIR Left 05/07/2023   Procedure: ARTHROSCOPY, SHOULDER, WITH GLENOID LABRUM REPAIR;  Surgeon: Wilhelmenia Harada, MD;  Location: [facility];  Service: Orthopedics;  Laterality: Left;   Patient Active Problem List   Diagnosis Date Noted   Instability of left shoulder joint 05/07/2023   Chronic left shoulder pain 03/26/2023   Solitary pulmonary nodule 10/02/2018   Dyspnea 10/02/2018   Scoliosis deformity of spine 09/21/2017    PCP: Alexander Iba, PA  REFERRING PROVIDER: Wilhelmenia Harada, MD  REFERRING DIAG: [phone] (ICD-10-CM) - Instability of left shoulder joint  THERAPY DIAG:  Left shoulder pain, unspecified chronicity  Stiffness of left shoulder, not elsewhere classified  Muscle weakness (generalized)  Rationale for Evaluation and Treatment: Rehabilitation  ONSET DATE: L shoulder labral repair 05/07/23  SUBJECTIVE:                                                                                                                                                                                      SUBJECTIVE STATEMENT: Running  late since he had to jump start car. Pt states shoulder is feeling good, not too sore after last time. Some tightness in front part of shoulder with lifting overhead. Has to focus on mechanics.    Hand dominance: Right  PERTINENT HISTORY: BPH, L labral repair DOS 05/07/23  PAIN:  Are you having pain: 0/10 stiff Location/description: L anterolateral shoulder  Best-worst over past week: 1-7/10  - aggravating factors: general movement/activity  - Easing factors: medication, icing    PRECAUTIONS: L shoulder Bokshan labral repair protocol  RED FLAGS: None   WEIGHT BEARING RESTRICTIONS: Yes NWB  FALLS:  Has patient fallen in last 6 months? No  LIVING ENVIRONMENT: 1 story home + basement w/ spouse who works  Pt usually does most of cooking, otherwise split housework Has two dogs, beagle and mini aussie   OCCUPATION: Attorney full time  PLOF: Independent - enjoys MMA, going to gym, walk dogs  PATIENT GOALS: get back to PLOF, full mobility, get back in gym   NEXT MD VISIT: 05/29/23  OBJECTIVE:  Note: Objective measures were completed at Evaluation unless otherwise noted.  DIAGNOSTIC FINDINGS:  DOS 05/07/23 labral repair L shoulder  PATIENT SURVEYS:  Quick Dash 75%  COGNITION: Overall cognitive status: Within functional limits for tasks assessed     SENSATION: Denies sensory complaints today  POSTURE: Sling donned ; guarded LUE posture as expected post op  UPPER EXTREMITY ROM:  Passive ROM  Right eval Left eval Left AROM 06/06/23  Shoulder flexion  P: <25 deg limited by muscle guarding 130 improves to 150 154 AAROM  Shoulder abduction  P: ~30 deg limited by muscle guarding     Shoulder internal rotation     Shoulder external rotation (at side unless otherwise noted)     Elbow flexion     Elbow extension     Wrist flexion     Wrist extension      (Blank rows = not tested) (Key: WFL = within functional limits not formally assessed, * = concordant pain, s =  stiffness/stretching sensation, NT = not tested)  Comments:   UPPER EXTREMITY MMT:  MMT Right eval Left eval  Shoulder flexion    Shoulder extension    Shoulder abduction    Shoulder adduction    Shoulder internal rotation    Shoulder external rotation    Middle trapezius    Lower trapezius    Elbow flexion    Elbow extension    Wrist flexion    Wrist extension    Wrist ulnar deviation    Wrist radial deviation    Wrist pronation    Wrist supination    Grip strength (lbs)    (Blank rows = not tested) (Key: WFL = within functional limits not formally assessed, * = concordant pain, s = stiffness/stretching sensation, NT = not tested)  Comments: deferred on eval given post op protocol and acuity of surgery   PALPATION/OBSERVATION:  3 portal incisions (anterior, lateral, and posterior) appear grossly WNL, mild bleeding apparent anterior incision but not excessive. No significant erythema or swelling apparent, no excessive warmth.  Able to doff sling w/o assist, inc time/effort and guarding noted of LUE Min assist to don sling Able to perform upper body dressing for wound inspection w/o assist, inc time/effort noted and guarding LUE                                                                                                                              TREATMENT DATE:  07/23/23 Quadruped shoulder flexion stretch 5 x 10 second  holds Standing shoulder flexion with perpendicular resistance BTB 2 x 10 Standing shoulder abduction with perpendicular resistance BTB 2 x 10 Standing shoulder flexion 5# 2 x 10 Standing shoulder abduction 5# 2 x 10 Prone row into ER 1x 5 - discontinued due to impaired mechanics Manual: grade II-III AP glide in progressive ER at 90 abduction Standing ER at 90 abduction RTB 2 x 10 Standing shoulder press 5# KB bell up 3 x 10 Standing shoulder flexion overhead RTB 3 x 10 Bicep curls 10# 3 x 10  07/18/23 Manual:Grade II-III inferior glide in  progressive flexion/abduction; TPR subscap Supine shoulder flexion 3# 2 x 10 Quadruped shoulder flexion stretch 5 x 10 second holds Standing shoulder flexion 3# 2 x 10 Standing shoulder abduction 3# 2 x 10 Standing shoulder press 5# 3 x 10 High plank on knees with shoulder taps 2 x 10 Standing ER at 90 abduction RTB 2 x 10   07/16/23 Manual:Grade II-III inferior glide in progressive flexion/abduction Supine shoulder flexion 3# 2 x 10 Cat cow 2 x 10 Quadruped rotation 1 x 10 Quadruped shoulder flexion 2 x 10 Standing shoulder PNF GTB 3 x 10 Standing shoulder flexion 3# 3 x 10 Quadruped shoulder flexion stretch 10 x 5 second holds Bicep curls 7# 3 x 10 Standing ABC at wall with ball x 2  07/11/23 Manual:STM to Grade II-III inferior glide in progressive flexion/abduction  Standing horizontal abduction BTB 2 x 10  Supine shoulder PNF BTB 2 x 10 Cat cow 1 x 10 Quadruped rotation 1 x 10 Prone horizontal abduction 2# 2 x 10 Prone shoulder flexion 2# 2 x 10  Wall clock RTB 1 x 10 Bicep curls 5# 2 x 15  07/09/23 TTP L deltoid Manual: STM to L deltoid pre and post dry needling for trigger point identification and muscular relaxation. Trigger Point Dry Needling  Subsequent Treatment: Instructions provided previously at initial dry needling treatment.  Instructions reviewed, if requested by the patient, prior to subsequent dry needling treatment.   Patient Verbal Consent Given: Yes Education Handout Provided: Previously Provided Muscles Treated: L deltoid Electrical Stimulation Performed: No Treatment Response/Outcome: twitch response, decrease in tissue tension  Supine shoulder PNF DN RTB 2 x 10  Standing shoulder flexion 3# 2 x 10      PATIENT EDUCATION: Education details: Pt education on PT impairments, prognosis, and POC. Informed consent. Rationale for interventions, safe/appropriate HEP performance, post op precautions and self care as above 07/18/23: HEP Person educated:  Patient Education method: Explanation, Demonstration, Tactile cues, Verbal cues Education comprehension: verbalized understanding, returned demonstration, verbal cues required, tactile cues required, and needs further education    HOME EXERCISE PROGRAM: Access Code: [email or web address] URL: [email or web address] Date: 05/09/2023 Prepared by: Mayme Spearman  Exercises - Seated Elbow Flexion AAROM  - 2-3 x daily - 7 x weekly - 1 sets - 10 reps - Seated AAROM Elbow Supination/Pronation with Clasped Hands  - 2-3 x daily - 7 x weekly - 1 sets - 10 reps - Seated Gripping Towel  - 2-3 x daily - 7 x weekly - 1 sets - 10 reps  ASSESSMENT:  CLINICAL IMPRESSION: Session limited to patient's late arrival. Progressive shoulder and RC strengthening tolerated well with increased resistance as able. Patient with impaired mechanics with row into ER with no improvement in mechanics following manual, discontinued for today. Improved mechanics with band in standing. Moderate to high fatigue at EOS. Patient will continue to benefit from physical therapy in order to improve function and  reduce impairment.     OBJECTIVE IMPAIRMENTS: decreased activity tolerance, decreased mobility, decreased ROM, decreased strength, impaired perceived functional ability, impaired UE functional use, postural dysfunction, and pain.   ACTIVITY LIMITATIONS: carrying, lifting, sleeping, bathing, dressing, reach over head, and hygiene/grooming  PARTICIPATION LIMITATIONS: meal prep, cleaning, laundry, driving, community activity, and occupation  PERSONAL FACTORS: Age and Time since onset of injury/illness/exacerbation are also affecting patient's functional outcome.   REHAB POTENTIAL: Good  CLINICAL DECISION MAKING: Stable/uncomplicated  EVALUATION COMPLEXITY: Low   GOALS:   SHORT TERM GOALS: Target date: 06/20/2023   Pt will demonstrate appropriate understanding and performance of initially prescribed HEP in order to  facilitate improved independence with management of symptoms.  Baseline: HEP established  Goal status: MET 5/15   2. Pt will report at least 25% improvement in overall pain levels over past week in order to facilitate improved tolerance to typical daily activities.   Baseline: 1-7/10  Goal status: INITIAL    LONG TERM GOALS: Target date: 08/01/2023 Pt will score less than or equal to 40% on Quick DASH in order to indicate reduced levels of disability due to shoulder pain (MDC 16-20pts).  Baseline: 75%   Goal status: INITIAL  2.  Pt will demonstrate at least 150 degrees of active shoulder elevation on surgical limb in order to demonstrate improved tolerance to functional movement patterns such as reaching overhead.  Baseline: see ROM chart above Goal status: INITIAL  3.  Pt will demonstrate at least 4+/5 shoulder flex/abd MMT for improved symmetry of UE strength and improved tolerance to functional movements.  Baseline: deferred on eval given surgical protocol Goal status: INITIAL  4. Pt will report ability to perform upper body dressing w/ less than 2 pt increase in pain in order to facilitate improved tolerance to ADLs.  Baseline: 1-7/10 pain with usual activities, inc time/difficulty performing in clinic  Goal status: INITIAL   5. Pt will report at least 50% decrease in overall pain levels in past week in order to facilitate improved tolerance to basic ADLs/mobility.   Baseline: 1-7/10  Goal status: INITIAL   6. Pt will endorse ability to perform usual household tasks with no more than 2 pt increase in pain in order to return to PLOF.   Baseline: housework limited as expected post op Goal status: INITIAL   PLAN:  PT FREQUENCY: 1-2x/week  PT DURATION: 12 weeks  PLANNED INTERVENTIONS: 97164- PT Re-evaluation, 97110-Therapeutic exercises, 97530- Therapeutic activity, 97112- Neuromuscular re-education, 97535- Self Care, 45409- Manual therapy, G0283- Electrical stimulation  (unattended), Patient/Family education, Taping, Dry Needling, Joint mobilization, Spinal mobilization, Scar mobilization, Cryotherapy, and Moist heat  PLAN FOR NEXT SESSION: continue to progress as able/appropriate per Bokshan shoulder labral repair protocol   Beather Liming, PT, DPT 07/23/2023, 7:57 AM

## 2023-07-25 ENCOUNTER — Encounter (HOSPITAL_BASED_OUTPATIENT_CLINIC_OR_DEPARTMENT_OTHER): Payer: Self-pay | Admitting: Physical Therapy

## 2023-07-25 ENCOUNTER — Ambulatory Visit (HOSPITAL_BASED_OUTPATIENT_CLINIC_OR_DEPARTMENT_OTHER): Admitting: Physical Therapy

## 2023-07-25 DIAGNOSIS — M6281 Muscle weakness (generalized): Secondary | ICD-10-CM

## 2023-07-25 DIAGNOSIS — M25512 Pain in left shoulder: Secondary | ICD-10-CM | POA: Diagnosis not present

## 2023-07-25 DIAGNOSIS — M25612 Stiffness of left shoulder, not elsewhere classified: Secondary | ICD-10-CM

## 2023-07-25 NOTE — Therapy (Signed)
 OUTPATIENT PHYSICAL THERAPY SHOULDER TREATMENT   Patient Name: Seth Shields MRN: [id number] DOB:Apr 28, 1990, 33 y.o., male Today's Date: 07/25/2023  END OF SESSION:  PT End of Session - 07/25/23 0717     Visit Number 17    Number of Visits 25    Date for PT Re-Evaluation 08/01/23    Authorization Type BCBS    PT Start Time 0716    PT Stop Time 0756    PT Time Calculation (min) 40 min    Activity Tolerance Patient tolerated treatment well    Behavior During Therapy WFL for tasks assessed/performed             Past Medical History:  Diagnosis Date   BPH (benign prostatic hyperplasia)    Complication of anesthesia    woke up aggressive after hernia surgery   Inguinal hernia    Labral tear of shoulder    Past Surgical History:  Procedure Laterality Date   HERNIA REPAIR     SHOULDER ARTHROSCOPY WITH LABRAL REPAIR Left 05/07/2023   Procedure: ARTHROSCOPY, SHOULDER, WITH GLENOID LABRUM REPAIR;  Surgeon: Wilhelmenia Harada, MD;  Location: [facility];  Service: Orthopedics;  Laterality: Left;   Patient Active Problem List   Diagnosis Date Noted   Instability of left shoulder joint 05/07/2023   Chronic left shoulder pain 03/26/2023   Solitary pulmonary nodule 10/02/2018   Dyspnea 10/02/2018   Scoliosis deformity of spine 09/21/2017    PCP: Alexander Iba, PA  REFERRING PROVIDER: Wilhelmenia Harada, MD  REFERRING DIAG: [phone] (ICD-10-CM) - Instability of left shoulder joint  THERAPY DIAG:  Left shoulder pain, unspecified chronicity  Stiffness of left shoulder, not elsewhere classified  Muscle weakness (generalized)  Rationale for Evaluation and Treatment: Rehabilitation  ONSET DATE: L shoulder labral repair 05/07/23  SUBJECTIVE:                                                                                                                                                                                      SUBJECTIVE STATEMENT: Sore spot R upper c/sp  and levator region. L shoulder felt good after last time.    Hand dominance: Right  PERTINENT HISTORY: BPH, L labral repair DOS 05/07/23  PAIN:  Are you having pain: 0/10 stiff Location/description: L anterolateral shoulder  Best-worst over past week: 1-7/10  - aggravating factors: general movement/activity  - Easing factors: medication, icing    PRECAUTIONS: L shoulder [facility] labral repair protocol  RED FLAGS: None   WEIGHT BEARING RESTRICTIONS: Yes NWB  FALLS:  Has patient fallen in last 6 months? No  LIVING ENVIRONMENT: 1 story home + basement w/ spouse who works  Pt usually  does most of cooking, otherwise split housework Has two dogs, beagle and mini aussie   OCCUPATION: Attorney full time  PLOF: Independent - enjoys MMA, going to gym, walk dogs  PATIENT GOALS: get back to PLOF, full mobility, get back in gym   NEXT MD VISIT: 05/29/23  OBJECTIVE:  Note: Objective measures were completed at Evaluation unless otherwise noted.  DIAGNOSTIC FINDINGS:  DOS 05/07/23 labral repair L shoulder  PATIENT SURVEYS:  Quick Dash 75%  COGNITION: Overall cognitive status: Within functional limits for tasks assessed     SENSATION: Denies sensory complaints today  POSTURE: Sling donned ; guarded LUE posture as expected post op  UPPER EXTREMITY ROM:  Passive ROM  Right eval Left eval Left AROM 06/06/23  Shoulder flexion  P: <25 deg limited by muscle guarding 130 improves to 150 154 AAROM  Shoulder abduction  P: ~30 deg limited by muscle guarding     Shoulder internal rotation     Shoulder external rotation (at side unless otherwise noted)     Elbow flexion     Elbow extension     Wrist flexion     Wrist extension      (Blank rows = not tested) (Key: WFL = within functional limits not formally assessed, * = concordant pain, s = stiffness/stretching sensation, NT = not tested)  Comments:   UPPER EXTREMITY MMT:  MMT Right eval Left eval  Shoulder flexion     Shoulder extension    Shoulder abduction    Shoulder adduction    Shoulder internal rotation    Shoulder external rotation    Middle trapezius    Lower trapezius    Elbow flexion    Elbow extension    Wrist flexion    Wrist extension    Wrist ulnar deviation    Wrist radial deviation    Wrist pronation    Wrist supination    Grip strength (lbs)    (Blank rows = not tested) (Key: WFL = within functional limits not formally assessed, * = concordant pain, s = stiffness/stretching sensation, NT = not tested)  Comments: deferred on eval given post op protocol and acuity of surgery   PALPATION/OBSERVATION:  3 portal incisions (anterior, lateral, and posterior) appear grossly WNL, mild bleeding apparent anterior incision but not excessive. No significant erythema or swelling apparent, no excessive warmth.  Able to doff sling w/o assist, inc time/effort and guarding noted of LUE Min assist to don sling Able to perform upper body dressing for wound inspection w/o assist, inc time/effort noted and guarding LUE                                                                                                                              TREATMENT DATE:  07/25/23 Quadruped shoulder flexion 2 x 10 Standing shoulder flexion with perpendicular resistance BTB 2 x 10 Standing shoulder abduction with perpendicular resistance BTB 2 x 10 Standing ER at 90 abduction  RTB 3 x 10 Standing shoulder flexion 5# 3 x 10 Standing shoulder abduction 5# 3 x 10 Standing shoulder press 10# KB bell up 3 x 10 Tricep dip 2 x 10   07/23/23 Quadruped shoulder flexion stretch 5 x 10 second holds Standing shoulder flexion with perpendicular resistance BTB 2 x 10 Standing shoulder abduction with perpendicular resistance BTB 2 x 10 Standing shoulder flexion 5# 2 x 10 Standing shoulder abduction 5# 2 x 10 Prone row into ER 1x 5 - discontinued due to impaired mechanics Manual: grade II-III AP glide in progressive ER  at 90 abduction Standing ER at 90 abduction RTB 2 x 10 Standing shoulder press 5# KB bell up 3 x 10 Standing shoulder flexion overhead RTB 3 x 10 Bicep curls 10# 3 x 10  07/18/23 Manual:Grade II-III inferior glide in progressive flexion/abduction; TPR subscap Supine shoulder flexion 3# 2 x 10 Quadruped shoulder flexion stretch 5 x 10 second holds Standing shoulder flexion 3# 2 x 10 Standing shoulder abduction 3# 2 x 10 Standing shoulder press 5# 3 x 10 High plank on knees with shoulder taps 2 x 10 Standing ER at 90 abduction RTB 2 x 10   07/16/23 Manual:Grade II-III inferior glide in progressive flexion/abduction Supine shoulder flexion 3# 2 x 10 Cat cow 2 x 10 Quadruped rotation 1 x 10 Quadruped shoulder flexion 2 x 10 Standing shoulder PNF GTB 3 x 10 Standing shoulder flexion 3# 3 x 10 Quadruped shoulder flexion stretch 10 x 5 second holds Bicep curls 7# 3 x 10 Standing ABC at wall with ball x 2  07/11/23 Manual:STM to Grade II-III inferior glide in progressive flexion/abduction  Standing horizontal abduction BTB 2 x 10  Supine shoulder PNF BTB 2 x 10 Cat cow 1 x 10 Quadruped rotation 1 x 10 Prone horizontal abduction 2# 2 x 10 Prone shoulder flexion 2# 2 x 10  Wall clock RTB 1 x 10 Bicep curls 5# 2 x 15  07/09/23 TTP L deltoid Manual: STM to L deltoid pre and post dry needling for trigger point identification and muscular relaxation. Trigger Point Dry Needling  Subsequent Treatment: Instructions provided previously at initial dry needling treatment.  Instructions reviewed, if requested by the patient, prior to subsequent dry needling treatment.   Patient Verbal Consent Given: Yes Education Handout Provided: Previously Provided Muscles Treated: L deltoid Electrical Stimulation Performed: No Treatment Response/Outcome: twitch response, decrease in tissue tension  Supine shoulder PNF DN RTB 2 x 10  Standing shoulder flexion 3# 2 x 10      PATIENT  EDUCATION: Education details: Pt education on PT impairments, prognosis, and POC. Informed consent. Rationale for interventions, safe/appropriate HEP performance, post op precautions and self care as above 07/18/23: HEP Person educated: Patient Education method: Explanation, Demonstration, Tactile cues, Verbal cues Education comprehension: verbalized understanding, returned demonstration, verbal cues required, tactile cues required, and needs further education    HOME EXERCISE PROGRAM: Access Code: [email or web address] URL: [email or web address] Date: 05/09/2023 Prepared by: Mayme Spearman  Exercises - Seated Elbow Flexion AAROM  - 2-3 x daily - 7 x weekly - 1 sets - 10 reps - Seated AAROM Elbow Supination/Pronation with Clasped Hands  - 2-3 x daily - 7 x weekly - 1 sets - 10 reps - Seated Gripping Towel  - 2-3 x daily - 7 x weekly - 1 sets - 10 reps  ASSESSMENT:  CLINICAL IMPRESSION: Patient demonstrating improving AROM with increased ease. Continued with progressive shoulder strengthening which is tolerated well. Intermittent cueing  for mechanics and positioning. Patient will continue to benefit from physical therapy in order to improve function and reduce impairment.     OBJECTIVE IMPAIRMENTS: decreased activity tolerance, decreased mobility, decreased ROM, decreased strength, impaired perceived functional ability, impaired UE functional use, postural dysfunction, and pain.   ACTIVITY LIMITATIONS: carrying, lifting, sleeping, bathing, dressing, reach over head, and hygiene/grooming  PARTICIPATION LIMITATIONS: meal prep, cleaning, laundry, driving, community activity, and occupation  PERSONAL FACTORS: Age and Time since onset of injury/illness/exacerbation are also affecting patient's functional outcome.   REHAB POTENTIAL: Good  CLINICAL DECISION MAKING: Stable/uncomplicated  EVALUATION COMPLEXITY: Low   GOALS:   SHORT TERM GOALS: Target date: 06/20/2023   Pt will  demonstrate appropriate understanding and performance of initially prescribed HEP in order to facilitate improved independence with management of symptoms.  Baseline: HEP established  Goal status: MET 5/15   2. Pt will report at least 25% improvement in overall pain levels over past week in order to facilitate improved tolerance to typical daily activities.   Baseline: 1-7/10  Goal status: INITIAL    LONG TERM GOALS: Target date: 08/01/2023 Pt will score less than or equal to 40% on Quick DASH in order to indicate reduced levels of disability due to shoulder pain (MDC 16-20pts).  Baseline: 75%   Goal status: INITIAL  2.  Pt will demonstrate at least 150 degrees of active shoulder elevation on surgical limb in order to demonstrate improved tolerance to functional movement patterns such as reaching overhead.  Baseline: see ROM chart above Goal status: INITIAL  3.  Pt will demonstrate at least 4+/5 shoulder flex/abd MMT for improved symmetry of UE strength and improved tolerance to functional movements.  Baseline: deferred on eval given surgical protocol Goal status: INITIAL  4. Pt will report ability to perform upper body dressing w/ less than 2 pt increase in pain in order to facilitate improved tolerance to ADLs.  Baseline: 1-7/10 pain with usual activities, inc time/difficulty performing in clinic  Goal status: INITIAL   5. Pt will report at least 50% decrease in overall pain levels in past week in order to facilitate improved tolerance to basic ADLs/mobility.   Baseline: 1-7/10  Goal status: INITIAL   6. Pt will endorse ability to perform usual household tasks with no more than 2 pt increase in pain in order to return to PLOF.   Baseline: housework limited as expected post op Goal status: INITIAL   PLAN:  PT FREQUENCY: 1-2x/week  PT DURATION: 12 weeks  PLANNED INTERVENTIONS: 97164- PT Re-evaluation, 97110-Therapeutic exercises, 97530- Therapeutic activity, 97112- Neuromuscular  re-education, 97535- Self Care, 21308- Manual therapy, [id number]- Electrical stimulation (unattended), Patient/Family education, Taping, Dry Needling, Joint mobilization, Spinal mobilization, Scar mobilization, Cryotherapy, and Moist heat  PLAN FOR NEXT SESSION: continue to progress as able/appropriate per [facility] shoulder labral repair protocol   Beather Liming, PT, DPT 07/25/2023, 7:18 AM

## 2023-07-30 ENCOUNTER — Encounter (HOSPITAL_BASED_OUTPATIENT_CLINIC_OR_DEPARTMENT_OTHER): Payer: Self-pay | Admitting: Physical Therapy

## 2023-07-30 ENCOUNTER — Ambulatory Visit (HOSPITAL_BASED_OUTPATIENT_CLINIC_OR_DEPARTMENT_OTHER): Admitting: Physical Therapy

## 2023-07-30 DIAGNOSIS — M6281 Muscle weakness (generalized): Secondary | ICD-10-CM

## 2023-07-30 DIAGNOSIS — M25512 Pain in left shoulder: Secondary | ICD-10-CM

## 2023-07-30 DIAGNOSIS — M25612 Stiffness of left shoulder, not elsewhere classified: Secondary | ICD-10-CM

## 2023-07-30 NOTE — Therapy (Signed)
 OUTPATIENT PHYSICAL THERAPY SHOULDER TREATMENT   Patient Name: Seth Shields MRN: [id number] DOB:03-10-1990, 33 y.o., male Today's Date: 07/30/2023  END OF SESSION:  PT End of Session - 07/30/23 0721     Visit Number 18    Number of Visits 25    Date for PT Re-Evaluation 08/01/23    Authorization Type BC[employer]    PT Start Time 0720    PT Stop Time 0758    PT Time Calculation (min) 38 min    Activity Tolerance Patient tolerated treatment well    Behavior During Therapy WFL for tasks assessed/performed             Past Medical History:  Diagnosis Date   BPH (benign prostatic hyperplasia)    Complication of anesthesia    woke up aggressive after hernia surgery   Inguinal hernia    Labral tear of shoulder    Past Surgical History:  Procedure Laterality Date   HERNIA REPAIR     SHOULDER ARTHROSCOPY WITH LABRAL REPAIR Left 05/07/2023   Procedure: ARTHROSCOPY, SHOULDER, WITH GLENOID LABRUM REPAIR;  Surgeon: Genelle Standing, MD;  Location: [facility];  Service: Orthopedics;  Laterality: Left;   Patient Active Problem List   Diagnosis Date Noted   Instability of left shoulder joint 05/07/2023   Chronic left shoulder pain 03/26/2023   Solitary pulmonary nodule 10/02/2018   Dyspnea 10/02/2018   Scoliosis deformity of spine 09/21/2017    PCP: Job Lukes, PA  REFERRING PROVIDER: Genelle Standing, MD  REFERRING DIAG: [phone] (ICD-10-CM) - Instability of left shoulder joint  THERAPY DIAG:  Left shoulder pain, unspecified chronicity  Stiffness of left shoulder, not elsewhere classified  Muscle weakness (generalized)  Rationale for Evaluation and Treatment: Rehabilitation  ONSET DATE: L shoulder labral repair 05/07/23  SUBJECTIVE:                                                                                                                                                                                      SUBJECTIVE STATEMENT: Patient states the day  after last session. Going to MD for neck today. Shoulder has been doing well though.    Hand dominance: Right  PERTINENT HISTORY: BPH, L labral repair DOS 05/07/23  PAIN:  Are you having pain: 0/10 stiff Location/description: L anterolateral shoulder  Best-worst over past week: 1-7/10  - aggravating factors: general movement/activity  - Easing factors: medication, icing    PRECAUTIONS: L shoulder Bokshan labral repair protocol  RED FLAGS: None   WEIGHT BEARING RESTRICTIONS: Yes NWB  FALLS:  Has patient fallen in last 6 months? No  LIVING ENVIRONMENT: 1 story home + basement w/ spouse who  works  Pt usually does most of cooking, otherwise split housework Has two dogs, Orthoptist and mini aussie   OCCUPATION: Attorney full time  PLOF: Independent - enjoys MMA, going to gym, walk dogs  PATIENT GOALS: get back to PLOF, full mobility, get back in gym   NEXT MD VISIT: 05/29/23  OBJECTIVE:  Note: Objective measures were completed at Evaluation unless otherwise noted.  DIAGNOSTIC FINDINGS:  DOS 05/07/23 labral repair L shoulder  PATIENT SURVEYS:  Quick Dash 75%  COGNITION: Overall cognitive status: Within functional limits for tasks assessed     SENSATION: Denies sensory complaints today  POSTURE: Sling donned ; guarded LUE posture as expected post op  UPPER EXTREMITY ROM:  Passive ROM  Right eval Left eval Left AROM 06/06/23  Shoulder flexion  P: <25 deg limited by muscle guarding 130 improves to 150 154 AAROM  Shoulder abduction  P: ~30 deg limited by muscle guarding     Shoulder internal rotation     Shoulder external rotation (at side unless otherwise noted)     Elbow flexion     Elbow extension     Wrist flexion     Wrist extension      (Blank rows = not tested) (Key: WFL = within functional limits not formally assessed, * = concordant pain, s = stiffness/stretching sensation, NT = not tested)  Comments:   UPPER EXTREMITY MMT:  MMT Right eval  Left eval  Shoulder flexion    Shoulder extension    Shoulder abduction    Shoulder adduction    Shoulder internal rotation    Shoulder external rotation    Middle trapezius    Lower trapezius    Elbow flexion    Elbow extension    Wrist flexion    Wrist extension    Wrist ulnar deviation    Wrist radial deviation    Wrist pronation    Wrist supination    Grip strength (lbs)    (Blank rows = not tested) (Key: WFL = within functional limits not formally assessed, * = concordant pain, s = stiffness/stretching sensation, NT = not tested)  Comments: deferred on eval given post op protocol and acuity of surgery   PALPATION/OBSERVATION:  3 portal incisions (anterior, lateral, and posterior) appear grossly WNL, mild bleeding apparent anterior incision but not excessive. No significant erythema or swelling apparent, no excessive warmth.  Able to doff sling w/o assist, inc time/effort and guarding noted of LUE Min assist to don sling Able to perform upper body dressing for wound inspection w/o assist, inc time/effort noted and guarding LUE                                                                                                                              TREATMENT DATE:  07/30/23 UBE retro 2 minutes, fwd 2 minutes Standing shoulder flexion with perpendicular resistance BTB 2 x 10 Standing shoulder abduction with perpendicular resistance BTB 2 x 10  Body blade: flexion, abduction, horizontal abduction, ER/IR neutral 3 x 30 seconds each Standing ER at 90 abduction RTB 3 x 10 Standing IR at 90 abduction RTB 3 x 10 Standing shoulder press 10# KB bell up 3 x 10  07/25/23 Quadruped shoulder flexion 2 x 10 Standing shoulder flexion with perpendicular resistance BTB 2 x 10 Standing shoulder abduction with perpendicular resistance BTB 2 x 10 Standing ER at 90 abduction RTB 3 x 10 Standing shoulder flexion 5# 3 x 10 Standing shoulder abduction 5# 3 x 10 Standing shoulder press 10#  KB bell up 3 x 10 Tricep dip 2 x 10   07/23/23 Quadruped shoulder flexion stretch 5 x 10 second holds Standing shoulder flexion with perpendicular resistance BTB 2 x 10 Standing shoulder abduction with perpendicular resistance BTB 2 x 10 Standing shoulder flexion 5# 2 x 10 Standing shoulder abduction 5# 2 x 10 Prone row into ER 1x 5 - discontinued due to impaired mechanics Manual: grade II-III AP glide in progressive ER at 90 abduction Standing ER at 90 abduction RTB 2 x 10 Standing shoulder press 5# KB bell up 3 x 10 Standing shoulder flexion overhead RTB 3 x 10 Bicep curls 10# 3 x 10  07/18/23 Manual:Grade II-III inferior glide in progressive flexion/abduction; TPR subscap Supine shoulder flexion 3# 2 x 10 Quadruped shoulder flexion stretch 5 x 10 second holds Standing shoulder flexion 3# 2 x 10 Standing shoulder abduction 3# 2 x 10 Standing shoulder press 5# 3 x 10 High plank on knees with shoulder taps 2 x 10 Standing ER at 90 abduction RTB 2 x 10   07/16/23 Manual:Grade II-III inferior glide in progressive flexion/abduction Supine shoulder flexion 3# 2 x 10 Cat cow 2 x 10 Quadruped rotation 1 x 10 Quadruped shoulder flexion 2 x 10 Standing shoulder PNF GTB 3 x 10 Standing shoulder flexion 3# 3 x 10 Quadruped shoulder flexion stretch 10 x 5 second holds Bicep curls 7# 3 x 10 Standing ABC at wall with ball x 2  07/11/23 Manual:STM to Grade II-III inferior glide in progressive flexion/abduction  Standing horizontal abduction BTB 2 x 10  Supine shoulder PNF BTB 2 x 10 Cat cow 1 x 10 Quadruped rotation 1 x 10 Prone horizontal abduction 2# 2 x 10 Prone shoulder flexion 2# 2 x 10  Wall clock RTB 1 x 10 Bicep curls 5# 2 x 15  07/09/23 TTP L deltoid Manual: STM to L deltoid pre and post dry needling for trigger point identification and muscular relaxation. Trigger Point Dry Needling  Subsequent Treatment: Instructions provided previously at initial dry needling treatment.   Instructions reviewed, if requested by the patient, prior to subsequent dry needling treatment.   Patient Verbal Consent Given: Yes Education Handout Provided: Previously Provided Muscles Treated: L deltoid Electrical Stimulation Performed: No Treatment Response/Outcome: twitch response, decrease in tissue tension  Supine shoulder PNF DN RTB 2 x 10  Standing shoulder flexion 3# 2 x 10      PATIENT EDUCATION: Education details: Pt education on PT impairments, prognosis, and POC. Informed consent. Rationale for interventions, safe/appropriate HEP performance, post op precautions and self care as above 07/18/23: HEP Person educated: Patient Education method: Explanation, Demonstration, Tactile cues, Verbal cues Education comprehension: verbalized understanding, returned demonstration, verbal cues required, tactile cues required, and needs further education    HOME EXERCISE PROGRAM: Access Code: [email or web address] URL: [email or web address] Date: 05/09/2023 Prepared by: Alm Jenny  Exercises - Seated Elbow Flexion AAROM  - 2-3 x daily -  7 x weekly - 1 sets - 10 reps - Seated AAROM Elbow Supination/Pronation with Clasped Hands  - 2-3 x daily - 7 x weekly - 1 sets - 10 reps - Seated Gripping Towel  - 2-3 x daily - 7 x weekly - 1 sets - 10 reps  ASSESSMENT:  CLINICAL IMPRESSION: Patient now 12 weeks post op. Began UBE for dynamic warm up. Some difficulty with body blade mechanics, improves with cueing for posture/core activation. Intermittent multimodial cueing required for mechanics and he requires more with fatigue. Moderate to higher fatigue at EOS. Patient will continue to benefit from physical therapy in order to improve function and reduce impairment.     OBJECTIVE IMPAIRMENTS: decreased activity tolerance, decreased mobility, decreased ROM, decreased strength, impaired perceived functional ability, impaired UE functional use, postural dysfunction, and pain.    ACTIVITY LIMITATIONS: carrying, lifting, sleeping, bathing, dressing, reach over head, and hygiene/grooming  PARTICIPATION LIMITATIONS: meal prep, cleaning, laundry, driving, community activity, and occupation  PERSONAL FACTORS: Age and Time since onset of injury/illness/exacerbation are also affecting patient's functional outcome.   REHAB POTENTIAL: Good  CLINICAL DECISION MAKING: Stable/uncomplicated  EVALUATION COMPLEXITY: Low   GOALS:   SHORT TERM GOALS: Target date: 06/20/2023   Pt will demonstrate appropriate understanding and performance of initially prescribed HEP in order to facilitate improved independence with management of symptoms.  Baseline: HEP established  Goal status: MET 5/15   2. Pt will report at least 25% improvement in overall pain levels over past week in order to facilitate improved tolerance to typical daily activities.   Baseline: 1-7/10  Goal status: INITIAL    LONG TERM GOALS: Target date: 08/01/2023 Pt will score less than or equal to 40% on Quick DASH in order to indicate reduced levels of disability due to shoulder pain (MDC 16-20pts).  Baseline: 75%   Goal status: INITIAL  2.  Pt will demonstrate at least 150 degrees of active shoulder elevation on surgical limb in order to demonstrate improved tolerance to functional movement patterns such as reaching overhead.  Baseline: see ROM chart above Goal status: INITIAL  3.  Pt will demonstrate at least 4+/5 shoulder flex/abd MMT for improved symmetry of UE strength and improved tolerance to functional movements.  Baseline: deferred on eval given surgical protocol Goal status: INITIAL  4. Pt will report ability to perform upper body dressing w/ less than 2 pt increase in pain in order to facilitate improved tolerance to ADLs.  Baseline: 1-7/10 pain with usual activities, inc time/difficulty performing in clinic  Goal status: INITIAL   5. Pt will report at least 50% decrease in overall pain levels  in past week in order to facilitate improved tolerance to basic ADLs/mobility.   Baseline: 1-7/10  Goal status: INITIAL   6. Pt will endorse ability to perform usual household tasks with no more than 2 pt increase in pain in order to return to PLOF.   Baseline: housework limited as expected post op Goal status: INITIAL   PLAN:  PT FREQUENCY: 1-2x/week  PT DURATION: 12 weeks  PLANNED INTERVENTIONS: 97164- PT Re-evaluation, 97110-Therapeutic exercises, 97530- Therapeutic activity, 97112- Neuromuscular re-education, 97535- Self Care, [id number]- Manual therapy, [id number]- Electrical stimulation (unattended), Patient/Family education, Taping, Dry Needling, Joint mobilization, Spinal mobilization, Scar mobilization, Cryotherapy, and Moist heat  PLAN FOR NEXT SESSION: continue to progress as able/appropriate per B[facility]shan shoulder labral repair protocol   Prentice RAMAN Eura Radabaugh, PT, DPT 07/30/2023, 7:21 AM

## 2023-08-01 ENCOUNTER — Telehealth (HOSPITAL_BASED_OUTPATIENT_CLINIC_OR_DEPARTMENT_OTHER): Payer: Self-pay | Admitting: Physical Therapy

## 2023-08-01 ENCOUNTER — Ambulatory Visit (HOSPITAL_BASED_OUTPATIENT_CLINIC_OR_DEPARTMENT_OTHER): Admitting: Physical Therapy

## 2023-08-01 NOTE — Telephone Encounter (Signed)
 Patient no show, left message for patient to make him aware of missed appointment. Offered patient open appointments today and instructed him to call back to schedule if able to attend today.  7:39 AM, 08/01/23 Prentice CANDIE Stains PT, DPT Physical Therapist at [facility]

## 2023-08-06 ENCOUNTER — Encounter (HOSPITAL_BASED_OUTPATIENT_CLINIC_OR_DEPARTMENT_OTHER): Payer: Self-pay | Admitting: Physical Therapy

## 2023-08-06 ENCOUNTER — Ambulatory Visit (HOSPITAL_BASED_OUTPATIENT_CLINIC_OR_DEPARTMENT_OTHER): Attending: Orthopaedic Surgery | Admitting: Physical Therapy

## 2023-08-06 DIAGNOSIS — M25612 Stiffness of left shoulder, not elsewhere classified: Secondary | ICD-10-CM | POA: Diagnosis present

## 2023-08-06 DIAGNOSIS — M25512 Pain in left shoulder: Secondary | ICD-10-CM | POA: Diagnosis present

## 2023-08-06 DIAGNOSIS — M6281 Muscle weakness (generalized): Secondary | ICD-10-CM | POA: Diagnosis present

## 2023-08-06 NOTE — Therapy (Signed)
 OUTPATIENT PHYSICAL THERAPY SHOULDER TREATMENT   Patient Name: Seth Shields MRN: [id number] DOB:October 09, 1990, 33 y.o., male Today's Date: 08/06/2023  Progress Note   Reporting Period 05/09/23 to 08/06/23   See note below for Objective Data and Assessment of Progress/Goals   END OF SESSION:  PT End of Session - 08/06/23 0717     Visit Number 19    Number of Visits 31    Date for PT Re-Evaluation 09/17/23    Authorization Type BC[employer]    PT Start Time 0716    PT Stop Time 0756    PT Time Calculation (min) 40 min    Activity Tolerance Patient tolerated treatment well    Behavior During Therapy WFL for tasks assessed/performed             Past Medical History:  Diagnosis Date   BPH (benign prostatic hyperplasia)    Complication of anesthesia    woke up aggressive after hernia surgery   Inguinal hernia    Labral tear of shoulder    Past Surgical History:  Procedure Laterality Date   HERNIA REPAIR     SHOULDER ARTHROSCOPY WITH LABRAL REPAIR Left 05/07/2023   Procedure: ARTHROSCOPY, SHOULDER, WITH GLENOID LABRUM REPAIR;  Surgeon: Genelle Standing, MD;  Location: [facility];  Service: Orthopedics;  Laterality: Left;   Patient Active Problem List   Diagnosis Date Noted   Instability of left shoulder joint 05/07/2023   Chronic left shoulder pain 03/26/2023   Solitary pulmonary nodule 10/02/2018   Dyspnea 10/02/2018   Scoliosis deformity of spine 09/21/2017    PCP: Job Lukes, PA  REFERRING PROVIDER: Genelle Standing, MD  REFERRING DIAG: [phone] (ICD-10-CM) - Instability of left shoulder joint  THERAPY DIAG:  Left shoulder pain, unspecified chronicity  Stiffness of left shoulder, not elsewhere classified  Muscle weakness (generalized)  Rationale for Evaluation and Treatment: Rehabilitation  ONSET DATE: L shoulder labral repair 05/07/23  SUBJECTIVE:                                                                                                                                                                                       SUBJECTIVE STATEMENT: Patient states HEP going well. He states 75% improvement/functional status. Remains limited by strength. Feels that some more PT would be helpful for strengthening and flexibility.    Hand dominance: Right  PERTINENT HISTORY: BPH, L labral repair DOS 05/07/23  PAIN:  Are you having pain: 0/10 stiff Location/description: L anterolateral shoulder  Best-worst over past week: 1/10  - aggravating factors: rolling on shoulder when sleeping - Easing factors: medication, icing    PRECAUTIONS: L shoulder Bokshan labral repair protocol  RED FLAGS: None   WEIGHT BEARING RESTRICTIONS: Yes NWB  FALLS:  Has patient fallen in last 6 months? No  LIVING ENVIRONMENT: 1 story home + basement w/ spouse who works  Pt usually does most of cooking, otherwise split housework Has two dogs, beagle and mini aussie   OCCUPATION: Attorney full time  PLOF: Independent - enjoys MMA, going to gym, walk dogs  PATIENT GOALS: get back to PLOF, full mobility, get back in gym   NEXT MD VISIT: 05/29/23  OBJECTIVE:  Note: Objective measures were completed at Evaluation unless otherwise noted.  DIAGNOSTIC FINDINGS:  DOS 05/07/23 labral repair L shoulder  PATIENT SURVEYS:  Seth Shields 75% 08/06/23 13.6%  COGNITION: Overall cognitive status: Within functional limits for tasks assessed     SENSATION: Denies sensory complaints today  POSTURE: Sling donned ; guarded LUE posture as expected post op  UPPER EXTREMITY ROM:  Passive ROM  Right eval Left eval Left AROM 06/06/23 Left AROM 08/06/23  Shoulder flexion  P: <25 deg limited by muscle guarding 130 improves to 150 154 AAROM 160  Shoulder abduction  P: ~30 deg limited by muscle guarding    156  Shoulder internal rotation      Shoulder external rotation (at side unless otherwise noted)    55  Elbow flexion      Elbow extension      Wrist flexion       Wrist extension       (Blank rows = not tested) (Key: WFL = within functional limits not formally assessed, * = concordant pain, s = stiffness/stretching sensation, NT = not tested)  Comments:   UPPER EXTREMITY MMT:  MMT Right eval Left eval Right 08/06/23 Left 08/06/23  Shoulder flexion   5 5  Shoulder extension      Shoulder abduction   5 5  Shoulder adduction      Shoulder internal rotation   5 5  Shoulder external rotation   5 4+  Middle trapezius      Lower trapezius      Elbow flexion   5 4+  Elbow extension   5 5  Wrist flexion      Wrist extension      Wrist ulnar deviation      Wrist radial deviation      Wrist pronation      Wrist supination      Grip strength (lbs)      (Blank rows = not tested) (Key: WFL = within functional limits not formally assessed, * = concordant pain, s = stiffness/stretching sensation, NT = not tested)  Comments: deferred on eval given post op protocol and acuity of surgery   PALPATION/OBSERVATION:  3 portal incisions (anterior, lateral, and posterior) appear grossly WNL, mild bleeding apparent anterior incision but not excessive. No significant erythema or swelling apparent, no excessive warmth.  Able to doff sling w/o assist, inc time/effort and guarding noted of LUE Min assist to don sling Able to perform upper body dressing for wound inspection w/o assist, inc time/effort noted and guarding LUE  TREATMENT DATE:  08/06/23 Reassessment and discussion of POC UBE retro 2 minutes, fwd 2 minutes level 4 Standing ER at 90 abduction GTB 3 x 10 Standing IR at 90 abduction GTB 3 x 10 High Row Black TB 3 x 10 Body blade: flexion, abduction, horizontal abduction, ER/IR neutral 2 x 30 seconds each Standing shoulder press 15# KB bell up 3 x 10  07/30/23 UBE retro 2 minutes, fwd 2 minutes Standing shoulder flexion with  perpendicular resistance BTB 2 x 10 Standing shoulder abduction with perpendicular resistance BTB 2 x 10 Body blade: flexion, abduction, horizontal abduction, ER/IR neutral 3 x 30 seconds each Standing ER at 90 abduction RTB 3 x 10 Standing IR at 90 abduction RTB 3 x 10 Standing shoulder press 10# KB bell up 3 x 10  07/25/23 Quadruped shoulder flexion 2 x 10 Standing shoulder flexion with perpendicular resistance BTB 2 x 10 Standing shoulder abduction with perpendicular resistance BTB 2 x 10 Standing ER at 90 abduction RTB 3 x 10 Standing shoulder flexion 5# 3 x 10 Standing shoulder abduction 5# 3 x 10 Standing shoulder press 10# KB bell up 3 x 10 Tricep dip 2 x 10   07/23/23 Quadruped shoulder flexion stretch 5 x 10 second holds Standing shoulder flexion with perpendicular resistance BTB 2 x 10 Standing shoulder abduction with perpendicular resistance BTB 2 x 10 Standing shoulder flexion 5# 2 x 10 Standing shoulder abduction 5# 2 x 10 Prone row into ER 1x 5 - discontinued due to impaired mechanics Manual: grade II-III AP glide in progressive ER at 90 abduction Standing ER at 90 abduction RTB 2 x 10 Standing shoulder press 5# KB bell up 3 x 10 Standing shoulder flexion overhead RTB 3 x 10 Bicep curls 10# 3 x 10  07/18/23 Manual:Grade II-III inferior glide in progressive flexion/abduction; TPR subscap Supine shoulder flexion 3# 2 x 10 Quadruped shoulder flexion stretch 5 x 10 second holds Standing shoulder flexion 3# 2 x 10 Standing shoulder abduction 3# 2 x 10 Standing shoulder press 5# 3 x 10 High plank on knees with shoulder taps 2 x 10 Standing ER at 90 abduction RTB 2 x 10      PATIENT EDUCATION: Education details: Pt education on PT impairments, prognosis, and POC. Informed consent. Rationale for interventions, safe/appropriate HEP performance, post op precautions and self care as above 07/18/23: HEP 08/06/23 reassessment findings, HEP, POC Person educated:  Patient Education method: Explanation, Demonstration, Tactile cues, Verbal cues Education comprehension: verbalized understanding, returned demonstration, verbal cues required, tactile cues required, and needs further education    HOME EXERCISE PROGRAM: Access Code: [email or web address] URL: [email or web address] Date: 05/09/2023 Prepared by: Alm Jenny  Exercises - Seated Elbow Flexion AAROM  - 2-3 x daily - 7 x weekly - 1 sets - 10 reps - Seated AAROM Elbow Supination/Pronation with Clasped Hands  - 2-3 x daily - 7 x weekly - 1 sets - 10 reps - Seated Gripping Towel  - 2-3 x daily - 7 x weekly - 1 sets - 10 reps  ASSESSMENT:  CLINICAL IMPRESSION: Patient has met 2/2 short term goals and 6/6 long term goals with ability to complete HEP and improvement in symptoms, strength, ROM, activity tolerance, and functional mobility. Remaining deficits are in strength although much improved. 2 new goals added for strength and return to full activity without restriction as patient still limited. Extending POC 1-2 x/week for 6 weeks. Continued with shoulder strengthening today which is tolerated well with intermittent cueing for mechanics.  Patient will continue to benefit from skilled physical therapy in order to improve function and reduce impairment.       OBJECTIVE IMPAIRMENTS: decreased activity tolerance, decreased mobility, decreased ROM, decreased strength, impaired perceived functional ability, impaired UE functional use, postural dysfunction, and pain.   ACTIVITY LIMITATIONS: carrying, lifting, sleeping, bathing, dressing, reach over head, and hygiene/grooming  PARTICIPATION LIMITATIONS: meal prep, cleaning, laundry, driving, community activity, and occupation  PERSONAL FACTORS: Age and Time since onset of injury/illness/exacerbation are also affecting patient's functional outcome.   REHAB POTENTIAL: Good  CLINICAL DECISION MAKING: Stable/uncomplicated  EVALUATION COMPLEXITY:  Low   GOALS:   SHORT TERM GOALS: Target date: 06/20/2023   Pt will demonstrate appropriate understanding and performance of initially prescribed HEP in order to facilitate improved independence with management of symptoms.  Baseline: HEP established  Goal status: MET 5/15   2. Pt will report at least 25% improvement in overall pain levels over past week in order to facilitate improved tolerance to typical daily activities.   Baseline: 1-7/10  Goal status: MET  LONG TERM GOALS: Target date: 08/01/2023 Pt will score less than or equal to 40% on Quick DASH in order to indicate reduced levels of disability due to shoulder pain (MDC 16-20pts).  Baseline: 75% 08/06/23 13.6%  Goal status: INITIAL  2.  Pt will demonstrate at least 150 degrees of active shoulder elevation on surgical limb in order to demonstrate improved tolerance to functional movement patterns such as reaching overhead.  Baseline: see ROM chart above Goal status: MET  3.  Pt will demonstrate at least 4+/5 shoulder flex/abd MMT for improved symmetry of UE strength and improved tolerance to functional movements.  Baseline: deferred on eval given surgical protocol Goal status: MET  4. Pt will report ability to perform upper body dressing w/ less than 2 pt increase in pain in order to facilitate improved tolerance to ADLs.  Baseline: 1-7/10 pain with usual activities, inc time/difficulty performing in clinic  Goal status: MET   5. Pt will report at least 50% decrease in overall pain levels in past week in order to facilitate improved tolerance to basic ADLs/mobility.   Baseline: 1-7/10  Goal status: MET  6. Pt will endorse ability to perform usual household tasks with no more than 2 pt increase in pain in order to return to PLOF.   Baseline: housework limited as expected post op  Goal status: MET  7. Patient will demonstrate grade of 5/5 MMT grade in all tested musculature as evidence of improved strength to assist with  lifting at home and facilitate return to MMA.   Baseline:   Goal status: INITIAL 8. Patient will be able to return to all activities unrestricted for improved ability to perform work functions and recreation.  Baseline:   Goal status: INITIAL  PLAN:  PT FREQUENCY: 1-2x/week  PT DURATION: 6 weeks  PLANNED INTERVENTIONS: 02835- PT Re-evaluation, 97110-Therapeutic exercises, 97530- Therapeutic activity, 97112- Neuromuscular re-education, 97535- Self Care, 02859- Manual therapy, [id number]- Electrical stimulation (unattended), Patient/Family education, Taping, Dry Needling, Joint mobilization, Spinal mobilization, Scar mobilization, Cryotherapy, and Moist heat  PLAN FOR NEXT SESSION: continue to progress as able/appropriate per Bokshan shoulder labral repair protocol   Prentice GORMAN Stains, PT, DPT 08/06/2023, 7:57 AM

## 2023-08-08 ENCOUNTER — Ambulatory Visit (INDEPENDENT_AMBULATORY_CARE_PROVIDER_SITE_OTHER): Admitting: Orthopaedic Surgery

## 2023-08-08 ENCOUNTER — Ambulatory Visit (HOSPITAL_BASED_OUTPATIENT_CLINIC_OR_DEPARTMENT_OTHER): Admitting: Physical Therapy

## 2023-08-08 ENCOUNTER — Encounter (HOSPITAL_BASED_OUTPATIENT_CLINIC_OR_DEPARTMENT_OTHER): Payer: Self-pay | Admitting: Physical Therapy

## 2023-08-08 DIAGNOSIS — M25512 Pain in left shoulder: Secondary | ICD-10-CM | POA: Diagnosis not present

## 2023-08-08 DIAGNOSIS — M6281 Muscle weakness (generalized): Secondary | ICD-10-CM

## 2023-08-08 DIAGNOSIS — M25612 Stiffness of left shoulder, not elsewhere classified: Secondary | ICD-10-CM

## 2023-08-08 DIAGNOSIS — M25312 Other instability, left shoulder: Secondary | ICD-10-CM

## 2023-08-08 NOTE — Progress Notes (Signed)
 Post Operative Evaluation    Procedure/Date of Surgery: Left shoulder arthroscopy with labral repair 4/1  Interval History:  Presents today 6 weeks status post the above procedure.  Overall he is doing extremely well.  He has been progressing well through physical therapy and his strength is returning nicely.  He has full active range of motion.   PMH/PSH/Family History/Social History/Meds/Allergies:    Past Medical History:  Diagnosis Date   BPH (benign prostatic hyperplasia)    Complication of anesthesia    woke up aggressive after hernia surgery   Inguinal hernia    Labral tear of shoulder    Past Surgical History:  Procedure Laterality Date   HERNIA REPAIR     SHOULDER ARTHROSCOPY WITH LABRAL REPAIR Left 05/07/2023   Procedure: ARTHROSCOPY, SHOULDER, WITH GLENOID LABRUM REPAIR;  Surgeon: Genelle Standing, MD;  Location: [facility];  Service: Orthopedics;  Laterality: Left;   Social History   Socioeconomic History   Marital status: Married    Spouse name: Waddell   Number of children: Not on file   Years of education: Not on file   Highest education level: Not on file  Occupational History   Not on file  Tobacco Use   Smoking status: Former    Current packs/day: 0.00    Average packs/day: 0.5 packs/day for 0.7 years (0.4 ttl pk-yrs)    Types: Cigarettes    Start date: 06/30/2017    Quit date: 03/31/2018    Years since quitting: 5.3   Smokeless tobacco: Never  Vaping Use   Vaping status: Never Used  Substance and Sexual Activity   Alcohol use: Yes    Comment: 6/wk Beer and wine   Drug use: Never   Sexual activity: Yes  Other Topics Concern   Not on file  Social History Narrative   Not on file   Social Drivers of Health   Financial Resource Strain: Not on file  Food Insecurity: Not on file  Transportation Needs: Not on file  Physical Activity: Not on file  Stress: Not on file  Social Connections: Not on file    Family History  Problem Relation Age of Onset   Depression Sister        Manic Depression   Heart disease Brother    Liver cancer Paternal Grandmother        ?lung cancer   Liver cancer Paternal Grandfather    No Known Allergies Current Outpatient Medications  Medication Sig Dispense Refill   Amphetamine ER (ADZENYS XR-ODT) 18.8 MG TBED Take by mouth daily.     Biotin 1 MG CAPS Take by mouth.     Glucosamine-Chondroitin (GLUCOSAMINE CHONDR COMPLEX PO) Take 1 tablet by mouth daily in the afternoon.     Multiple Vitamin (MULTIVITAMIN) tablet Take 1 tablet by mouth daily.     tamsulosin (FLOMAX) 0.4 MG CAPS capsule Take 0.4 mg by mouth.     No current facility-administered medications for this visit.   No results found.  Review of Systems:   A ROS was performed including pertinent positives and negatives as documented in the HPI.   Musculoskeletal Exam:    There were no vitals taken for this visit.  Left shoulder is well-appearing.  Incisions are without redness or erythema active forward elevation is 160 degrees without pain equal  to the contralateral side external rotation at the side is to 45 degrees nearly equal to the contralateral and internal rotation is to L1 internal rotation deferred today distal neurosensory exam is intact  Imaging:      I personally reviewed and interpreted the radiographs.   Assessment:   12-week status post left shoulder labral repair overall doing very well.  Range of motion is full at this time.  Strengthening is coming along nicely.  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  I will plan to see him back as needed  Plan :    - Return to clinic as needed      I personally saw and evaluated the patient, and participated in the management and treatment plan.  Elspeth Parker, MD Attending Physician, Orthopedic Surgery  This document was dictated using Dragon  voice recognition software. A reasonable attempt at proof reading has been made to minimize errors.

## 2023-08-08 NOTE — Therapy (Signed)
 OUTPATIENT PHYSICAL THERAPY SHOULDER TREATMENT   Patient Name: Seth [facility] MRN: [id number] DOB:1990-12-13, 33 y.o., male Today's Date: 08/08/2023  Progress Note   Reporting Period 05/09/23 to 08/06/23   See note below for Objective Data and Assessment of Progress/Goals   END OF SESSION:  PT End of Session - 08/08/23 0720     Visit Number 20    Number of Visits 31    Date for PT Re-Evaluation 09/17/23    Authorization Type BCBS    PT Start Time 0718    PT Stop Time 0803    PT Time Calculation (min) 45 min    Activity Tolerance Patient tolerated treatment well    Behavior During Therapy Seth [facility] for tasks assessed/performed             Past Medical History:  Diagnosis Date   BPH (benign prostatic hyperplasia)    Complication of anesthesia    woke up aggressive after hernia surgery   Inguinal hernia    Labral tear of shoulder    Past Surgical History:  Procedure Laterality Date   HERNIA REPAIR     SHOULDER ARTHROSCOPY WITH LABRAL REPAIR Left 05/07/2023   Procedure: ARTHROSCOPY, SHOULDER, WITH GLENOID LABRUM REPAIR;  Surgeon: Seth Standing, MD;  Location: [facility];  Service: Orthopedics;  Laterality: Left;   Patient Active Problem List   Diagnosis Date Noted   Instability of left shoulder joint 05/07/2023   Chronic left shoulder pain 03/26/2023   Solitary pulmonary nodule 10/02/2018   Dyspnea 10/02/2018   Scoliosis deformity of spine 09/21/2017    PCP: Seth Lukes, PA  REFERRING PROVIDER: Genelle Standing, MD  REFERRING DIAG: [phone] (ICD-10-CM) - Instability of left shoulder joint  THERAPY DIAG:  Left shoulder pain, unspecified chronicity  Stiffness of left shoulder, not elsewhere classified  Muscle weakness (generalized)  Rationale for Evaluation and Treatment: Rehabilitation  ONSET DATE: L shoulder labral repair 05/07/23  SUBJECTIVE:                                                                                                                                                                                       SUBJECTIVE STATEMENT: Patient states doing HEP. Shoulder is doing well.    Hand dominance: Right  PERTINENT HISTORY: BPH, L labral repair DOS 05/07/23  PAIN:  Are you having pain: 0/10 stiff Location/description: L anterolateral shoulder  Best-worst over past week: 1/10  - aggravating factors: rolling on shoulder when sleeping - Easing factors: medication, icing    PRECAUTIONS: L shoulder [facility]an labral repair protocol  RED FLAGS: None   WEIGHT BEARING RESTRICTIONS: Yes NWB  FALLS:  Has patient fallen in last  6 months? No  LIVING ENVIRONMENT: 1 story home + basement w/ spouse who works  Pt usually does most of cooking, otherwise split housework Has two dogs, beagle and mini aussie   OCCUPATION: Attorney full time  PLOF: Independent - enjoys MMA, going to gym, walk dogs  PATIENT GOALS: get back to PLOF, full mobility, get back in gym   NEXT MD VISIT: 05/29/23  OBJECTIVE:  Note: Objective measures were completed at Evaluation unless otherwise noted.  DIAGNOSTIC FINDINGS:  DOS 05/07/23 labral repair L shoulder  PATIENT SURVEYS:  Seth [facility] 75% 08/06/23 13.6%  COGNITION: Overall cognitive status: Within functional limits for tasks assessed     SENSATION: Denies sensory complaints today  POSTURE: Sling donned ; guarded LUE posture as expected post op  UPPER EXTREMITY ROM:  Passive ROM  Right eval Left eval Left AROM 06/06/23 Left AROM 08/06/23  Shoulder flexion  P: <25 deg limited by muscle guarding 130 improves to 150 154 AAROM 160  Shoulder abduction  P: ~30 deg limited by muscle guarding    156  Shoulder internal rotation      Shoulder external rotation (at side unless otherwise noted)    55  Elbow flexion      Elbow extension      Wrist flexion      Wrist extension       (Blank rows = not tested) (Key: WFL = within functional limits not formally assessed, * = concordant  pain, s = stiffness/stretching sensation, NT = not tested)  Comments:   UPPER EXTREMITY MMT:  MMT Right eval Left eval Right 08/06/23 Left 08/06/23  Shoulder flexion   5 5  Shoulder extension      Shoulder abduction   5 5  Shoulder adduction      Shoulder internal rotation   5 5  Shoulder external rotation   5 4+  Middle trapezius      Lower trapezius      Elbow flexion   5 4+  Elbow extension   5 5  Wrist flexion      Wrist extension      Wrist ulnar deviation      Wrist radial deviation      Wrist pronation      Wrist supination      Grip strength (lbs)      (Blank rows = not tested) (Key: WFL = within functional limits not formally assessed, * = concordant pain, s = stiffness/stretching sensation, NT = not tested)  Comments: deferred on eval given post op protocol and acuity of surgery   PALPATION/OBSERVATION:  3 portal incisions (anterior, lateral, and posterior) appear grossly WNL, mild bleeding apparent anterior incision but not excessive. No significant erythema or swelling apparent, no excessive warmth.  Able to doff sling w/o assist, inc time/effort and guarding noted of LUE Min assist to don sling Able to perform upper body dressing for wound inspection w/o assist, inc time/effort noted and guarding LUE  TREATMENT DATE:  08/08/23 UBE retro 2 minutes, fwd 2 minutes level 4 Lat pull down 75# 1 x 10, 100# 2 x 10 Weighted  ball tosses 1kg 2 x 20, 2kg 4 x 20 Reverse fly on incline bench 10# 3 x 10 Bicep curl 25# 2 x 8 High plank into downward dog 2 x 10    08/06/23 Reassessment and discussion of POC UBE retro 2 minutes, fwd 2 minutes level 4 [facility] ER at 90 abduction GTB 3 x 10 [facility] IR at 90 abduction GTB 3 x 10 High Row Black TB 3 x 10 Body blade: flexion, abduction, horizontal abduction, ER/IR neutral 2 x 30 seconds each [facility]  shoulder press 15# KB bell up 3 x 10  07/30/23 UBE retro 2 minutes, fwd 2 minutes [facility] shoulder flexion with perpendicular resistance BTB 2 x 10 [facility] shoulder abduction with perpendicular resistance BTB 2 x 10 Body blade: flexion, abduction, horizontal abduction, ER/IR neutral 3 x 30 seconds each [facility] ER at 90 abduction RTB 3 x 10 [facility] IR at 90 abduction RTB 3 x 10 [facility] shoulder press 10# KB bell up 3 x 10  07/25/23 Quadruped shoulder flexion 2 x 10 [facility] shoulder flexion with perpendicular resistance BTB 2 x 10 [facility] shoulder abduction with perpendicular resistance BTB 2 x 10 [facility] ER at 90 abduction RTB 3 x 10 [facility] shoulder flexion 5# 3 x 10 [facility] shoulder abduction 5# 3 x 10 [facility] shoulder press 10# KB bell up 3 x 10 Tricep dip 2 x 10   07/23/23 Quadruped shoulder flexion stretch 5 x 10 second holds [facility] shoulder flexion with perpendicular resistance BTB 2 x 10 [facility] shoulder abduction with perpendicular resistance BTB 2 x 10 [facility] shoulder flexion 5# 2 x 10 [facility] shoulder abduction 5# 2 x 10 Prone row into ER 1x 5 - discontinued due to impaired mechanics Manual: grade II-III AP glide in progressive ER at 90 abduction [facility] ER at 90 abduction RTB 2 x 10 [facility] shoulder press 5# KB bell up 3 x 10 [facility] shoulder flexion overhead RTB 3 x 10 Bicep curls 10# 3 x 10  07/18/23 Manual:Grade II-III inferior glide in progressive flexion/abduction; TPR subscap Supine shoulder flexion 3# 2 x 10 Quadruped shoulder flexion stretch 5 x 10 second holds [facility] shoulder flexion 3# 2 x 10 [facility] shoulder abduction 3# 2 x 10 [facility] shoulder press 5# 3 x 10 High plank on knees with shoulder taps 2 x 10 [facility] ER at 90 abduction RTB 2 x 10      PATIENT EDUCATION: Education details: Pt education on PT impairments, prognosis, and POC. Informed consent. Rationale for interventions, safe/appropriate HEP performance, post op  precautions and self care as above 07/18/23: HEP 08/06/23 reassessment findings, HEP, POC Person educated: Patient Education method: Explanation, Demonstration, Tactile cues, Verbal cues Education comprehension: verbalized understanding, returned demonstration, verbal cues required, tactile cues required, and needs further education    HOME EXERCISE PROGRAM: Access Code: [email or web address] URL: [email or web address] Date: 05/09/2023 Prepared by: Alm Jenny  Exercises - Seated Elbow Flexion AAROM  - 2-3 x daily - 7 x weekly - 1 sets - 10 reps - Seated AAROM Elbow Supination/Pronation with Clasped Hands  - 2-3 x daily - 7 x weekly - 1 sets - 10 reps - Seated Gripping Towel  - 2-3 x daily - 7 x weekly - 1 sets - 10 reps  ASSESSMENT:  CLINICAL IMPRESSION: Patient continues to progress well into additional strengthening, plyometics and closed chain exercises. Cueing provided  for avoiding compensatory strategies.  Discussed progress and continued return to gym routine. Patient will continue to benefit from physical therapy in order to improve function and reduce impairment.      OBJECTIVE IMPAIRMENTS: decreased activity tolerance, decreased mobility, decreased ROM, decreased strength, impaired perceived functional ability, impaired UE functional use, postural dysfunction, and pain.   ACTIVITY LIMITATIONS: carrying, lifting, sleeping, bathing, dressing, reach over head, and hygiene/grooming  PARTICIPATION LIMITATIONS: meal prep, cleaning, laundry, driving, community activity, and occupation  PERSONAL FACTORS: Age and Time since onset of injury/illness/exacerbation are also affecting patient's functional outcome.   REHAB POTENTIAL: Good  CLINICAL DECISION MAKING: Stable/uncomplicated  EVALUATION COMPLEXITY: Low   GOALS:   SHORT TERM GOALS: Target date: 06/20/2023   Pt will demonstrate appropriate understanding and performance of initially prescribed HEP in order to facilitate  improved independence with management of symptoms.  Baseline: HEP established  Goal status: MET 5/15   2. Pt will report at least 25% improvement in overall pain levels over past week in order to facilitate improved tolerance to typical daily activities.   Baseline: 1-7/10  Goal status: MET  LONG TERM GOALS: Target date: 08/01/2023 Pt will score less than or equal to 40% on Quick DASH in order to indicate reduced levels of disability due to shoulder pain (MDC 16-20pts).  Baseline: 75% 08/06/23 13.6%  Goal status: INITIAL  2.  Pt will demonstrate at least 150 degrees of active shoulder elevation on surgical limb in order to demonstrate improved tolerance to functional movement patterns such as reaching overhead.  Baseline: see ROM chart above Goal status: MET  3.  Pt will demonstrate at least 4+/5 shoulder flex/abd MMT for improved symmetry of UE strength and improved tolerance to functional movements.  Baseline: deferred on eval given surgical protocol Goal status: MET  4. Pt will report ability to perform upper body dressing w/ less than 2 pt increase in pain in order to facilitate improved tolerance to ADLs.  Baseline: 1-7/10 pain with usual activities, inc time/difficulty performing in clinic  Goal status: MET   5. Pt will report at least 50% decrease in overall pain levels in past week in order to facilitate improved tolerance to basic ADLs/mobility.   Baseline: 1-7/10  Goal status: MET  6. Pt will endorse ability to perform usual household tasks with no more than 2 pt increase in pain in order to return to PLOF.   Baseline: housework limited as expected post op  Goal status: MET  7. Patient will demonstrate grade of 5/5 MMT grade in all tested musculature as evidence of improved strength to assist with lifting at home and facilitate return to MMA.   Baseline:   Goal status: INITIAL 8. Patient will be able to return to all activities unrestricted for improved ability to perform  work functions and recreation.  Baseline:   Goal status: INITIAL  PLAN:  PT FREQUENCY: 1-2x/week  PT DURATION: 6 weeks  PLANNED INTERVENTIONS: 02835- PT Re-evaluation, 97110-Therapeutic exercises, 97530- Therapeutic activity, 97112- Neuromuscular re-education, 97535- Self Care, 02859- Manual therapy, [id number]- Electrical stimulation (unattended), Patient/Family education, Taping, Dry Needling, Joint mobilization, Spinal mobilization, Scar mobilization, Cryotherapy, and Moist heat  PLAN FOR NEXT SESSION: continue to progress as able/appropriate per [facility] shoulder labral repair protocol   Prentice RAMAN Angelyna Henderson, PT, DPT 08/08/2023, 8:10 AM

## 2023-08-12 ENCOUNTER — Encounter (HOSPITAL_BASED_OUTPATIENT_CLINIC_OR_DEPARTMENT_OTHER): Payer: Self-pay

## 2023-08-13 ENCOUNTER — Ambulatory Visit (HOSPITAL_BASED_OUTPATIENT_CLINIC_OR_DEPARTMENT_OTHER): Admitting: Physical Therapy

## 2023-08-13 ENCOUNTER — Encounter (HOSPITAL_BASED_OUTPATIENT_CLINIC_OR_DEPARTMENT_OTHER): Payer: Self-pay | Admitting: Physical Therapy

## 2023-08-13 DIAGNOSIS — M6281 Muscle weakness (generalized): Secondary | ICD-10-CM

## 2023-08-13 DIAGNOSIS — M25612 Stiffness of left shoulder, not elsewhere classified: Secondary | ICD-10-CM

## 2023-08-13 DIAGNOSIS — M25512 Pain in left shoulder: Secondary | ICD-10-CM | POA: Diagnosis not present

## 2023-08-13 NOTE — Therapy (Signed)
 OUTPATIENT PHYSICAL THERAPY SHOULDER TREATMENT   Patient Name: Seth Shields MRN: [id number] DOB:09/06/1990, 33 y.o., male Today's Date: 08/13/2023    END OF SESSION:  PT End of Session - 08/13/23 0724     Visit Number 21    Number of Visits 31    Date for PT Re-Evaluation 09/17/23    Authorization Type BCBS    PT Start Time 0722    PT Stop Time 0800    PT Time Calculation (min) 38 min    Activity Tolerance Patient tolerated treatment well    Behavior During Therapy WFL for tasks assessed/performed             Past Medical History:  Diagnosis Date   BPH (benign prostatic hyperplasia)    Complication of anesthesia    woke up aggressive after hernia surgery   Inguinal hernia    Labral tear of shoulder    Past Surgical History:  Procedure Laterality Date   HERNIA REPAIR     SHOULDER ARTHROSCOPY WITH LABRAL REPAIR Left 05/07/2023   Procedure: ARTHROSCOPY, SHOULDER, WITH GLENOID LABRUM REPAIR;  Surgeon: Seth Standing, MD;  Location: [facility];  Service: Orthopedics;  Laterality: Left;   Patient Active Problem List   Diagnosis Date Noted   Instability of left shoulder joint 05/07/2023   Chronic left shoulder pain 03/26/2023   Solitary pulmonary nodule 10/02/2018   Dyspnea 10/02/2018   Scoliosis deformity of spine 09/21/2017    PCP: Seth Lukes, PA  REFERRING PROVIDER: Genelle Standing, MD  REFERRING DIAG: [phone] (ICD-10-CM) - Instability of left shoulder joint  THERAPY DIAG:  Left shoulder pain, unspecified chronicity  Stiffness of left shoulder, not elsewhere classified  Muscle weakness (generalized)  Rationale for Evaluation and Treatment: Rehabilitation  ONSET DATE: L shoulder labral repair 05/07/23  SUBJECTIVE:                                                                                                                                                                                      SUBJECTIVE STATEMENT: Patient states  shoulder doing well. Hasn't been to the gym as much as he would have liked but doing HEP.    Hand dominance: Right  PERTINENT HISTORY: BPH, L labral repair DOS 05/07/23  PAIN:  Are you having pain: 0/10 stiff Location/description: L anterolateral shoulder  Best-worst over past week: 1/10  - aggravating factors: rolling on shoulder when sleeping - Easing factors: medication, icing    PRECAUTIONS: L shoulder Bokshan labral repair protocol  RED FLAGS: None   WEIGHT BEARING RESTRICTIONS: Yes NWB  FALLS:  Has patient fallen in last 6 months? No  LIVING ENVIRONMENT: 1 story home +  basement w/ spouse who works  Pt usually does most of cooking, otherwise split housework Has two dogs, beagle and mini aussie   OCCUPATION: Attorney full time  PLOF: Independent - enjoys MMA, going to gym, walk dogs  PATIENT GOALS: get back to PLOF, full mobility, get back in gym   NEXT MD VISIT: 05/29/23  OBJECTIVE:  Note: Objective measures were completed at Evaluation unless otherwise noted.  DIAGNOSTIC FINDINGS:  DOS 05/07/23 labral repair L shoulder  PATIENT SURVEYS:  Seth Shields 75% 08/06/23 13.6%  COGNITION: Overall cognitive status: Within functional limits for tasks assessed     SENSATION: Denies sensory complaints today  POSTURE: Sling donned ; guarded LUE posture as expected post op  UPPER EXTREMITY ROM:  Passive ROM  Right eval Left eval Left AROM 06/06/23 Left AROM 08/06/23  Shoulder flexion  P: <25 deg limited by muscle guarding 130 improves to 150 154 AAROM 160  Shoulder abduction  P: ~30 deg limited by muscle guarding    156  Shoulder internal rotation      Shoulder external rotation (at side unless otherwise noted)    55  Elbow flexion      Elbow extension      Wrist flexion      Wrist extension       (Blank rows = not tested) (Key: WFL = within functional limits not formally assessed, * = concordant pain, s = stiffness/stretching sensation, NT = not tested)   Comments:   UPPER EXTREMITY MMT:  MMT Right eval Left eval Right 08/06/23 Left 08/06/23  Shoulder flexion   5 5  Shoulder extension      Shoulder abduction   5 5  Shoulder adduction      Shoulder internal rotation   5 5  Shoulder external rotation   5 4+  Middle trapezius      Lower trapezius      Elbow flexion   5 4+  Elbow extension   5 5  Wrist flexion      Wrist extension      Wrist ulnar deviation      Wrist radial deviation      Wrist pronation      Wrist supination      Grip strength (lbs)      (Blank rows = not tested) (Key: WFL = within functional limits not formally assessed, * = concordant pain, s = stiffness/stretching sensation, NT = not tested)  Comments: deferred on eval given post op protocol and acuity of surgery   PALPATION/OBSERVATION:  3 portal incisions (anterior, lateral, and posterior) appear grossly WNL, mild bleeding apparent anterior incision but not excessive. No significant erythema or swelling apparent, no excessive warmth.  Able to doff sling w/o assist, inc time/effort and guarding noted of LUE Min assist to don sling Able to perform upper body dressing for wound inspection w/o assist, inc time/effort noted and guarding LUE  TREATMENT DATE:  08/13/23 UBE retro 2 minutes, fwd 2 minutes level 4 Weighted  ball tosses 2kg 5 x 20 Seated arnold press 25# 1 x 10, 20# 2x10  Reverse fly on roman chair 10# 3 x 10 Plank on bosu 3 x 30 second holds High plank shoulder taps 2 x 10  High plank weight passes 10# 2 x 10   08/08/23 UBE retro 2 minutes, fwd 2 minutes level 4 Lat pull down 75# 1 x 10, 100# 2 x 10 Weighted  ball tosses 1kg 2 x 20, 2kg 4 x 20 Reverse fly on incline bench 10# 3 x 10 Bicep curl 25# 2 x 8 High plank into downward dog 2 x 10    08/06/23 Reassessment and discussion of POC UBE retro 2 minutes, fwd 2  minutes level 4 Shields ER at 90 abduction GTB 3 x 10 Shields IR at 90 abduction GTB 3 x 10 High Row Black TB 3 x 10 Body blade: flexion, abduction, horizontal abduction, ER/IR neutral 2 x 30 seconds each Shields shoulder press 15# KB bell up 3 x 10  07/30/23 UBE retro 2 minutes, fwd 2 minutes Shields shoulder flexion with perpendicular resistance BTB 2 x 10 Shields shoulder abduction with perpendicular resistance BTB 2 x 10 Body blade: flexion, abduction, horizontal abduction, ER/IR neutral 3 x 30 seconds each Shields ER at 90 abduction RTB 3 x 10 Shields IR at 90 abduction RTB 3 x 10 Shields shoulder press 10# KB bell up 3 x 10  07/25/23 Quadruped shoulder flexion 2 x 10 Shields shoulder flexion with perpendicular resistance BTB 2 x 10 Shields shoulder abduction with perpendicular resistance BTB 2 x 10 Shields ER at 90 abduction RTB 3 x 10 Shields shoulder flexion 5# 3 x 10 Shields shoulder abduction 5# 3 x 10 Shields shoulder press 10# KB bell up 3 x 10 Tricep dip 2 x 10   07/23/23 Quadruped shoulder flexion stretch 5 x 10 second holds Shields shoulder flexion with perpendicular resistance BTB 2 x 10 Shields shoulder abduction with perpendicular resistance BTB 2 x 10 Shields shoulder flexion 5# 2 x 10 Shields shoulder abduction 5# 2 x 10 Prone row into ER 1x 5 - discontinued due to impaired mechanics Manual: grade II-III AP glide in progressive ER at 90 abduction Shields ER at 90 abduction RTB 2 x 10 Shields shoulder press 5# KB bell up 3 x 10 Shields shoulder flexion overhead RTB 3 x 10 Bicep curls 10# 3 x 10  07/18/23 Manual:Grade II-III inferior glide in progressive flexion/abduction; TPR subscap Supine shoulder flexion 3# 2 x 10 Quadruped shoulder flexion stretch 5 x 10 second holds Shields shoulder flexion 3# 2 x 10 Shields shoulder abduction 3# 2 x 10 Shields shoulder press 5# 3 x 10 High plank on knees with shoulder taps 2 x 10 Shields ER  at 90 abduction RTB 2 x 10      PATIENT EDUCATION: Education details: Pt education on PT impairments, prognosis, and POC. Informed consent. Rationale for interventions, safe/appropriate HEP performance, post op precautions and self care as above 07/18/23: HEP 08/06/23 reassessment findings, HEP, POC Person educated: Patient Education method: Explanation, Demonstration, Tactile cues, Verbal cues Education comprehension: verbalized understanding, returned demonstration, verbal cues required, tactile cues required, and needs further education    HOME EXERCISE PROGRAM: Access Code: [email or web address] URL: [email or web address] Date: 05/09/2023 Prepared by: Alm Jenny  Exercises - Seated Elbow Flexion AAROM  - 2-3 x daily - 7 x weekly - 1 sets - 10  reps - Seated AAROM Elbow Supination/Pronation with Clasped Hands  - 2-3 x daily - 7 x weekly - 1 sets - 10 reps - Seated Gripping Towel  - 2-3 x daily - 7 x weekly - 1 sets - 10 reps  ASSESSMENT:  CLINICAL IMPRESSION: Patient continues to progress well into additional strengthening, plyometics and closed chain exercises. Cueing provided for avoiding compensatory strategies.  Discussed progress and continued return to gym routine. Patient will continue to benefit from physical therapy in order to improve function and reduce impairment.      OBJECTIVE IMPAIRMENTS: decreased activity tolerance, decreased mobility, decreased ROM, decreased strength, impaired perceived functional ability, impaired UE functional use, postural dysfunction, and pain.   ACTIVITY LIMITATIONS: carrying, lifting, sleeping, bathing, dressing, reach over head, and hygiene/grooming  PARTICIPATION LIMITATIONS: meal prep, cleaning, laundry, driving, community activity, and occupation  PERSONAL FACTORS: Age and Time since onset of injury/illness/exacerbation are also affecting patient's functional outcome.   REHAB POTENTIAL: Good  CLINICAL DECISION MAKING:  Stable/uncomplicated  EVALUATION COMPLEXITY: Low   GOALS:   SHORT TERM GOALS: Target date: 06/20/2023   Pt will demonstrate appropriate understanding and performance of initially prescribed HEP in order to facilitate improved independence with management of symptoms.  Baseline: HEP established  Goal status: MET 5/15   2. Pt will report at least 25% improvement in overall pain levels over past week in order to facilitate improved tolerance to typical daily activities.   Baseline: 1-7/10  Goal status: MET  LONG TERM GOALS: Target date: 08/01/2023 Pt will score less than or equal to 40% on Quick DASH in order to indicate reduced levels of disability due to shoulder pain (MDC 16-20pts).  Baseline: 75% 08/06/23 13.6%  Goal status: INITIAL  2.  Pt will demonstrate at least 150 degrees of active shoulder elevation on surgical limb in order to demonstrate improved tolerance to functional movement patterns such as reaching overhead.  Baseline: see ROM chart above Goal status: MET  3.  Pt will demonstrate at least 4+/5 shoulder flex/abd MMT for improved symmetry of UE strength and improved tolerance to functional movements.  Baseline: deferred on eval given surgical protocol Goal status: MET  4. Pt will report ability to perform upper body dressing w/ less than 2 pt increase in pain in order to facilitate improved tolerance to ADLs.  Baseline: 1-7/10 pain with usual activities, inc time/difficulty performing in clinic  Goal status: MET   5. Pt will report at least 50% decrease in overall pain levels in past week in order to facilitate improved tolerance to basic ADLs/mobility.   Baseline: 1-7/10  Goal status: MET  6. Pt will endorse ability to perform usual household tasks with no more than 2 pt increase in pain in order to return to PLOF.   Baseline: housework limited as expected post op  Goal status: MET  7. Patient will demonstrate grade of 5/5 MMT grade in all tested musculature as  evidence of improved strength to assist with lifting at home and facilitate return to MMA.   Baseline:   Goal status: INITIAL 8. Patient will be able to return to all activities unrestricted for improved ability to perform work functions and recreation.  Baseline:   Goal status: INITIAL  PLAN:  PT FREQUENCY: 1-2x/week  PT DURATION: 6 weeks  PLANNED INTERVENTIONS: 97164- PT Re-evaluation, 97110-Therapeutic exercises, 97530- Therapeutic activity, 97112- Neuromuscular re-education, 97535- Self Care, [id number]- Manual therapy, [id number]- Electrical stimulation (unattended), Patient/Family education, Taping, Dry Needling, Joint mobilization, Spinal mobilization, Scar mobilization,  Cryotherapy, and Moist heat  PLAN FOR NEXT SESSION: continue to progress as able/appropriate per Bokshan shoulder labral repair protocol   Prentice RAMAN Zana Biancardi, PT, DPT 08/13/2023, 7:25 AM

## 2023-08-15 ENCOUNTER — Encounter (HOSPITAL_BASED_OUTPATIENT_CLINIC_OR_DEPARTMENT_OTHER): Admitting: Physical Therapy

## 2023-08-16 ENCOUNTER — Encounter (HOSPITAL_BASED_OUTPATIENT_CLINIC_OR_DEPARTMENT_OTHER): Payer: Self-pay | Admitting: Physical Therapy

## 2023-08-16 ENCOUNTER — Ambulatory Visit (HOSPITAL_BASED_OUTPATIENT_CLINIC_OR_DEPARTMENT_OTHER): Admitting: Physical Therapy

## 2023-08-16 DIAGNOSIS — M25512 Pain in left shoulder: Secondary | ICD-10-CM | POA: Diagnosis not present

## 2023-08-16 DIAGNOSIS — M25612 Stiffness of left shoulder, not elsewhere classified: Secondary | ICD-10-CM

## 2023-08-16 DIAGNOSIS — M6281 Muscle weakness (generalized): Secondary | ICD-10-CM

## 2023-08-16 NOTE — Therapy (Signed)
 OUTPATIENT PHYSICAL THERAPY SHOULDER TREATMENT   Patient Name: Seth Shields MRN: [id number] DOB:01-21-1991, 33 y.o., male Today's Date: 08/16/2023    END OF SESSION:  PT End of Session - 08/16/23 0725     Visit Number 22    Number of Visits 31    Date for PT Re-Evaluation 09/17/23    Authorization Type BC[employer]    PT Start Time 0724    PT Stop Time 0758    PT Time Calculation (min) 34 min    Activity Tolerance Patient tolerated treatment well    Behavior During Therapy WFL for tasks assessed/performed             Past Medical History:  Diagnosis Date   BPH (benign prostatic hyperplasia)    Complication of anesthesia    woke up aggressive after hernia surgery   Inguinal hernia    Labral tear of shoulder    Past Surgical History:  Procedure Laterality Date   HERNIA REPAIR     SHOULDER ARTHROSCOPY WITH LABRAL REPAIR Left 05/07/2023   Procedure: ARTHROSCOPY, SHOULDER, WITH GLENOID LABRUM REPAIR;  Surgeon: Seth Standing, MD;  Location: [facility];  Service: Orthopedics;  Laterality: Left;   Patient Active Problem List   Diagnosis Date Noted   Instability of left shoulder joint 05/07/2023   Chronic left shoulder pain 03/26/2023   Solitary pulmonary nodule 10/02/2018   Dyspnea 10/02/2018   Scoliosis deformity of spine 09/21/2017    PCP: Seth Lukes, PA  REFERRING PROVIDER: Genelle Standing, MD  REFERRING DIAG: [phone] (ICD-10-CM) - Instability of left shoulder joint  THERAPY DIAG:  Left shoulder pain, unspecified chronicity  Stiffness of left shoulder, not elsewhere classified  Muscle weakness (generalized)  Rationale for Evaluation and Treatment: Rehabilitation  ONSET DATE: L shoulder labral repair 05/07/23  SUBJECTIVE:                                                                                                                                                                                      SUBJECTIVE STATEMENT: Patient states  felt alright after last session. Went to gym yesterday.   Hand dominance: Right  PERTINENT HISTORY: BPH, L labral repair DOS 05/07/23  PAIN:  Are you having pain: 0/10 stiff Location/description: L anterolateral shoulder  Best-worst over past week: 1/10  - aggravating factors: rolling on shoulder when sleeping - Easing factors: medication, icing    PRECAUTIONS: L shoulder Bokshan labral repair protocol  RED FLAGS: None   WEIGHT BEARING RESTRICTIONS: Yes NWB  FALLS:  Has patient fallen in last 6 months? No  LIVING ENVIRONMENT: 1 story home + basement w/ spouse who works  Pt usually does  most of cooking, otherwise split housework Has two dogs, beagle and mini aussie   OCCUPATION: Attorney full time  PLOF: Independent - enjoys MMA, going to gym, walk dogs  PATIENT GOALS: get back to PLOF, full mobility, get back in gym   NEXT MD VISIT: 05/29/23  OBJECTIVE:  Note: Objective measures were completed at Evaluation unless otherwise noted.  DIAGNOSTIC FINDINGS:  DOS 05/07/23 labral repair L shoulder  PATIENT SURVEYS:  Seth Shields 75% 08/06/23 13.6%  COGNITION: Overall cognitive status: Within functional limits for tasks assessed     SENSATION: Denies sensory complaints today  POSTURE: Sling donned ; guarded LUE posture as expected post op  UPPER EXTREMITY ROM:  Passive ROM  Right eval Left eval Left AROM 06/06/23 Left AROM 08/06/23  Shoulder flexion  P: <25 deg limited by muscle guarding 130 improves to 150 154 AAROM 160  Shoulder abduction  P: ~30 deg limited by muscle guarding    156  Shoulder internal rotation      Shoulder external rotation (at side unless otherwise noted)    55  Elbow flexion      Elbow extension      Wrist flexion      Wrist extension       (Blank rows = not tested) (Key: WFL = within functional limits not formally assessed, * = concordant pain, s = stiffness/stretching sensation, NT = not tested)  Comments:   UPPER EXTREMITY MMT:  MMT  Right eval Left eval Right 08/06/23 Left 08/06/23  Shoulder flexion   5 5  Shoulder extension      Shoulder abduction   5 5  Shoulder adduction      Shoulder internal rotation   5 5  Shoulder external rotation   5 4+  Middle trapezius      Lower trapezius      Elbow flexion   5 4+  Elbow extension   5 5  Wrist flexion      Wrist extension      Wrist ulnar deviation      Wrist radial deviation      Wrist pronation      Wrist supination      Grip strength (lbs)      (Blank rows = not tested) (Key: WFL = within functional limits not formally assessed, * = concordant pain, s = stiffness/stretching sensation, NT = not tested)  Comments: deferred on eval given post op protocol and acuity of surgery   PALPATION/OBSERVATION:  3 portal incisions (anterior, lateral, and posterior) appear grossly WNL, mild bleeding apparent anterior incision but not excessive. No significant erythema or swelling apparent, no excessive warmth.  Able to doff sling w/o assist, inc time/effort and guarding noted of LUE Min assist to don sling Able to perform upper body dressing for wound inspection w/o assist, inc time/effort noted and guarding LUE  TREATMENT DATE:  08/16/23 UBE retro 2 minutes, fwd 2 minutes level 4 Weighted  ball tosses 2kg 5 x 20 Weighted ball toss and catch 3 x 10 Plank on bosu with weight shifts 3 x 10 Plank on bosu with mountain climbers 3 x 10 Side plank with rotation 3 x 10  08/13/23 UBE retro 2 minutes, fwd 2 minutes level 4 Weighted  ball tosses 2kg 5 x 20 Seated arnold press 25# 1 x 10, 20# 2x10  Reverse fly on roman chair 10# 3 x 10 Plank on bosu 3 x 30 second holds High plank shoulder taps 2 x 10  High plank weight passes 10# 2 x 10   08/08/23 UBE retro 2 minutes, fwd 2 minutes level 4 Lat pull down 75# 1 x 10, 100# 2 x 10 Weighted  ball tosses 1kg  2 x 20, 2kg 4 x 20 Reverse fly on incline bench 10# 3 x 10 Bicep curl 25# 2 x 8 High plank into downward dog 2 x 10    08/06/23 Reassessment and discussion of POC UBE retro 2 minutes, fwd 2 minutes level 4 Shields ER at 90 abduction GTB 3 x 10 Shields IR at 90 abduction GTB 3 x 10 High Row Black TB 3 x 10 Body blade: flexion, abduction, horizontal abduction, ER/IR neutral 2 x 30 seconds each Shields shoulder press 15# KB bell up 3 x 10  07/30/23 UBE retro 2 minutes, fwd 2 minutes Shields shoulder flexion with perpendicular resistance BTB 2 x 10 Shields shoulder abduction with perpendicular resistance BTB 2 x 10 Body blade: flexion, abduction, horizontal abduction, ER/IR neutral 3 x 30 seconds each Shields ER at 90 abduction RTB 3 x 10 Shields IR at 90 abduction RTB 3 x 10 Shields shoulder press 10# KB bell up 3 x 10  07/25/23 Quadruped shoulder flexion 2 x 10 Shields shoulder flexion with perpendicular resistance BTB 2 x 10 Shields shoulder abduction with perpendicular resistance BTB 2 x 10 Shields ER at 90 abduction RTB 3 x 10 Shields shoulder flexion 5# 3 x 10 Shields shoulder abduction 5# 3 x 10 Shields shoulder press 10# KB bell up 3 x 10 Tricep dip 2 x 10   07/23/23 Quadruped shoulder flexion stretch 5 x 10 second holds Shields shoulder flexion with perpendicular resistance BTB 2 x 10 Shields shoulder abduction with perpendicular resistance BTB 2 x 10 Shields shoulder flexion 5# 2 x 10 Shields shoulder abduction 5# 2 x 10 Prone row into ER 1x 5 - discontinued due to impaired mechanics Manual: grade II-III AP glide in progressive ER at 90 abduction Shields ER at 90 abduction RTB 2 x 10 Shields shoulder press 5# KB bell up 3 x 10 Shields shoulder flexion overhead RTB 3 x 10 Bicep curls 10# 3 x 10  07/18/23 Manual:Grade II-III inferior glide in progressive flexion/abduction; TPR subscap Supine shoulder flexion 3# 2 x 10 Quadruped shoulder flexion  stretch 5 x 10 second holds Shields shoulder flexion 3# 2 x 10 Shields shoulder abduction 3# 2 x 10 Shields shoulder press 5# 3 x 10 High plank on knees with shoulder taps 2 x 10 Shields ER at 90 abduction RTB 2 x 10      PATIENT EDUCATION: Education details: Pt education on PT impairments, prognosis, and POC. Informed consent. Rationale for interventions, safe/appropriate HEP performance, post op precautions and self care as above 07/18/23: HEP 08/06/23 reassessment findings, HEP, POC Person educated: Patient Education method: Explanation, Demonstration, Tactile cues, Verbal cues Education comprehension:  verbalized understanding, returned demonstration, verbal cues required, tactile cues required, and needs further education    HOME EXERCISE PROGRAM: Access Code: [email or web address] URL: [email or web address] Date: 05/09/2023 Prepared by: Alm Jenny  Exercises - Seated Elbow Flexion AAROM  - 2-3 x daily - 7 x weekly - 1 sets - 10 reps - Seated AAROM Elbow Supination/Pronation with Clasped Hands  - 2-3 x daily - 7 x weekly - 1 sets - 10 reps - Seated Gripping Towel  - 2-3 x daily - 7 x weekly - 1 sets - 10 reps  ASSESSMENT:  CLINICAL IMPRESSION: Patient progressing well with closed chain strengthening and plyometric exercise. Good motor control with sidelying plank exercise although some periscap symptoms. Patient will continue to benefit from physical therapy in order to improve function and reduce impairment.      OBJECTIVE IMPAIRMENTS: decreased activity tolerance, decreased mobility, decreased ROM, decreased strength, impaired perceived functional ability, impaired UE functional use, postural dysfunction, and pain.   ACTIVITY LIMITATIONS: carrying, lifting, sleeping, bathing, dressing, reach over head, and hygiene/grooming  PARTICIPATION LIMITATIONS: meal prep, cleaning, laundry, driving, community activity, and occupation  PERSONAL FACTORS: Age and Time since  onset of injury/illness/exacerbation are also affecting patient's functional outcome.   REHAB POTENTIAL: Good  CLINICAL DECISION MAKING: Stable/uncomplicated  EVALUATION COMPLEXITY: Low   GOALS:   SHORT TERM GOALS: Target date: 06/20/2023   Pt will demonstrate appropriate understanding and performance of initially prescribed HEP in order to facilitate improved independence with management of symptoms.  Baseline: HEP established  Goal status: MET 5/15   2. Pt will report at least 25% improvement in overall pain levels over past week in order to facilitate improved tolerance to typical daily activities.   Baseline: 1-7/10  Goal status: MET  LONG TERM GOALS: Target date: 08/01/2023 Pt will score less than or equal to 40% on Quick DASH in order to indicate reduced levels of disability due to shoulder pain (MDC 16-20pts).  Baseline: 75% 08/06/23 13.6%  Goal status: INITIAL  2.  Pt will demonstrate at least 150 degrees of active shoulder elevation on surgical limb in order to demonstrate improved tolerance to functional movement patterns such as reaching overhead.  Baseline: see ROM chart above Goal status: MET  3.  Pt will demonstrate at least 4+/5 shoulder flex/abd MMT for improved symmetry of UE strength and improved tolerance to functional movements.  Baseline: deferred on eval given surgical protocol Goal status: MET  4. Pt will report ability to perform upper body dressing w/ less than 2 pt increase in pain in order to facilitate improved tolerance to ADLs.  Baseline: 1-7/10 pain with usual activities, inc time/difficulty performing in clinic  Goal status: MET   5. Pt will report at least 50% decrease in overall pain levels in past week in order to facilitate improved tolerance to basic ADLs/mobility.   Baseline: 1-7/10  Goal status: MET  6. Pt will endorse ability to perform usual household tasks with no more than 2 pt increase in pain in order to return to PLOF.   Baseline:  housework limited as expected post op  Goal status: MET  7. Patient will demonstrate grade of 5/5 MMT grade in all tested musculature as evidence of improved strength to assist with lifting at home and facilitate return to MMA.   Baseline:   Goal status: INITIAL 8. Patient will be able to return to all activities unrestricted for improved ability to perform work functions and recreation.  Baseline:   Goal status: INITIAL  PLAN:  PT FREQUENCY: 1-2x/week  PT DURATION: 6 weeks  PLANNED INTERVENTIONS: 97164- PT Re-evaluation, 97110-Therapeutic exercises, 97530- Therapeutic activity, 97112- Neuromuscular re-education, 97535- Self Care, [id number]- Manual therapy, [id number]- Electrical stimulation (unattended), Patient/Family education, Taping, Dry Needling, Joint mobilization, Spinal mobilization, Scar mobilization, Cryotherapy, and Moist heat  PLAN FOR NEXT SESSION: continue to progress as able/appropriate per Bokshan shoulder labral repair protocol   Prentice RAMAN Aalijah Mims, PT, DPT 08/16/2023, 8:00 AM

## 2023-08-20 ENCOUNTER — Ambulatory Visit (HOSPITAL_BASED_OUTPATIENT_CLINIC_OR_DEPARTMENT_OTHER): Payer: Self-pay | Admitting: Physical Therapy

## 2023-08-20 ENCOUNTER — Encounter (HOSPITAL_BASED_OUTPATIENT_CLINIC_OR_DEPARTMENT_OTHER): Payer: Self-pay | Admitting: Physical Therapy

## 2023-08-20 DIAGNOSIS — M25612 Stiffness of left shoulder, not elsewhere classified: Secondary | ICD-10-CM

## 2023-08-20 DIAGNOSIS — M25512 Pain in left shoulder: Secondary | ICD-10-CM | POA: Diagnosis not present

## 2023-08-20 DIAGNOSIS — M6281 Muscle weakness (generalized): Secondary | ICD-10-CM

## 2023-08-20 NOTE — Therapy (Signed)
 OUTPATIENT PHYSICAL THERAPY SHOULDER TREATMENT   Patient Name: Seth Shields MRN: [id number] DOB:1990-10-27, 33 y.o., male Today's Date: 08/20/2023    END OF SESSION:  PT End of Session - 08/20/23 0720     Visit Number 23    Number of Visits 31    Date for PT Re-Evaluation 09/17/23    Authorization Type BCBS    PT Start Time 0718    PT Stop Time 0758    PT Time Calculation (min) 40 min    Activity Tolerance Patient tolerated treatment well    Behavior During Therapy WFL for tasks assessed/performed             Past Medical History:  Diagnosis Date   BPH (benign prostatic hyperplasia)    Complication of anesthesia    woke up aggressive after hernia surgery   Inguinal hernia    Labral tear of shoulder    Past Surgical History:  Procedure Laterality Date   HERNIA REPAIR     SHOULDER ARTHROSCOPY WITH LABRAL REPAIR Left 05/07/2023   Procedure: ARTHROSCOPY, SHOULDER, WITH GLENOID LABRUM REPAIR;  Surgeon: Genelle Standing, MD;  Location: [facility];  Service: Orthopedics;  Laterality: Left;   Patient Active Problem List   Diagnosis Date Noted   Instability of left shoulder joint 05/07/2023   Chronic left shoulder pain 03/26/2023   Solitary pulmonary nodule 10/02/2018   Dyspnea 10/02/2018   Scoliosis deformity of spine 09/21/2017    PCP: Job Lukes, PA  REFERRING PROVIDER: Genelle Standing, MD  REFERRING DIAG: [phone] (ICD-10-CM) - Instability of left shoulder joint  THERAPY DIAG:  Left shoulder pain, unspecified chronicity  Stiffness of left shoulder, not elsewhere classified  Muscle weakness (generalized)  Rationale for Evaluation and Treatment: Rehabilitation  ONSET DATE: L shoulder labral repair 05/07/23  SUBJECTIVE:                                                                                                                                                                                      SUBJECTIVE STATEMENT: Patient states was  sore after last session. Doing HEP and went to gym once. Tripped and used L arm to brace and it hurt shoulder quite a bit. IR motion at 90 abduction is still limited.    Hand dominance: Right  PERTINENT HISTORY: BPH, L labral repair DOS 05/07/23  PAIN:  Are you having pain: 0/10 stiff Location/description: L anterolateral shoulder  Best-worst over past week: 1/10  - aggravating factors: rolling on shoulder when sleeping - Easing factors: medication, icing    PRECAUTIONS: L shoulder Bokshan labral repair protocol  RED FLAGS: None   WEIGHT BEARING RESTRICTIONS: Yes NWB  FALLS:  Has patient fallen in last 6 months? No  LIVING ENVIRONMENT: 1 story home + basement w/ spouse who works  Pt usually does most of cooking, otherwise split housework Has two dogs, beagle and mini aussie   OCCUPATION: Attorney full time  PLOF: Independent - enjoys MMA, going to gym, walk dogs  PATIENT GOALS: get back to PLOF, full mobility, get back in gym   NEXT MD VISIT: 05/29/23  OBJECTIVE:  Note: Objective measures were completed at Evaluation unless otherwise noted.  DIAGNOSTIC FINDINGS:  DOS 05/07/23 labral repair L shoulder  PATIENT SURVEYS:  [name] 75% 08/06/23 13.6%  COGNITION: Overall cognitive status: Within functional limits for tasks assessed     SENSATION: Denies sensory complaints today  POSTURE: Sling donned ; guarded LUE posture as expected post op  UPPER EXTREMITY ROM:  Passive ROM  Right eval Left eval Left AROM 06/06/23 Left AROM 08/06/23  Shoulder flexion  P: <25 deg limited by muscle guarding 130 improves to 150 154 AAROM 160  Shoulder abduction  P: ~30 deg limited by muscle guarding    156  Shoulder internal rotation      Shoulder external rotation (at side unless otherwise noted)    55  Elbow flexion      Elbow extension      Wrist flexion      Wrist extension       (Blank rows = not tested) (Key: WFL = within functional limits not formally assessed, * =  concordant pain, s = stiffness/stretching sensation, NT = not tested)  Comments:   UPPER EXTREMITY MMT:  MMT Right eval Left eval Right 08/06/23 Left 08/06/23  Shoulder flexion   5 5  Shoulder extension      Shoulder abduction   5 5  Shoulder adduction      Shoulder internal rotation   5 5  Shoulder external rotation   5 4+  Middle trapezius      Lower trapezius      Elbow flexion   5 4+  Elbow extension   5 5  Wrist flexion      Wrist extension      Wrist ulnar deviation      Wrist radial deviation      Wrist pronation      Wrist supination      Grip strength (lbs)      (Blank rows = not tested) (Key: WFL = within functional limits not formally assessed, * = concordant pain, s = stiffness/stretching sensation, NT = not tested)  Comments: deferred on eval given post op protocol and acuity of surgery   PALPATION/OBSERVATION:  3 portal incisions (anterior, lateral, and posterior) appear grossly WNL, mild bleeding apparent anterior incision but not excessive. No significant erythema or swelling apparent, no excessive warmth.  Able to doff sling w/o assist, inc time/effort and guarding noted of LUE Min assist to don sling Able to perform upper body dressing for wound inspection w/o assist, inc time/effort noted and guarding LUE  TREATMENT DATE:  08/20/23 UBE retro 3 minutes, fwd 3 minutes level 4 Manual: grade III AP glides in progressive IR, Prone IR mobs grade III, STM to pec Standing ER at 90 abduction BTB 3 x 10 Standing IR at 90 abduction Black TB 3 x 10 High plank weight passes 10# 2 x 10 UE Y balance x 5  Supine pec stretch on 1/2 foam roll 3 x 30 second holds UE Y balance x 2  08/16/23 UBE retro 2 minutes, fwd 2 minutes level 4 Weighted  ball tosses 2kg 5 x 20 Weighted ball toss and catch 3 x 10 Plank on bosu with weight shifts 3 x 10 Plank  on bosu with mountain climbers 3 x 10 Side plank with rotation 3 x 10  08/13/23 UBE retro 2 minutes, fwd 2 minutes level 4 Weighted  ball tosses 2kg 5 x 20 Seated arnold press 25# 1 x 10, 20# 2x10  Reverse fly on roman chair 10# 3 x 10 Plank on bosu 3 x 30 second holds High plank shoulder taps 2 x 10  High plank weight passes 10# 2 x 10   08/08/23 UBE retro 2 minutes, fwd 2 minutes level 4 Lat pull down 75# 1 x 10, 100# 2 x 10 Weighted  ball tosses 1kg 2 x 20, 2kg 4 x 20 Reverse fly on incline bench 10# 3 x 10 Bicep curl 25# 2 x 8 High plank into downward dog 2 x 10    08/06/23 Reassessment and discussion of POC UBE retro 2 minutes, fwd 2 minutes level 4 Standing ER at 90 abduction GTB 3 x 10 Standing IR at 90 abduction GTB 3 x 10 High Row Black TB 3 x 10 Body blade: flexion, abduction, horizontal abduction, ER/IR neutral 2 x 30 seconds each Standing shoulder press 15# KB bell up 3 x 10  07/30/23 UBE retro 2 minutes, fwd 2 minutes Standing shoulder flexion with perpendicular resistance BTB 2 x 10 Standing shoulder abduction with perpendicular resistance BTB 2 x 10 Body blade: flexion, abduction, horizontal abduction, ER/IR neutral 3 x 30 seconds each Standing ER at 90 abduction RTB 3 x 10 Standing IR at 90 abduction RTB 3 x 10 Standing shoulder press 10# KB bell up 3 x 10  07/25/23 Quadruped shoulder flexion 2 x 10 Standing shoulder flexion with perpendicular resistance BTB 2 x 10 Standing shoulder abduction with perpendicular resistance BTB 2 x 10 Standing ER at 90 abduction RTB 3 x 10 Standing shoulder flexion 5# 3 x 10 Standing shoulder abduction 5# 3 x 10 Standing shoulder press 10# KB bell up 3 x 10 Tricep dip 2 x 10   07/23/23 Quadruped shoulder flexion stretch 5 x 10 second holds Standing shoulder flexion with perpendicular resistance BTB 2 x 10 Standing shoulder abduction with perpendicular resistance BTB 2 x 10 Standing shoulder flexion 5# 2 x 10 Standing  shoulder abduction 5# 2 x 10 Prone row into ER 1x 5 - discontinued due to impaired mechanics Manual: grade II-III AP glide in progressive ER at 90 abduction Standing ER at 90 abduction RTB 2 x 10 Standing shoulder press 5# KB bell up 3 x 10 Standing shoulder flexion overhead RTB 3 x 10 Bicep curls 10# 3 x 10  07/18/23 Manual:Grade II-III inferior glide in progressive flexion/abduction; TPR subscap Supine shoulder flexion 3# 2 x 10 Quadruped shoulder flexion stretch 5 x 10 second holds Standing shoulder flexion 3# 2 x 10 Standing shoulder abduction 3# 2 x 10 Standing shoulder  press 5# 3 x 10 High plank on knees with shoulder taps 2 x 10 Standing ER at 90 abduction RTB 2 x 10      PATIENT EDUCATION: Education details: Pt education on PT impairments, prognosis, and POC. Informed consent. Rationale for interventions, safe/appropriate HEP performance, post op precautions and self care as above 07/18/23: HEP 08/06/23 reassessment findings, HEP, POC Person educated: Patient Education method: Explanation, Demonstration, Tactile cues, Verbal cues Education comprehension: verbalized understanding, returned demonstration, verbal cues required, tactile cues required, and needs further education    HOME EXERCISE PROGRAM: Access Code: [email or web address] URL: [email or web address] Date: 05/09/2023 Prepared by: Alm Jenny  Exercises - Seated Elbow Flexion AAROM  - 2-3 x daily - 7 x weekly - 1 sets - 10 reps - Seated AAROM Elbow Supination/Pronation with Clasped Hands  - 2-3 x daily - 7 x weekly - 1 sets - 10 reps - Seated Gripping Towel  - 2-3 x daily - 7 x weekly - 1 sets - 10 reps  ASSESSMENT:  CLINICAL IMPRESSION: Manual for improving IR ROM at 90 abduction with ROM improving from 40 to 60 following. Intermittent shoulder symptoms with closed chain stability and strengthening exercises. Hyperactive and tender pec which improves some with manual and stretches, symptoms less with Y  balance following. Patient will continue to benefit from physical therapy in order to improve function and reduce impairment.      OBJECTIVE IMPAIRMENTS: decreased activity tolerance, decreased mobility, decreased ROM, decreased strength, impaired perceived functional ability, impaired UE functional use, postural dysfunction, and pain.   ACTIVITY LIMITATIONS: carrying, lifting, sleeping, bathing, dressing, reach over head, and hygiene/grooming  PARTICIPATION LIMITATIONS: meal prep, cleaning, laundry, driving, community activity, and occupation  PERSONAL FACTORS: Age and Time since onset of injury/illness/exacerbation are also affecting patient's functional outcome.   REHAB POTENTIAL: Good  CLINICAL DECISION MAKING: Stable/uncomplicated  EVALUATION COMPLEXITY: Low   GOALS:   SHORT TERM GOALS: Target date: 06/20/2023   Pt will demonstrate appropriate understanding and performance of initially prescribed HEP in order to facilitate improved independence with management of symptoms.  Baseline: HEP established  Goal status: MET 5/15   2. Pt will report at least 25% improvement in overall pain levels over past week in order to facilitate improved tolerance to typical daily activities.   Baseline: 1-7/10  Goal status: MET  LONG TERM GOALS: Target date: 08/01/2023 Pt will score less than or equal to 40% on Quick DASH in order to indicate reduced levels of disability due to shoulder pain (MDC 16-20pts).  Baseline: 75% 08/06/23 13.6%  Goal status: INITIAL  2.  Pt will demonstrate at least 150 degrees of active shoulder elevation on surgical limb in order to demonstrate improved tolerance to functional movement patterns such as reaching overhead.  Baseline: see ROM chart above Goal status: MET  3.  Pt will demonstrate at least 4+/5 shoulder flex/abd MMT for improved symmetry of UE strength and improved tolerance to functional movements.  Baseline: deferred on eval given surgical  protocol Goal status: MET  4. Pt will report ability to perform upper body dressing w/ less than 2 pt increase in pain in order to facilitate improved tolerance to ADLs.  Baseline: 1-7/10 pain with usual activities, inc time/difficulty performing in clinic  Goal status: MET   5. Pt will report at least 50% decrease in overall pain levels in past week in order to facilitate improved tolerance to basic ADLs/mobility.   Baseline: 1-7/10  Goal status: MET  6. Pt will endorse ability  to perform usual household tasks with no more than 2 pt increase in pain in order to return to PLOF.   Baseline: housework limited as expected post op  Goal status: MET  7. Patient will demonstrate grade of 5/5 MMT grade in all tested musculature as evidence of improved strength to assist with lifting at home and facilitate return to MMA.   Baseline:   Goal status: INITIAL 8. Patient will be able to return to all activities unrestricted for improved ability to perform work functions and recreation.  Baseline:   Goal status: INITIAL  PLAN:  PT FREQUENCY: 1-2x/week  PT DURATION: 6 weeks  PLANNED INTERVENTIONS: 02835- PT Re-evaluation, 97110-Therapeutic exercises, 97530- Therapeutic activity, 97112- Neuromuscular re-education, 97535- Self Care, 02859- Manual therapy, [id number]- Electrical stimulation (unattended), Patient/Family education, Taping, Dry Needling, Joint mobilization, Spinal mobilization, Scar mobilization, Cryotherapy, and Moist heat  PLAN FOR NEXT SESSION: continue to progress as able/appropriate per B[facility]shan shoulder labral repair protocol   Prentice GORMAN Stains, PT, DPT 08/20/2023, 7:58 AM

## 2023-08-24 NOTE — Therapy (Deleted)
 OUTPATIENT PHYSICAL THERAPY SHOULDER TREATMENT   Patient Name: Seth Shields MRN: [id number] DOB:Sep 19, 1990, 33 y.o., male Today's Date: 08/24/2023    END OF SESSION:       Past Medical History:  Diagnosis Date   BPH (benign prostatic hyperplasia)    Complication of anesthesia    woke up aggressive after hernia surgery   Inguinal hernia    Labral tear of shoulder    Past Surgical History:  Procedure Laterality Date   HERNIA REPAIR     SHOULDER ARTHROSCOPY WITH LABRAL REPAIR Left 05/07/2023   Procedure: ARTHROSCOPY, SHOULDER, WITH GLENOID LABRUM REPAIR;  Surgeon: Genelle Standing, MD;  Location: [facility];  Service: Orthopedics;  Laterality: Left;   Patient Active Problem List   Diagnosis Date Noted   Instability of left shoulder joint 05/07/2023   Chronic left shoulder pain 03/26/2023   Solitary pulmonary nodule 10/02/2018   Dyspnea 10/02/2018   Scoliosis deformity of spine 09/21/2017    PCP: Job Lukes, PA  REFERRING PROVIDER: Genelle Standing, MD  REFERRING DIAG: M25.312 (ICD-10-CM) - Instability of left shoulder joint  THERAPY DIAG:  No diagnosis found.  Rationale for Evaluation and Treatment: Rehabilitation  ONSET DATE: L shoulder labral repair 05/07/23  SUBJECTIVE:                                                                                                                                                                                      SUBJECTIVE STATEMENT: Patient states was sore after last session. Doing HEP and went to gym once. Tripped and used L arm to brace and it hurt shoulder quite a bit. IR motion at 90 abduction is still limited.    Hand dominance: Right  PERTINENT HISTORY: BPH, L labral repair DOS 05/07/23  PAIN:  Are you having pain: 0/10 stiff Location/description: L anterolateral shoulder  Best-worst over past week: 1/10  - aggravating factors: rolling on shoulder when sleeping - Easing factors: medication,  icing    PRECAUTIONS: L shoulder Bokshan labral repair protocol  RED FLAGS: None   WEIGHT BEARING RESTRICTIONS: Yes NWB  FALLS:  Has patient fallen in last 6 months? No  LIVING ENVIRONMENT: 1 story home + basement w/ spouse who works  Pt usually does most of cooking, otherwise split housework Has two dogs, beagle and mini aussie   OCCUPATION: Attorney full time  PLOF: Independent - enjoys MMA, going to gym, walk dogs  PATIENT GOALS: get back to PLOF, full mobility, get back in gym   NEXT MD VISIT: 05/29/23  OBJECTIVE:  Note: Objective measures were completed at Evaluation unless otherwise noted.  DIAGNOSTIC FINDINGS:  DOS 05/07/23 labral repair L shoulder  PATIENT SURVEYS:  [name] 75% 08/06/23 13.6%  COGNITION: Overall cognitive status: Within functional limits for tasks assessed     SENSATION: Denies sensory complaints today  POSTURE: Sling donned ; guarded LUE posture as expected post op  UPPER EXTREMITY ROM:  Passive ROM  Right eval Left eval Left AROM 06/06/23 Left AROM 08/06/23  Shoulder flexion  P: <25 deg limited by muscle guarding 130 improves to 150 154 AAROM 160  Shoulder abduction  P: ~30 deg limited by muscle guarding    156  Shoulder internal rotation      Shoulder external rotation (at side unless otherwise noted)    55  Elbow flexion      Elbow extension      Wrist flexion      Wrist extension       (Blank rows = not tested) (Key: WFL = within functional limits not formally assessed, * = concordant pain, s = stiffness/stretching sensation, NT = not tested)  Comments:   UPPER EXTREMITY MMT:  MMT Right eval Left eval Right 08/06/23 Left 08/06/23  Shoulder flexion   5 5  Shoulder extension      Shoulder abduction   5 5  Shoulder adduction      Shoulder internal rotation   5 5  Shoulder external rotation   5 4+  Middle trapezius      Lower trapezius      Elbow flexion   5 4+  Elbow extension   5 5  Wrist flexion      Wrist extension       Wrist ulnar deviation      Wrist radial deviation      Wrist pronation      Wrist supination      Grip strength (lbs)      (Blank rows = not tested) (Key: WFL = within functional limits not formally assessed, * = concordant pain, s = stiffness/stretching sensation, NT = not tested)  Comments: deferred on eval given post op protocol and acuity of surgery   PALPATION/OBSERVATION:  3 portal incisions (anterior, lateral, and posterior) appear grossly WNL, mild bleeding apparent anterior incision but not excessive. No significant erythema or swelling apparent, no excessive warmth.  Able to doff sling w/o assist, inc time/effort and guarding noted of LUE Min assist to don sling Able to perform upper body dressing for wound inspection w/o assist, inc time/effort noted and guarding LUE                                                                                                                              TREATMENT DATE:  08/20/23 UBE retro 3 minutes, fwd 3 minutes level 4 Manual: grade III AP glides in progressive IR, Prone IR mobs grade III, STM to pec Standing ER at 90 abduction BTB 3 x 10 Standing IR at 90 abduction Black TB 3 x 10 High plank weight passes 10# 2 x 10 UE Y  balance x 5  Supine pec stretch on 1/2 foam roll 3 x 30 second holds UE Y balance x 2  08/16/23 UBE retro 2 minutes, fwd 2 minutes level 4 Weighted  ball tosses 2kg 5 x 20 Weighted ball toss and catch 3 x 10 Plank on bosu with weight shifts 3 x 10 Plank on bosu with mountain climbers 3 x 10 Side plank with rotation 3 x 10  08/13/23 UBE retro 2 minutes, fwd 2 minutes level 4 Weighted  ball tosses 2kg 5 x 20 Seated arnold press 25# 1 x 10, 20# 2x10  Reverse fly on roman chair 10# 3 x 10 Plank on bosu 3 x 30 second holds High plank shoulder taps 2 x 10  High plank weight passes 10# 2 x 10   08/08/23 UBE retro 2 minutes, fwd 2 minutes level 4 Lat pull down 75# 1 x 10, 100# 2 x 10 Weighted  ball tosses  1kg 2 x 20, 2kg 4 x 20 Reverse fly on incline bench 10# 3 x 10 Bicep curl 25# 2 x 8 High plank into downward dog 2 x 10    08/06/23 Reassessment and discussion of POC UBE retro 2 minutes, fwd 2 minutes level 4 Standing ER at 90 abduction GTB 3 x 10 Standing IR at 90 abduction GTB 3 x 10 High Row Black TB 3 x 10 Body blade: flexion, abduction, horizontal abduction, ER/IR neutral 2 x 30 seconds each Standing shoulder press 15# KB bell up 3 x 10  07/30/23 UBE retro 2 minutes, fwd 2 minutes Standing shoulder flexion with perpendicular resistance BTB 2 x 10 Standing shoulder abduction with perpendicular resistance BTB 2 x 10 Body blade: flexion, abduction, horizontal abduction, ER/IR neutral 3 x 30 seconds each Standing ER at 90 abduction RTB 3 x 10 Standing IR at 90 abduction RTB 3 x 10 Standing shoulder press 10# KB bell up 3 x 10  07/25/23 Quadruped shoulder flexion 2 x 10 Standing shoulder flexion with perpendicular resistance BTB 2 x 10 Standing shoulder abduction with perpendicular resistance BTB 2 x 10 Standing ER at 90 abduction RTB 3 x 10 Standing shoulder flexion 5# 3 x 10 Standing shoulder abduction 5# 3 x 10 Standing shoulder press 10# KB bell up 3 x 10 Tricep dip 2 x 10   07/23/23 Quadruped shoulder flexion stretch 5 x 10 second holds Standing shoulder flexion with perpendicular resistance BTB 2 x 10 Standing shoulder abduction with perpendicular resistance BTB 2 x 10 Standing shoulder flexion 5# 2 x 10 Standing shoulder abduction 5# 2 x 10 Prone row into ER 1x 5 - discontinued due to impaired mechanics Manual: grade II-III AP glide in progressive ER at 90 abduction Standing ER at 90 abduction RTB 2 x 10 Standing shoulder press 5# KB bell up 3 x 10 Standing shoulder flexion overhead RTB 3 x 10 Bicep curls 10# 3 x 10  07/18/23 Manual:Grade II-III inferior glide in progressive flexion/abduction; TPR subscap Supine shoulder flexion 3# 2 x 10 Quadruped shoulder flexion  stretch 5 x 10 second holds Standing shoulder flexion 3# 2 x 10 Standing shoulder abduction 3# 2 x 10 Standing shoulder press 5# 3 x 10 High plank on knees with shoulder taps 2 x 10 Standing ER at 90 abduction RTB 2 x 10      PATIENT EDUCATION: Education details: Pt education on PT impairments, prognosis, and POC. Informed consent. Rationale for interventions, safe/appropriate HEP performance, post op precautions and self care as above 07/18/23:  HEP 08/06/23 reassessment findings, HEP, POC Person educated: Patient Education method: Explanation, Demonstration, Tactile cues, Verbal cues Education comprehension: verbalized understanding, returned demonstration, verbal cues required, tactile cues required, and needs further education    HOME EXERCISE PROGRAM: Access Code: [email or web address] URL: [email or web address] Date: 05/09/2023 Prepared by: Alm Jenny  Exercises - Seated Elbow Flexion AAROM  - 2-3 x daily - 7 x weekly - 1 sets - 10 reps - Seated AAROM Elbow Supination/Pronation with Clasped Hands  - 2-3 x daily - 7 x weekly - 1 sets - 10 reps - Seated Gripping Towel  - 2-3 x daily - 7 x weekly - 1 sets - 10 reps  ASSESSMENT:  CLINICAL IMPRESSION: Manual for improving IR ROM at 90 abduction with ROM improving from 40 to 60 following. Intermittent shoulder symptoms with closed chain stability and strengthening exercises. Hyperactive and tender pec which improves some with manual and stretches, symptoms less with Y balance following. Patient will continue to benefit from physical therapy in order to improve function and reduce impairment.      OBJECTIVE IMPAIRMENTS: decreased activity tolerance, decreased mobility, decreased ROM, decreased strength, impaired perceived functional ability, impaired UE functional use, postural dysfunction, and pain.   ACTIVITY LIMITATIONS: carrying, lifting, sleeping, bathing, dressing, reach over head, and hygiene/grooming  PARTICIPATION  LIMITATIONS: meal prep, cleaning, laundry, driving, community activity, and occupation  PERSONAL FACTORS: Age and Time since onset of injury/illness/exacerbation are also affecting patient's functional outcome.   REHAB POTENTIAL: Good  CLINICAL DECISION MAKING: Stable/uncomplicated  EVALUATION COMPLEXITY: Low   GOALS:   SHORT TERM GOALS: Target date: 06/20/2023   Pt will demonstrate appropriate understanding and performance of initially prescribed HEP in order to facilitate improved independence with management of symptoms.  Baseline: HEP established  Goal status: MET 5/15   2. Pt will report at least 25% improvement in overall pain levels over past week in order to facilitate improved tolerance to typical daily activities.   Baseline: 1-7/10  Goal status: MET  LONG TERM GOALS: Target date: 08/01/2023 Pt will score less than or equal to 40% on Quick DASH in order to indicate reduced levels of disability due to shoulder pain (MDC 16-20pts).  Baseline: 75% 08/06/23 13.6%  Goal status: INITIAL  2.  Pt will demonstrate at least 150 degrees of active shoulder elevation on surgical limb in order to demonstrate improved tolerance to functional movement patterns such as reaching overhead.  Baseline: see ROM chart above Goal status: MET  3.  Pt will demonstrate at least 4+/5 shoulder flex/abd MMT for improved symmetry of UE strength and improved tolerance to functional movements.  Baseline: deferred on eval given surgical protocol Goal status: MET  4. Pt will report ability to perform upper body dressing w/ less than 2 pt increase in pain in order to facilitate improved tolerance to ADLs.  Baseline: 1-7/10 pain with usual activities, inc time/difficulty performing in clinic  Goal status: MET   5. Pt will report at least 50% decrease in overall pain levels in past week in order to facilitate improved tolerance to basic ADLs/mobility.   Baseline: 1-7/10  Goal status: MET  6. Pt will  endorse ability to perform usual household tasks with no more than 2 pt increase in pain in order to return to PLOF.   Baseline: housework limited as expected post op  Goal status: MET  7. Patient will demonstrate grade of 5/5 MMT grade in all tested musculature as evidence of improved strength to assist with lifting at home  and facilitate return to MMA.   Baseline:   Goal status: INITIAL 8. Patient will be able to return to all activities unrestricted for improved ability to perform work functions and recreation.  Baseline:   Goal status: INITIAL  PLAN:  PT FREQUENCY: 1-2x/week  PT DURATION: 6 weeks  PLANNED INTERVENTIONS: 02835- PT Re-evaluation, 97110-Therapeutic exercises, 97530- Therapeutic activity, 97112- Neuromuscular re-education, 97535- Self Care, [id number]- Manual therapy, [id number]- Electrical stimulation (unattended), Patient/Family education, Taping, Dry Needling, Joint mobilization, Spinal mobilization, Scar mobilization, Cryotherapy, and Moist heat  PLAN FOR NEXT SESSION: continue to progress as able/appropriate per Bokshan shoulder labral repair protocol   Rojean JONELLE Batten, PT, DPT 08/24/2023, 7:47 AM

## 2023-08-27 ENCOUNTER — Ambulatory Visit (HOSPITAL_BASED_OUTPATIENT_CLINIC_OR_DEPARTMENT_OTHER): Admitting: Physical Therapy

## 2023-09-05 ENCOUNTER — Ambulatory Visit (HOSPITAL_BASED_OUTPATIENT_CLINIC_OR_DEPARTMENT_OTHER): Admitting: Physical Therapy

## 2023-09-05 ENCOUNTER — Telehealth (HOSPITAL_BASED_OUTPATIENT_CLINIC_OR_DEPARTMENT_OTHER): Payer: Self-pay | Admitting: Physical Therapy

## 2023-09-05 NOTE — Telephone Encounter (Signed)
 Patient no show, left message for patient to make him aware of missed appointment and reminded him of his next appointment.  9:16 AM, 09/05/23 Prentice CANDIE Stains PT, DPT Physical Therapist at [facility]

## 2023-09-10 ENCOUNTER — Ambulatory Visit (HOSPITAL_BASED_OUTPATIENT_CLINIC_OR_DEPARTMENT_OTHER): Attending: Orthopaedic Surgery | Admitting: Physical Therapy

## 2023-09-10 ENCOUNTER — Telehealth (HOSPITAL_BASED_OUTPATIENT_CLINIC_OR_DEPARTMENT_OTHER): Payer: Self-pay | Admitting: Physical Therapy

## 2023-09-10 DIAGNOSIS — M6281 Muscle weakness (generalized): Secondary | ICD-10-CM | POA: Insufficient documentation

## 2023-09-10 DIAGNOSIS — M25612 Stiffness of left shoulder, not elsewhere classified: Secondary | ICD-10-CM | POA: Insufficient documentation

## 2023-09-10 DIAGNOSIS — M25512 Pain in left shoulder: Secondary | ICD-10-CM | POA: Insufficient documentation

## 2023-09-10 NOTE — Telephone Encounter (Signed)
 Patient no show, spoke with patient regarding missed appointment and he stated he had a different time marked down for the appointment. Reminded of next appointment and informed patient of other openings for this week. Patient stated he will call back after he checks his schedule for the week.  7:46 AM, 09/10/23 Seth Shields Stains PT, DPT Physical Therapist at [facility]

## 2023-09-17 ENCOUNTER — Encounter (HOSPITAL_BASED_OUTPATIENT_CLINIC_OR_DEPARTMENT_OTHER): Payer: Self-pay | Admitting: Physical Therapy

## 2023-09-17 ENCOUNTER — Ambulatory Visit (HOSPITAL_BASED_OUTPATIENT_CLINIC_OR_DEPARTMENT_OTHER): Admitting: Physical Therapy

## 2023-09-17 DIAGNOSIS — M25512 Pain in left shoulder: Secondary | ICD-10-CM | POA: Diagnosis present

## 2023-09-17 DIAGNOSIS — M25612 Stiffness of left shoulder, not elsewhere classified: Secondary | ICD-10-CM | POA: Diagnosis present

## 2023-09-17 DIAGNOSIS — M6281 Muscle weakness (generalized): Secondary | ICD-10-CM

## 2023-09-17 NOTE — Therapy (Signed)
 OUTPATIENT PHYSICAL THERAPY SHOULDER TREATMENT   Patient Name: Seth Shields MRN: [id number] DOB:11/13/1990, 33 y.o., male Today's Date: 09/17/2023  PHYSICAL THERAPY DISCHARGE SUMMARY  Visits from Start of Care: 24  Current functional level related to goals / functional outcomes: See below   Remaining deficits: See below   Education / Equipment: See below   Patient agrees to discharge. Patient goals were met. Patient is being discharged due to being pleased with the current functional level.   Progress Note   Reporting Period 08/06/23 to 09/17/23   See note below for Objective Data and Assessment of Progress/Goals   END OF SESSION:  PT End of Session - 09/17/23 0719     Visit Number 24    Number of Visits 31    Date for PT Re-Evaluation 09/17/23    Authorization Type BCBS    PT Start Time 0717    PT Stop Time 0750    PT Time Calculation (min) 33 min    Activity Tolerance Patient tolerated treatment well    Behavior During Therapy WFL for tasks assessed/performed             Past Medical History:  Diagnosis Date   BPH (benign prostatic hyperplasia)    Complication of anesthesia    woke up aggressive after hernia surgery   Inguinal hernia    Labral tear of shoulder    Past Surgical History:  Procedure Laterality Date   HERNIA REPAIR     SHOULDER ARTHROSCOPY WITH LABRAL REPAIR Left 05/07/2023   Procedure: ARTHROSCOPY, SHOULDER, WITH GLENOID LABRUM REPAIR;  Surgeon: Genelle Standing, MD;  Location: [facility];  Service: Orthopedics;  Laterality: Left;   Patient Active Problem List   Diagnosis Date Noted   Instability of left shoulder joint 05/07/2023   Chronic left shoulder pain 03/26/2023   Solitary pulmonary nodule 10/02/2018   Dyspnea 10/02/2018   Scoliosis deformity of spine 09/21/2017    PCP: Job Lukes, PA  REFERRING PROVIDER: Genelle Standing, MD  REFERRING DIAG: [phone] (ICD-10-CM) - Instability of left shoulder  joint  THERAPY DIAG:  Left shoulder pain, unspecified chronicity  Stiffness of left shoulder, not elsewhere classified  Muscle weakness (generalized)  Rationale for Evaluation and Treatment: Rehabilitation  ONSET DATE: L shoulder labral repair 05/07/23  SUBJECTIVE:                                                                                                                                                                                      SUBJECTIVE STATEMENT: Patient states shoulder has been feeling pretty good, gets some soreness in infraspinatus region. Gets some popping as he loosens up. Pec also gets  tight, trying to massage it out. Patient states 85-90% functional status. Doing HEP. Has been working on punching movement, shadow boxing, light impact on LLE. Jabs etc are going alright, crosses/hook and upper cuts are painful/troublesome still. Feels like he has the tools to continue independently. Hasn't tried a pull up yet. Closed chain exercises are getting better.    Hand dominance: Right  PERTINENT HISTORY: BPH, L labral repair DOS 05/07/23  PAIN:  Are you having pain: 0/10 stiff Location/description: L anterolateral shoulder  Best-worst over past week: 1/10  - aggravating factors: rolling on shoulder when sleeping - Easing factors: medication, icing    PRECAUTIONS: L shoulder [place] labral repair protocol  RED FLAGS: None   WEIGHT BEARING RESTRICTIONS: Yes NWB  FALLS:  Has patient fallen in last 6 months? No  LIVING ENVIRONMENT: 1 story home + basement w/ spouse who works  Pt usually does most of cooking, otherwise split housework Has two dogs, beagle and mini aussie   OCCUPATION: Attorney full time  PLOF: Independent - enjoys MMA, going to gym, walk dogs  PATIENT GOALS: get back to PLOF, full mobility, get back in gym   NEXT MD VISIT: 05/29/23  OBJECTIVE:  Note: Objective measures were completed at Evaluation unless otherwise noted.  DIAGNOSTIC  FINDINGS:  DOS 05/07/23 labral repair L shoulder  PATIENT SURVEYS:   Quick Dash 75% 08/06/23 13.6% 09/17/23: 9 %   COGNITION: Overall cognitive status: Within functional limits for tasks assessed     SENSATION: Denies sensory complaints today  POSTURE: Sling donned ; guarded LUE posture as expected post op  UPPER EXTREMITY ROM:  Passive ROM  Right eval Left eval Left AROM 06/06/23 Left AROM 08/06/23  Shoulder flexion  P: <25 deg limited by muscle guarding 130 improves to 150 154 AAROM 160  Shoulder abduction  P: ~30 deg limited by muscle guarding    156  Shoulder internal rotation      Shoulder external rotation (at side unless otherwise noted)    55  Elbow flexion      Elbow extension      Wrist flexion      Wrist extension       (Blank rows = not tested) (Key: WFL = within functional limits not formally assessed, * = concordant pain, s = stiffness/stretching sensation, NT = not tested)  Comments:   UPPER EXTREMITY MMT:  MMT Right eval Left eval Right 08/06/23 Left 08/06/23 Right 09/17/23 Left 09/17/23  Shoulder flexion   5 5 5 5   Shoulder extension        Shoulder abduction   5 5 5 5   Shoulder adduction        Shoulder internal rotation   5 5 5 5   Shoulder external rotation   5 4+ 5 4+  Middle trapezius        Lower trapezius        Elbow flexion   5 4+ 5 5  Elbow extension   5 5 5 5   Wrist flexion        Wrist extension        Wrist ulnar deviation        Wrist radial deviation        Wrist pronation        Wrist supination        Grip strength (lbs)        (Blank rows = not tested) (Key: WFL = within functional limits not formally assessed, * = concordant pain, s =  stiffness/stretching sensation, NT = not tested)  Comments: deferred on eval given post op protocol and acuity of surgery   PALPATION/OBSERVATION:  3 portal incisions (anterior, lateral, and posterior) appear grossly WNL, mild bleeding apparent anterior incision but not excessive. No significant  erythema or swelling apparent, no excessive warmth.  Able to doff sling w/o assist, inc time/effort and guarding noted of LUE Min assist to don sling Able to perform upper body dressing for wound inspection w/o assist, inc time/effort noted and guarding LUE  09/17/23: TTP/trigger points infraspinatus, teres minor                                                                                                                             TREATMENT DATE:  09/17/23 UBE retro 3 minutes, fwd 3 minutes level 4 Reassessment Standing ER at 90 abduction BTB 1 x 10  08/20/23 UBE retro 3 minutes, fwd 3 minutes level 4 Manual: grade III AP glides in progressive IR, Prone IR mobs grade III, STM to pec Standing ER at 90 abduction BTB 3 x 10 Standing IR at 90 abduction Black TB 3 x 10 High plank weight passes 10# 2 x 10 UE Y balance x 5  Supine pec stretch on 1/2 foam roll 3 x 30 second holds UE Y balance x 2  08/16/23 UBE retro 2 minutes, fwd 2 minutes level 4 Weighted  ball tosses 2kg 5 x 20 Weighted ball toss and catch 3 x 10 Plank on bosu with weight shifts 3 x 10 Plank on bosu with mountain climbers 3 x 10 Side plank with rotation 3 x 10  08/13/23 UBE retro 2 minutes, fwd 2 minutes level 4 Weighted  ball tosses 2kg 5 x 20 Seated arnold press 25# 1 x 10, 20# 2x10  Reverse fly on roman chair 10# 3 x 10 Plank on bosu 3 x 30 second holds High plank shoulder taps 2 x 10  High plank weight passes 10# 2 x 10   08/08/23 UBE retro 2 minutes, fwd 2 minutes level 4 Lat pull down 75# 1 x 10, 100# 2 x 10 Weighted  ball tosses 1kg 2 x 20, 2kg 4 x 20 Reverse fly on incline bench 10# 3 x 10 Bicep curl 25# 2 x 8 High plank into downward dog 2 x 10    08/06/23 Reassessment and discussion of POC UBE retro 2 minutes, fwd 2 minutes level 4 Standing ER at 90 abduction GTB 3 x 10 Standing IR at 90 abduction GTB 3 x 10 High Row Black TB 3 x 10 Body blade: flexion, abduction, horizontal abduction, ER/IR  neutral 2 x 30 seconds each Standing shoulder press 15# KB bell up 3 x 10  07/30/23 UBE retro 2 minutes, fwd 2 minutes Standing shoulder flexion with perpendicular resistance BTB 2 x 10 Standing shoulder abduction with perpendicular resistance BTB 2 x 10 Body blade: flexion, abduction, horizontal abduction, ER/IR neutral 3 x 30 seconds each Standing  ER at 90 abduction RTB 3 x 10 Standing IR at 90 abduction RTB 3 x 10 Standing shoulder press 10# KB bell up 3 x 10  07/25/23 Quadruped shoulder flexion 2 x 10 Standing shoulder flexion with perpendicular resistance BTB 2 x 10 Standing shoulder abduction with perpendicular resistance BTB 2 x 10 Standing ER at 90 abduction RTB 3 x 10 Standing shoulder flexion 5# 3 x 10 Standing shoulder abduction 5# 3 x 10 Standing shoulder press 10# KB bell up 3 x 10 Tricep dip 2 x 10   07/23/23 Quadruped shoulder flexion stretch 5 x 10 second holds Standing shoulder flexion with perpendicular resistance BTB 2 x 10 Standing shoulder abduction with perpendicular resistance BTB 2 x 10 Standing shoulder flexion 5# 2 x 10 Standing shoulder abduction 5# 2 x 10 Prone row into ER 1x 5 - discontinued due to impaired mechanics Manual: grade II-III AP glide in progressive ER at 90 abduction Standing ER at 90 abduction RTB 2 x 10 Standing shoulder press 5# KB bell up 3 x 10 Standing shoulder flexion overhead RTB 3 x 10 Bicep curls 10# 3 x 10  07/18/23 Manual:Grade II-III inferior glide in progressive flexion/abduction; TPR subscap Supine shoulder flexion 3# 2 x 10 Quadruped shoulder flexion stretch 5 x 10 second holds Standing shoulder flexion 3# 2 x 10 Standing shoulder abduction 3# 2 x 10 Standing shoulder press 5# 3 x 10 High plank on knees with shoulder taps 2 x 10 Standing ER at 90 abduction RTB 2 x 10      PATIENT EDUCATION: Education details: Pt education on PT impairments, prognosis, and POC. Informed consent. Rationale for interventions,  safe/appropriate HEP performance, post op precautions and self care as above 07/18/23: HEP 08/06/23 reassessment findings, HEP, POC 09/17/23: HEP, reassessment findings, relevant anatomy and biomechanics Person educated: Patient Education method: Explanation, Demonstration, Tactile cues, Verbal cues Education comprehension: verbalized understanding, returned demonstration, verbal cues required, tactile cues required, and needs further education    HOME EXERCISE PROGRAM: Access Code: [email or web address] URL: [email or web address] Date: 05/09/2023 Prepared by: Alm Jenny  Exercises - Seated Elbow Flexion AAROM  - 2-3 x daily - 7 x weekly - 1 sets - 10 reps - Seated AAROM Elbow Supination/Pronation with Clasped Hands  - 2-3 x daily - 7 x weekly - 1 sets - 10 reps - Seated Gripping Towel  - 2-3 x daily - 7 x weekly - 1 sets - 10 reps  ASSESSMENT:  CLINICAL IMPRESSION: Patient has met 2/2 short term goals and 6/8 long term goals with ability to complete HEP and improvement in symptoms, strength, ROM, activity tolerance, and functional mobility. Remaining deficits are in strength and return to full activity such as MMA exercise/activity although much improved. Remaining 2 goals partially met. Discussed reassessment findings, HEP, returning to PT if needed. Patient discharged from PT.     OBJECTIVE IMPAIRMENTS: decreased activity tolerance, decreased mobility, decreased ROM, decreased strength, impaired perceived functional ability, impaired UE functional use, postural dysfunction, and pain.   ACTIVITY LIMITATIONS: carrying, lifting, sleeping, bathing, dressing, reach over head, and hygiene/grooming  PARTICIPATION LIMITATIONS: meal prep, cleaning, laundry, driving, community activity, and occupation  PERSONAL FACTORS: Age and Time since onset of injury/illness/exacerbation are also affecting patient's functional outcome.   REHAB POTENTIAL: Good  CLINICAL DECISION MAKING:  Stable/uncomplicated  EVALUATION COMPLEXITY: Low   GOALS:   SHORT TERM GOALS: Target date: 06/20/2023   Pt will demonstrate appropriate understanding and performance of initially prescribed HEP in order to facilitate improved  independence with management of symptoms.  Baseline: HEP established  Goal status: MET 5/15   2. Pt will report at least 25% improvement in overall pain levels over past week in order to facilitate improved tolerance to typical daily activities.   Baseline: 1-7/10  Goal status: MET  LONG TERM GOALS: Target date: 08/01/2023 Pt will score less than or equal to 40% on Quick DASH in order to indicate reduced levels of disability due to shoulder pain (MDC 16-20pts).  Baseline: 75% 08/06/23 13.6% 09/17/23: 9%  Goal status: MET  2.  Pt will demonstrate at least 150 degrees of active shoulder elevation on surgical limb in order to demonstrate improved tolerance to functional movement patterns such as reaching overhead.  Baseline: see ROM chart above Goal status: MET  3.  Pt will demonstrate at least 4+/5 shoulder flex/abd MMT for improved symmetry of UE strength and improved tolerance to functional movements.  Baseline: deferred on eval given surgical protocol Goal status: MET  4. Pt will report ability to perform upper body dressing w/ less than 2 pt increase in pain in order to facilitate improved tolerance to ADLs.  Baseline: 1-7/10 pain with usual activities, inc time/difficulty performing in clinic  Goal status: MET   5. Pt will report at least 50% decrease in overall pain levels in past week in order to facilitate improved tolerance to basic ADLs/mobility.   Baseline: 1-7/10  Goal status: MET  6. Pt will endorse ability to perform usual household tasks with no more than 2 pt increase in pain in order to return to PLOF.   Baseline: housework limited as expected post op  Goal status: MET  7. Patient will demonstrate grade of 5/5 MMT grade in all tested  musculature as evidence of improved strength to assist with lifting at home and facilitate return to MMA.   Baseline:   Goal status: partially met - lacking ER at 4+/5 8. Patient will be able to return to all activities unrestricted for improved ability to perform work functions and recreation.  Baseline:   Goal status: Partially met - somewhat limited with MMA exercise/activity  PLAN:  PT FREQUENCY: 1-2x/week  PT DURATION: 6 weeks  PLANNED INTERVENTIONS: 97164- PT Re-evaluation, 97110-Therapeutic exercises, 97530- Therapeutic activity, 97112- Neuromuscular re-education, 97535- Self Care, [id number]- Manual therapy, [id number]- Electrical stimulation (unattended), Patient/Family education, Taping, Dry Needling, Joint mobilization, Spinal mobilization, Scar mobilization, Cryotherapy, and Moist heat  PLAN FOR NEXT SESSION:n/a   Prentice GORMAN Stains, PT, DPT 09/17/2023, 7:50 AM

## 2023-09-24 ENCOUNTER — Encounter (HOSPITAL_BASED_OUTPATIENT_CLINIC_OR_DEPARTMENT_OTHER): Admitting: Physical Therapy

## 2023-10-01 ENCOUNTER — Encounter (HOSPITAL_BASED_OUTPATIENT_CLINIC_OR_DEPARTMENT_OTHER): Admitting: Physical Therapy

## 2023-10-08 ENCOUNTER — Encounter: Payer: Self-pay | Admitting: Sports Medicine

## 2023-12-09 ENCOUNTER — Encounter: Payer: Self-pay | Admitting: Radiology

## 2024-02-19 ENCOUNTER — Ambulatory Visit: Admitting: Physician Assistant
